# Patient Record
Sex: Female | Born: 1935
Health system: Southern US, Community
[De-identification: ages and names within clinical notes are randomized; demographics above are authoritative.]

## PROBLEM LIST (undated history)

## (undated) DIAGNOSIS — E7800 Pure hypercholesterolemia, unspecified: Secondary | ICD-10-CM

## (undated) DIAGNOSIS — C801 Malignant (primary) neoplasm, unspecified: Secondary | ICD-10-CM

## (undated) DIAGNOSIS — E559 Vitamin D deficiency, unspecified: Secondary | ICD-10-CM

## (undated) DIAGNOSIS — H3530 Unspecified macular degeneration: Secondary | ICD-10-CM

## (undated) DIAGNOSIS — Z87898 Personal history of other specified conditions: Secondary | ICD-10-CM

## (undated) DIAGNOSIS — IMO0001 Reserved for inherently not codable concepts without codable children: Secondary | ICD-10-CM

## (undated) DIAGNOSIS — H9190 Unspecified hearing loss, unspecified ear: Secondary | ICD-10-CM

## (undated) DIAGNOSIS — M810 Age-related osteoporosis without current pathological fracture: Secondary | ICD-10-CM

## (undated) DIAGNOSIS — M1990 Unspecified osteoarthritis, unspecified site: Secondary | ICD-10-CM

## (undated) HISTORY — DX: Unspecified macular degeneration: H35.30

## (undated) HISTORY — DX: Pure hypercholesterolemia, unspecified: E78.00

## (undated) HISTORY — DX: Vitamin D deficiency, unspecified: E55.9

## (undated) HISTORY — PX: OTHER SURGICAL HISTORY: SHX169

---

## 1962-05-27 HISTORY — PX: APPENDECTOMY: SHX54

## 1969-05-27 HISTORY — PX: ABDOMINAL HYSTERECTOMY: SHX81

## 1991-05-28 HISTORY — PX: BACK SURGERY: SHX140

## 1998-03-02 ENCOUNTER — Other Ambulatory Visit: Admission: RE | Admit: 1998-03-02 | Discharge: 1998-03-02 | Payer: Self-pay | Admitting: Obstetrics and Gynecology

## 1999-03-14 ENCOUNTER — Other Ambulatory Visit: Admission: RE | Admit: 1999-03-14 | Discharge: 1999-03-14 | Payer: Self-pay | Admitting: Obstetrics and Gynecology

## 2000-01-02 ENCOUNTER — Encounter: Admission: RE | Admit: 2000-01-02 | Discharge: 2000-01-02 | Payer: Self-pay | Admitting: Obstetrics and Gynecology

## 2000-01-02 ENCOUNTER — Encounter: Payer: Self-pay | Admitting: Obstetrics and Gynecology

## 2000-03-13 ENCOUNTER — Encounter: Admission: RE | Admit: 2000-03-13 | Discharge: 2000-03-13 | Payer: Self-pay | Admitting: Obstetrics and Gynecology

## 2000-03-13 ENCOUNTER — Other Ambulatory Visit: Admission: RE | Admit: 2000-03-13 | Discharge: 2000-03-13 | Payer: Self-pay | Admitting: Obstetrics and Gynecology

## 2000-03-13 ENCOUNTER — Encounter: Payer: Self-pay | Admitting: Obstetrics and Gynecology

## 2000-04-01 ENCOUNTER — Encounter: Payer: Self-pay | Admitting: Otolaryngology

## 2000-04-01 ENCOUNTER — Encounter: Admission: RE | Admit: 2000-04-01 | Discharge: 2000-04-01 | Payer: Self-pay | Admitting: Otolaryngology

## 2001-02-05 ENCOUNTER — Encounter: Admission: RE | Admit: 2001-02-05 | Discharge: 2001-02-05 | Payer: Self-pay | Admitting: Obstetrics and Gynecology

## 2001-02-05 ENCOUNTER — Encounter: Payer: Self-pay | Admitting: Obstetrics and Gynecology

## 2001-07-02 ENCOUNTER — Other Ambulatory Visit: Admission: RE | Admit: 2001-07-02 | Discharge: 2001-07-02 | Payer: Self-pay | Admitting: Gynecology

## 2002-02-02 ENCOUNTER — Encounter: Payer: Self-pay | Admitting: Gynecology

## 2002-02-02 ENCOUNTER — Encounter: Admission: RE | Admit: 2002-02-02 | Discharge: 2002-02-02 | Payer: Self-pay | Admitting: Gynecology

## 2002-12-02 ENCOUNTER — Encounter: Admission: RE | Admit: 2002-12-02 | Discharge: 2002-12-02 | Payer: Self-pay | Admitting: Gynecology

## 2002-12-02 ENCOUNTER — Encounter: Payer: Self-pay | Admitting: Gynecology

## 2003-03-07 ENCOUNTER — Encounter: Admission: RE | Admit: 2003-03-07 | Discharge: 2003-03-07 | Payer: Self-pay | Admitting: Gynecology

## 2003-03-07 ENCOUNTER — Encounter: Payer: Self-pay | Admitting: Gynecology

## 2003-08-23 ENCOUNTER — Encounter: Admission: RE | Admit: 2003-08-23 | Discharge: 2003-11-21 | Payer: Self-pay | Admitting: Cardiology

## 2004-03-23 ENCOUNTER — Encounter: Admission: RE | Admit: 2004-03-23 | Discharge: 2004-03-23 | Payer: Self-pay | Admitting: Gynecology

## 2004-10-03 ENCOUNTER — Other Ambulatory Visit: Admission: RE | Admit: 2004-10-03 | Discharge: 2004-10-03 | Payer: Self-pay | Admitting: Gynecology

## 2005-04-24 ENCOUNTER — Encounter: Payer: Self-pay | Admitting: Gynecology

## 2006-04-25 ENCOUNTER — Encounter: Admission: RE | Admit: 2006-04-25 | Discharge: 2006-04-25 | Payer: Self-pay | Admitting: Gynecology

## 2006-05-05 ENCOUNTER — Encounter: Admission: RE | Admit: 2006-05-05 | Discharge: 2006-05-05 | Payer: Self-pay | Admitting: Gynecology

## 2007-04-27 ENCOUNTER — Encounter: Admission: RE | Admit: 2007-04-27 | Discharge: 2007-04-27 | Payer: Self-pay | Admitting: Gynecology

## 2008-01-18 ENCOUNTER — Other Ambulatory Visit: Admission: RE | Admit: 2008-01-18 | Discharge: 2008-01-18 | Payer: Self-pay | Admitting: Gynecology

## 2008-01-21 ENCOUNTER — Encounter: Admission: RE | Admit: 2008-01-21 | Discharge: 2008-01-21 | Payer: Self-pay | Admitting: Gynecology

## 2008-05-03 ENCOUNTER — Encounter: Admission: RE | Admit: 2008-05-03 | Discharge: 2008-05-03 | Payer: Self-pay | Admitting: Gynecology

## 2008-05-12 ENCOUNTER — Encounter: Admission: RE | Admit: 2008-05-12 | Discharge: 2008-05-12 | Payer: Self-pay | Admitting: Gynecology

## 2008-07-21 ENCOUNTER — Encounter: Admission: RE | Admit: 2008-07-21 | Discharge: 2008-07-21 | Payer: Self-pay | Admitting: Cardiology

## 2009-04-13 DIAGNOSIS — M159 Polyosteoarthritis, unspecified: Secondary | ICD-10-CM | POA: Insufficient documentation

## 2009-04-13 DIAGNOSIS — E7800 Pure hypercholesterolemia, unspecified: Secondary | ICD-10-CM | POA: Insufficient documentation

## 2009-04-13 DIAGNOSIS — K219 Gastro-esophageal reflux disease without esophagitis: Secondary | ICD-10-CM

## 2009-04-13 DIAGNOSIS — H81319 Aural vertigo, unspecified ear: Secondary | ICD-10-CM | POA: Insufficient documentation

## 2009-04-13 DIAGNOSIS — M1990 Unspecified osteoarthritis, unspecified site: Secondary | ICD-10-CM | POA: Insufficient documentation

## 2009-04-13 HISTORY — DX: Gastro-esophageal reflux disease without esophagitis: K21.9

## 2009-05-22 ENCOUNTER — Encounter: Admission: RE | Admit: 2009-05-22 | Discharge: 2009-05-22 | Payer: Self-pay | Admitting: Gynecology

## 2010-02-21 ENCOUNTER — Ambulatory Visit: Payer: Self-pay | Admitting: Cardiology

## 2010-03-09 ENCOUNTER — Encounter: Admission: RE | Admit: 2010-03-09 | Discharge: 2010-03-09 | Payer: Self-pay | Admitting: Cardiology

## 2010-05-29 ENCOUNTER — Encounter
Admission: RE | Admit: 2010-05-29 | Discharge: 2010-05-29 | Payer: Self-pay | Source: Home / Self Care | Attending: Gynecology | Admitting: Gynecology

## 2010-06-08 ENCOUNTER — Encounter
Admission: RE | Admit: 2010-06-08 | Discharge: 2010-06-08 | Payer: Self-pay | Source: Home / Self Care | Attending: Gynecology | Admitting: Gynecology

## 2010-06-17 ENCOUNTER — Encounter: Payer: Self-pay | Admitting: Gynecology

## 2010-06-30 ENCOUNTER — Encounter: Payer: Self-pay | Admitting: Cardiology

## 2010-08-23 ENCOUNTER — Ambulatory Visit: Payer: Self-pay | Admitting: Cardiology

## 2010-08-30 ENCOUNTER — Encounter: Payer: Self-pay | Admitting: Cardiology

## 2010-08-30 ENCOUNTER — Ambulatory Visit (INDEPENDENT_AMBULATORY_CARE_PROVIDER_SITE_OTHER): Payer: BC Managed Care – PPO | Admitting: Cardiology

## 2010-08-30 VITALS — BP 116/78 | HR 76 | Wt 122.0 lb

## 2010-08-30 DIAGNOSIS — J45909 Unspecified asthma, uncomplicated: Secondary | ICD-10-CM | POA: Insufficient documentation

## 2010-08-30 DIAGNOSIS — E7800 Pure hypercholesterolemia, unspecified: Secondary | ICD-10-CM

## 2010-08-30 DIAGNOSIS — H3530 Unspecified macular degeneration: Secondary | ICD-10-CM

## 2010-08-30 LAB — LIPID PANEL
Cholesterol: 153 mg/dL (ref 0–200)
HDL: 57.9 mg/dL (ref >39.00)
Total CHOL/HDL Ratio: 3
Triglycerides: 54 mg/dL (ref 0.0–149.0)

## 2010-08-30 LAB — HEPATIC FUNCTION PANEL
Bilirubin, Direct: 0.2 mg/dL (ref 0.0–0.3)
Total Protein: 6.4 g/dL (ref 6.0–8.3)

## 2010-08-30 LAB — BASIC METABOLIC PANEL
BUN: 24 mg/dL — ABNORMAL HIGH (ref 6–23)
CO2: 28 mEq/L (ref 19–32)
Calcium: 9.4 mg/dL (ref 8.4–10.5)
Creatinine, Ser: 0.6 mg/dL (ref 0.4–1.2)
Glucose, Bld: 87 mg/dL (ref 70–99)

## 2010-08-30 NOTE — Assessment & Plan Note (Signed)
The patient has deterioration of her vision because of her macular degeneration.  So far she has dry macular degeneration.  She is on ICaps and she is followed closely by Dr. Eulah Pont.

## 2010-08-30 NOTE — Assessment & Plan Note (Signed)
Patient has had no recent episodes of severe asthma or breathing difficulty and she has not been experiencing any chest pain.

## 2010-08-30 NOTE — Assessment & Plan Note (Signed)
This patient has a history of elevated cholesterol.  She has been on long-term statins with Lipitor 10 mg daily.  She's not having any side effects from Lipitor.  She continues to walk 2-3 miles a day for exercise and she watches her weight and her diet.

## 2010-08-30 NOTE — Progress Notes (Signed)
HPI:  This pleasant 76 year old woman is seen for a six-month followup office visit.  He has just been in generally good health.  She does have a history of hypercholesterolemia.  She also has a history of mild asthma and wheezing and takes Singulair on a p.r.n. Basis.  She's also had problems with her vision because of macular degeneration.  However she is still able to drive.  Current Outpatient Prescriptions  Medication Sig Dispense Refill  . ALBUTEROL IN Inhale into the lungs as needed.        Marland Kitchen aspirin 81 MG tablet Take 81 mg by mouth daily. Taking 3 times a week      . atorvastatin (LIPITOR) 10 MG tablet Take 10 mg by mouth daily.        . calcium carbonate (CALCIUM 600) 600 MG TABS Take 600 mg by mouth 2 (two) times daily with meals.        . Cholecalciferol (VITAMIN D) 1000 UNITS capsule Take 1,000 Units by mouth daily.        . Fluticasone-Salmeterol (ADVAIR DISKUS) 100-50 MCG/DOSE AEPB Inhale 1 puff into the lungs as needed.        . montelukast (SINGULAIR) 10 MG tablet Take 10 mg by mouth at bedtime.        . Multiple Vitamins-Minerals (ICAPS PO) Take by mouth 2 (two) times daily.        . naproxen sodium (ANAPROX) 220 MG tablet Take 220 mg by mouth as needed.        Marland Kitchen buPROPion (ZYBAN) 150 MG 12 hr tablet Take 150 mg by mouth 2 (two) times daily.        . Multiple Vitamins-Minerals (ICAPS PO) Take by mouth daily.          Allergies  Allergen Reactions  . Codeine   . Penicillins     Patient Active Problem List  Diagnoses  . Hypercholesterolemia  . Asthma  . Macular degeneration    History  Smoking status  . Never Smoker   Smokeless tobacco  . Not on file    History  Alcohol Use: Not on file    No family history on file.  Review of Systems: The patient denies any heat or cold intolerance.  No weight gain or weight loss.  The patient denies headaches or blurry vision.  There is no cough or sputum production.  The patient denies dizziness.  There is no hematuria  or hematochezia.  The patient denies any muscle aches or arthritis.  The patient denies any rash.  The patient denies frequent falling or instability.  There is no history of depression or anxiety.  All other systems were reviewed and are negative.   Physical Exam: Filed Vitals:   08/30/10 0925  BP: 116/78  Pulse: 76  Weight is 122, up 3 pounds.  The general appearance reveals a thin middle-aged woman in no distress.Pupils equal and reactive.   Extraocular Movements are full.  There is no scleral icterus.  The mouth and pharynx are normal.  The neck is supple.  The carotids reveal no bruits.  The jugular venous pressure is normal.  The thyroid is not enlarged.  There is no lymphadenopathy.The chest is clear to percussion and auscultation. There are no rales or rhonchi. Expansion of the chest is symmetrical.The precordium is quiet.  The first heart sound is normal.  The second heart sound is physiologically split.  There is no murmur gallop rub or click.  There is no abnormal  lift or heave.The abdomen is soft and nontender. Bowel sounds are normal. The liver and spleen are not enlarged. There Are no abdominal masses. There are no bruits.The pedal pulses are good.  There is no phlebitis or edema.  There is no cyanosis or clubbing.Strength is normal and symmetrical in all extremities.  There is no lateralizing weakness.  There are no sensory deficits.    Assessment / Plan:

## 2010-09-05 ENCOUNTER — Other Ambulatory Visit: Payer: Self-pay | Admitting: *Deleted

## 2010-09-05 ENCOUNTER — Telehealth: Payer: Self-pay | Admitting: *Deleted

## 2010-09-05 DIAGNOSIS — E785 Hyperlipidemia, unspecified: Secondary | ICD-10-CM

## 2010-09-05 MED ORDER — ATORVASTATIN CALCIUM 10 MG PO TABS: 10.0000 mg | ORAL_TABLET | Freq: Every day | ORAL | Status: DC

## 2010-09-05 NOTE — Telephone Encounter (Signed)
Lab work reported and Lipitor refilled

## 2010-11-08 ENCOUNTER — Encounter: Payer: Self-pay | Admitting: Cardiology

## 2011-02-14 ENCOUNTER — Other Ambulatory Visit: Payer: Self-pay | Admitting: Gynecology

## 2011-02-14 DIAGNOSIS — N63 Unspecified lump in unspecified breast: Secondary | ICD-10-CM

## 2011-02-22 ENCOUNTER — Ambulatory Visit
Admission: RE | Admit: 2011-02-22 | Discharge: 2011-02-22 | Disposition: A | Discharge status: Home / Self Care | Payer: Medicare Other | Source: Ambulatory Visit | Attending: Gynecology | Admitting: Gynecology

## 2011-02-22 DIAGNOSIS — N63 Unspecified lump in unspecified breast: Secondary | ICD-10-CM

## 2011-03-21 ENCOUNTER — Other Ambulatory Visit: Payer: Self-pay | Admitting: Cardiology

## 2011-03-21 NOTE — Telephone Encounter (Signed)
Refilled lipitor 

## 2011-04-16 ENCOUNTER — Other Ambulatory Visit: Payer: Self-pay | Admitting: Gynecology

## 2011-04-16 DIAGNOSIS — Z1231 Encounter for screening mammogram for malignant neoplasm of breast: Secondary | ICD-10-CM

## 2011-04-22 ENCOUNTER — Ambulatory Visit: Payer: BC Managed Care – PPO | Admitting: Cardiology

## 2011-04-22 ENCOUNTER — Other Ambulatory Visit: Payer: BC Managed Care – PPO | Admitting: *Deleted

## 2011-05-07 ENCOUNTER — Other Ambulatory Visit: Payer: Medicare Other | Admitting: *Deleted

## 2011-05-07 ENCOUNTER — Encounter: Payer: Self-pay | Admitting: Cardiology

## 2011-05-07 ENCOUNTER — Ambulatory Visit (INDEPENDENT_AMBULATORY_CARE_PROVIDER_SITE_OTHER): Payer: Medicare Other | Admitting: Cardiology

## 2011-05-07 VITALS — BP 108/64 | HR 64 | Ht 65.0 in | Wt 120.0 lb

## 2011-05-07 DIAGNOSIS — J45909 Unspecified asthma, uncomplicated: Secondary | ICD-10-CM

## 2011-05-07 DIAGNOSIS — E7800 Pure hypercholesterolemia, unspecified: Secondary | ICD-10-CM

## 2011-05-07 DIAGNOSIS — M1990 Unspecified osteoarthritis, unspecified site: Secondary | ICD-10-CM

## 2011-05-07 DIAGNOSIS — H3530 Unspecified macular degeneration: Secondary | ICD-10-CM

## 2011-05-07 LAB — HEPATIC FUNCTION PANEL
ALT: 14 U/L (ref 0–35)
Bilirubin, Direct: 0.1 mg/dL (ref 0.0–0.3)
Total Bilirubin: 0.8 mg/dL (ref 0.3–1.2)

## 2011-05-07 LAB — BASIC METABOLIC PANEL
BUN: 16 mg/dL (ref 6–23)
CO2: 28 mEq/L (ref 19–32)
Chloride: 105 mEq/L (ref 96–112)
GFR: 91.45 mL/min (ref >60.00)
Glucose, Bld: 89 mg/dL (ref 70–99)
Potassium: 4 mEq/L (ref 3.5–5.1)
Sodium: 141 mEq/L (ref 135–145)

## 2011-05-07 LAB — LIPID PANEL
LDL Cholesterol: 63 mg/dL (ref 0–99)
Total CHOL/HDL Ratio: 2
Triglycerides: 64 mg/dL (ref 0.0–149.0)

## 2011-05-07 NOTE — Progress Notes (Signed)
Darlene Serrano Date of Birth:  November 07, 1934 [facility] [address]   [address] [place], [place]  [address] [phone]           Fax   [phone]  HPI: This pleasant 76 year old woman is seen for a scheduled 6 month followup office visit.  She has a history of hypercholesterolemia he has a history of mild asthma.  He has had significant problems with her vision because of macular degeneration.  Because of worsening in her left eye since her last visit she is no longer able to drive.  Her macular degeneration in the left eye he has become wet macular degeneration and she is now receiving injections into the eye every 5 weeks and she has had a dramatic improvement in her vision since the injections began.  She is hopeful that soon she will be able to start driving her car again.  Current Outpatient Prescriptions  Medication Sig Dispense Refill  . aspirin 81 MG tablet Take 81 mg by mouth daily. Taking 1 times a week      . atorvastatin (LIPITOR) 10 MG tablet TAKE 1 TABLET BY MOUTH DAILY.  90 tablet  3  . calcium carbonate (CALCIUM 600) 600 MG TABS Take 600 mg by mouth 2 (two) times daily with a meal.        . Cholecalciferol (VITAMIN D) 1000 UNITS capsule Take 1,000 Units by mouth daily.        . Multiple Vitamins-Minerals (ICAPS PO) Take by mouth 2 (two) times daily.        . naproxen sodium (ANAPROX) 220 MG tablet Take 220 mg by mouth as needed.        Marland Kitchen BOOSTRIX 5-2.5-18.5 injection         Allergies  Allergen Reactions  . Codeine   . Penicillins     Patient Active Problem List  Diagnoses  . Hypercholesterolemia  . Asthma  . Macular degeneration    History  Smoking status  . Never Smoker   Smokeless tobacco  . Not on file    History  Alcohol Use: Not on file    No family history on file.  Review of Systems: The patient denies any heat or cold intolerance.  No weight gain or weight loss.  The patient denies headaches or blurry vision.   There is no cough or sputum production.  The patient denies dizziness.  There is no hematuria or hematochezia.  The patient denies any muscle aches or arthritis.  The patient denies any rash.  The patient denies frequent falling or instability.  There is no history of depression or anxiety.  All other systems were reviewed and are negative.   Physical Exam: Filed Vitals:   05/07/11 1054  BP: 108/64  Pulse: 64   the general appearance reveals a well-developed well-nourished woman in no distress.Pupils equal and reactive.   Extraocular Movements are full.  There is no scleral icterus.  The mouth and pharynx are normal.  The neck is supple.  The carotids reveal no bruits.  The jugular venous pressure is normal.  The thyroid is not enlarged.  There is no lymphadenopathy.  The chest is clear to percussion and auscultation. There are no rales or rhonchi. Expansion of the chest is symmetrical.  The precordium is quiet.  The first heart sound is normal.  The second heart sound is physiologically split.  There is no murmur gallop rub or click.  There is no abnormal lift  or heave.  The abdomen is soft and nontender. Bowel sounds are normal. The liver and spleen are not enlarged. There Are no abdominal masses. There are no bruits.  The pedal pulses are good.  There is no phlebitis or edema.  There is no cyanosis or clubbing. Strength is normal and symmetrical in all extremities.  There is no lateralizing weakness.  There are no sensory deficits.  The skin is warm and dry.  There is no rash.  EKG today shows normal sinus rhythm and is within normal limits.    Assessment / Plan: Continue same medication.  Recheck in 6 months for office visit lipid panel and chemistries.  Blood work today is pending.

## 2011-05-07 NOTE — Assessment & Plan Note (Signed)
The patient has not had any episodes of chest pain or severe dyspnea.  She still walks regularly for exercise.  She has not been aware of any palpitations.  She's had no dizziness or syncope

## 2011-05-07 NOTE — Assessment & Plan Note (Signed)
The patient has a history of high cholesterol.  She is on Lipitor 10 mg daily.  He is not having any side effects from Lipitor.

## 2011-05-07 NOTE — Assessment & Plan Note (Signed)
The patient is so full that her left eye will continue to improve.  She has had a dramatic improvement so far from the injections from her ophthalmologist Dr. Champ Mungo.

## 2011-05-07 NOTE — Patient Instructions (Signed)
Your physician recommends that you continue on your current medications as directed. Please refer to the Current Medication list given to you today.  Your physician wants you to follow-up in: 6 months. You will receive a reminder letter in the mail two months in advance. If you don't receive a letter, please call our office to schedule the follow-up appointment.  

## 2011-05-09 ENCOUNTER — Telehealth: Payer: Self-pay | Admitting: *Deleted

## 2011-05-09 NOTE — Telephone Encounter (Signed)
Advised of labs 

## 2011-05-09 NOTE — Telephone Encounter (Signed)
Message copied by Burnell Blanks on Thu May 09, 2011  2:12 PM ------      Message from: Cassell Clement      Created: Tue May 07, 2011  6:25 PM       Please report.  The labs are stable.  Continue same meds.  Continue careful diet.      The lipids are better than last time.

## 2011-07-01 ENCOUNTER — Ambulatory Visit: Payer: Medicare Other

## 2011-11-06 ENCOUNTER — Ambulatory Visit: Payer: Self-pay | Admitting: Ophthalmology

## 2011-11-26 ENCOUNTER — Ambulatory Visit: Payer: Self-pay | Admitting: Ophthalmology

## 2012-03-31 ENCOUNTER — Encounter: Payer: Self-pay | Admitting: Cardiology

## 2012-03-31 ENCOUNTER — Ambulatory Visit (INDEPENDENT_AMBULATORY_CARE_PROVIDER_SITE_OTHER): Payer: Medicare Other | Admitting: Cardiology

## 2012-03-31 ENCOUNTER — Other Ambulatory Visit: Payer: Self-pay

## 2012-03-31 VITALS — BP 106/65 | HR 70 | Resp 18 | Ht 65.0 in | Wt 125.0 lb

## 2012-03-31 DIAGNOSIS — E7800 Pure hypercholesterolemia, unspecified: Secondary | ICD-10-CM

## 2012-03-31 DIAGNOSIS — Z1231 Encounter for screening mammogram for malignant neoplasm of breast: Secondary | ICD-10-CM

## 2012-03-31 DIAGNOSIS — R42 Dizziness and giddiness: Secondary | ICD-10-CM

## 2012-03-31 LAB — HEPATIC FUNCTION PANEL
ALT: 14 U/L (ref 0–35)
Bilirubin, Direct: 0.1 mg/dL (ref 0.0–0.3)
Total Bilirubin: 0.9 mg/dL (ref 0.3–1.2)

## 2012-03-31 LAB — LIPID PANEL
HDL: 62.7 mg/dL (ref >39.00)
LDL Cholesterol: 73 mg/dL (ref 0–99)
VLDL: 11.4 mg/dL (ref 0.0–40.0)

## 2012-03-31 LAB — CBC WITH DIFFERENTIAL/PLATELET
Eosinophils Relative: 3.6 % (ref 0.0–5.0)
HCT: 38.6 % (ref 36.0–46.0)
Hemoglobin: 12.5 g/dL (ref 12.0–15.0)
Lymphs Abs: 1.3 10*3/uL (ref 0.7–4.0)
MCV: 96.6 fl (ref 78.0–100.0)
Monocytes Absolute: 0.3 10*3/uL (ref 0.1–1.0)
Monocytes Relative: 6.8 % (ref 3.0–12.0)
Neutro Abs: 2.1 10*3/uL (ref 1.4–7.7)
WBC: 3.9 10*3/uL — ABNORMAL LOW (ref 4.5–10.5)

## 2012-03-31 LAB — BASIC METABOLIC PANEL
Chloride: 104 mEq/L (ref 96–112)
GFR: 88.19 mL/min (ref >60.00)
Glucose, Bld: 87 mg/dL (ref 70–99)
Potassium: 4 mEq/L (ref 3.5–5.1)
Sodium: 138 mEq/L (ref 135–145)

## 2012-03-31 NOTE — Patient Instructions (Addendum)
Your physician recommends that you continue on your current medications as directed. Please refer to the Current Medication list given to you today.  Your physician wants you to follow-up in: 6 months with fasting labs (lp/bmet/hfp)  You will receive a reminder letter in the mail two months in advance. If you don't receive a letter, please call our office to schedule the follow-up appointment.   Will obtain labs today and call you with the results (lp/bmet/hfp/cbc) 

## 2012-03-31 NOTE — Progress Notes (Signed)
Nell Range Date of Birth:  12-08-1934 [facility] [address] [place], [place]  [address] [phone]  Fax   [phone]  HPI: This pleasant 77 year old woman is seen for a scheduled 6 month followup office visit. She has a history of hypercholesterolemia and she has a history of mild asthma. He has had significant problems with her vision because of macular degeneration.  Since last visit she has been feeling well.  She has had some mild episodes of dizziness about a month ago.  There was no aspect of vertigo.  It was a lightheaded feeling.   Current Outpatient Prescriptions  Medication Sig Dispense Refill  . atorvastatin (LIPITOR) 10 MG tablet TAKE 1 TABLET BY MOUTH DAILY.  90 tablet  3  . calcium carbonate (CALCIUM 600) 600 MG TABS Take 600 mg by mouth 2 (two) times daily with a meal.        . Cholecalciferol (VITAMIN D) 1000 UNITS capsule Take 1,000 Units by mouth daily.        . Multiple Vitamins-Minerals (ICAPS PO) Take by mouth 2 (two) times daily.        . naproxen sodium (ANAPROX) 220 MG tablet Take 220 mg by mouth as needed.        Marland Kitchen BOOSTRIX 5-2.5-18.5 injection         Allergies  Allergen Reactions  . Codeine   . Penicillins     Patient Active Problem List  Diagnosis  . Hypercholesterolemia  . Asthma  . Macular degeneration    History  Smoking status  . Never Smoker   Smokeless tobacco  . Not on file    History  Alcohol Use: Not on file    No family history on file.  Review of Systems: The patient denies any heat or cold intolerance.  No weight gain or weight loss.  The patient denies headaches or blurry vision.  There is no cough or sputum production.  The patient denies dizziness.  There is no hematuria or hematochezia.  The patient denies any muscle aches or arthritis.  The patient denies any rash.  The patient denies frequent falling or instability.  There is no history of depression or anxiety.  All other systems were  reviewed and are negative.   Physical Exam: Filed Vitals:   03/31/12 1020  BP: 106/65  Pulse: 70  Resp: 18   the general appearance reveals a well-developed well-nourished woman in no distress.The head and neck exam reveals pupils equal and reactive.  Extraocular movements are full.  There is no scleral icterus.  The mouth and pharynx are normal.  The neck is supple.  The carotids reveal no bruits.  The jugular venous pressure is normal.  The  thyroid is not enlarged.  There is no lymphadenopathy.  The chest is clear to percussion and auscultation.  There are no rales or rhonchi.  Expansion of the chest is symmetrical.  The precordium is quiet.  The first heart sound is normal.  The second heart sound is physiologically split.  There is no murmur gallop rub or click.  There is no abnormal lift or heave.  The abdomen is soft and nontender.  The bowel sounds are normal.  The liver and spleen are not enlarged.  There are no abdominal masses.  There are no abdominal bruits.  Extremities reveal good pedal pulses.  There is no phlebitis or edema.  There is no cyanosis or clubbing.  Strength is normal and symmetrical in all extremities.  There is no lateralizing weakness.  There are no sensory deficits.  The skin is warm and dry.  There is no rash.  EKG today shows normal sinus rhythm and is within normal limits    Assessment / Plan: Continue on same medication.  We will also check a CBC today because of her prior complaints of lightheadedness.  She'll return in 6 months for followup office visit lipid panel hepatic function panel and basal metabolic panel.

## 2012-03-31 NOTE — Progress Notes (Signed)
Quick Note:  Please report to patient. The recent labs are stable. Continue same medication and careful diet. ______ 

## 2012-03-31 NOTE — Assessment & Plan Note (Signed)
The patient is on Lipitor 10 mg daily.  She's having no myalgias.  Blood work today is pending.  Her weight is up 5 pounds since last visit.  However she is exercising on a regular basis at the gym.  She denies any exertional chest pain or shortness of breath.  No palpitations.

## 2012-04-01 ENCOUNTER — Telehealth: Payer: Self-pay | Admitting: *Deleted

## 2012-04-01 NOTE — Telephone Encounter (Signed)
Advised patient of lab results  

## 2012-04-01 NOTE — Telephone Encounter (Signed)
Message copied by Burnell Blanks on Wed Apr 01, 2012  2:36 PM ------      Message from: Cassell Clement      Created: Tue Mar 31, 2012  9:16 PM       Please report to patient.  The recent labs are stable. Continue same medication and careful diet.

## 2012-04-24 ENCOUNTER — Other Ambulatory Visit: Payer: Self-pay

## 2012-04-24 MED ORDER — ATORVASTATIN CALCIUM 10 MG PO TABS: 10.0000 mg | ORAL_TABLET | Freq: Every day | ORAL | Status: DC

## 2012-05-27 HISTORY — PX: OTHER SURGICAL HISTORY: SHX169

## 2012-07-13 ENCOUNTER — Ambulatory Visit: Payer: Self-pay | Admitting: Obstetrics & Gynecology

## 2012-09-30 ENCOUNTER — Ambulatory Visit (INDEPENDENT_AMBULATORY_CARE_PROVIDER_SITE_OTHER): Payer: Medicare Other | Admitting: Cardiology

## 2012-09-30 ENCOUNTER — Encounter: Payer: Self-pay | Admitting: Cardiology

## 2012-09-30 ENCOUNTER — Telehealth: Payer: Self-pay | Admitting: *Deleted

## 2012-09-30 VITALS — BP 138/51 | HR 66 | Ht 65.0 in | Wt 121.4 lb

## 2012-09-30 DIAGNOSIS — E7800 Pure hypercholesterolemia, unspecified: Secondary | ICD-10-CM

## 2012-09-30 DIAGNOSIS — R002 Palpitations: Secondary | ICD-10-CM | POA: Insufficient documentation

## 2012-09-30 HISTORY — DX: Palpitations: R00.2

## 2012-09-30 LAB — HEPATIC FUNCTION PANEL
ALT: 13 U/L (ref 0–35)
AST: 21 U/L (ref 0–37)
Albumin: 4.1 g/dL (ref 3.5–5.2)

## 2012-09-30 LAB — BASIC METABOLIC PANEL
Calcium: 9.3 mg/dL (ref 8.4–10.5)
GFR: 79.99 mL/min (ref >60.00)
Sodium: 139 mEq/L (ref 135–145)

## 2012-09-30 LAB — LIPID PANEL
Cholesterol: 143 mg/dL (ref 0–200)
HDL: 57.4 mg/dL (ref >39.00)
Triglycerides: 60 mg/dL (ref 0.0–149.0)

## 2012-09-30 NOTE — Assessment & Plan Note (Signed)
One month ago the patient had an episode of tachycardia palpitations which occurred as she was preparing for bed.  She considered going to the emergency room.  Instead, she had her daughter who is a Garment/textile technologist check her and her daughter felt that she would be okay if she just stayed quiet and rested.  She went to sleep in a recliner when she woke up she was back to normal.  Since then she has had no recurrent symptoms.  Her last electrocardiogram was 6 months ago and was normal.  She will let us know of she has any further symptoms

## 2012-09-30 NOTE — Progress Notes (Signed)
Darlene Serrano Date of Birth:  May 02, 1935 [facility] [address] [place], [place]  [address] [phone]  Fax   [phone]  HPI: This pleasant 56 and in-year-old woman is seen for a scheduled 6 month followup office visit. She has a history of hypercholesterolemia and she has a history of mild asthma. He has had significant problems with her vision because of macular degeneration. Since last visit she has been feeling well..   Current Outpatient Prescriptions  Medication Sig Dispense Refill  . atorvastatin (LIPITOR) 10 MG tablet Take 1 tablet (10 mg total) by mouth daily.  90 tablet  3  . calcium carbonate (CALCIUM 600) 600 MG TABS Take 600 mg by mouth 2 (two) times daily with a meal.        . Cholecalciferol (VITAMIN D) 1000 UNITS capsule Take 1,000 Units by mouth daily.       . Multiple Vitamins-Minerals (ICAPS PO) Take by mouth 2 (two) times daily.        . naproxen sodium (ANAPROX) 220 MG tablet Take 220 mg by mouth as needed.        Marland Kitchen BOOSTRIX 5-2.5-18.5 injection        No current facility-administered medications for this visit.    Allergies  Allergen Reactions  . Codeine   . Penicillins     Patient Active Problem List   Diagnosis Date Noted  . Hypercholesterolemia 08/30/2010    Priority: Medium  . Asthma 08/30/2010  . Macular degeneration 08/30/2010    History  Smoking status  . Never Smoker   Smokeless tobacco  . Not on file    History  Alcohol Use: Not on file    No family history on file.  Review of Systems: The patient denies any heat or cold intolerance.  No weight gain or weight loss.  The patient denies headaches or blurry vision.  There is no cough or sputum production.  The patient denies dizziness.  There is no hematuria or hematochezia.  The patient denies any muscle aches or arthritis.  The patient denies any rash.  The patient denies frequent falling or instability.  There is no history of depression or anxiety.   All other systems were reviewed and are negative.   Physical Exam: Filed Vitals:   09/30/12 0903  BP: 138/51  Pulse: 66   the general appearance reveals a well-developed well-nourished woman in no distress.The head and neck exam reveals pupils equal and reactive.  Extraocular movements are full.  There is no scleral icterus.  The mouth and pharynx are normal.  The neck is supple.  The carotids reveal no bruits.  The jugular venous pressure is normal.  The  thyroid is not enlarged.  There is no lymphadenopathy.  The chest is clear to percussion and auscultation.  There are no rales or rhonchi.  Expansion of the chest is symmetrical.  The precordium is quiet.  The first heart sound is normal.  The second heart sound is physiologically split.  There is no murmur gallop rub or click.  There is no abnormal lift or heave.  The abdomen is soft and nontender.  The bowel sounds are normal.  The liver and spleen are not enlarged.  There are no abdominal masses.  There are no abdominal bruits.  Extremities reveal good pedal pulses.  There is no phlebitis or edema.  There is no cyanosis or clubbing.  Strength is normal and symmetrical in all extremities.  There is no lateralizing weakness.  There are no sensory deficits.  The skin is warm and dry.  There is no rash.      Assessment / Plan: Continue same medication.  We are checking fasting lab work today.  Recheck in 6 months for followup office visit EKG lipid panel hepatic function panel and basal metabolic panel.  She will let us know if she has any further tachycardia palpitations.  Her last episode occurred when she was under stress worrying about a questionable mammogram which eventually turned out okay.

## 2012-09-30 NOTE — Patient Instructions (Addendum)
Will obtain labs today and call you with the results (lp/bmet/hfp)  Your physician recommends that you continue on your current medications as directed. Please refer to the Current Medication list given to you today.  Your physician wants you to follow-up in: 6 months with fasting labs (lp/bmet/hfp) and EKG  You will receive a reminder letter in the mail two months in advance. If you don't receive a letter, please call our office to schedule the follow-up appointment.  

## 2012-09-30 NOTE — Telephone Encounter (Signed)
Opened in error

## 2012-09-30 NOTE — Progress Notes (Signed)
Quick Note:  Please report to patient. The recent labs are stable. Continue same medication and careful diet. ______ 

## 2012-09-30 NOTE — Assessment & Plan Note (Signed)
The patient has a history of hypercholesterolemia and is on Lipitor generic 10 mg daily.  She is tolerating without side effects.  We are checking lab work today

## 2012-11-30 ENCOUNTER — Telehealth: Payer: Self-pay | Admitting: Cardiology

## 2012-11-30 NOTE — Telephone Encounter (Signed)
Agree with plan 

## 2012-11-30 NOTE — Telephone Encounter (Signed)
I will route to Dr/nurse

## 2012-11-30 NOTE — Telephone Encounter (Signed)
Will discuss with  Dr. Brackbill tomorrow  

## 2012-11-30 NOTE — Telephone Encounter (Signed)
Needs letter sent to Darlene Serrano fax # [phone]

## 2012-11-30 NOTE — Telephone Encounter (Signed)
Wants a letter for her life insurance stating she is doing ok from a cardiac standpoint, PCP doing one as well.

## 2012-11-30 NOTE — Telephone Encounter (Signed)
New Problem  Pt needs a letter for her insurance company regarding her last visit.

## 2012-12-02 ENCOUNTER — Encounter: Payer: Self-pay | Admitting: Cardiology

## 2012-12-02 NOTE — Telephone Encounter (Signed)
Letter has been done will fax as requested after  Dr. Patty Sermons has signed

## 2013-04-13 ENCOUNTER — Other Ambulatory Visit: Payer: Self-pay

## 2013-04-13 ENCOUNTER — Ambulatory Visit (INDEPENDENT_AMBULATORY_CARE_PROVIDER_SITE_OTHER): Payer: Medicare Other | Admitting: Cardiology

## 2013-04-13 ENCOUNTER — Encounter: Payer: Self-pay | Admitting: Cardiology

## 2013-04-13 VITALS — BP 120/74 | HR 61 | Wt 122.1 lb

## 2013-04-13 DIAGNOSIS — M1990 Unspecified osteoarthritis, unspecified site: Secondary | ICD-10-CM

## 2013-04-13 DIAGNOSIS — E7800 Pure hypercholesterolemia, unspecified: Secondary | ICD-10-CM

## 2013-04-13 DIAGNOSIS — R002 Palpitations: Secondary | ICD-10-CM

## 2013-04-13 LAB — LIPID PANEL
HDL: 62.8 mg/dL (ref >39.00)
Triglycerides: 58 mg/dL (ref 0.0–149.0)

## 2013-04-13 LAB — BASIC METABOLIC PANEL
CO2: 30 mEq/L (ref 19–32)
Calcium: 9.6 mg/dL (ref 8.4–10.5)
Glucose, Bld: 87 mg/dL (ref 70–99)
Potassium: 4.1 mEq/L (ref 3.5–5.1)
Sodium: 140 mEq/L (ref 135–145)

## 2013-04-13 LAB — HEPATIC FUNCTION PANEL
ALT: 16 U/L (ref 0–35)
AST: 23 U/L (ref 0–37)
Albumin: 4.1 g/dL (ref 3.5–5.2)
Alkaline Phosphatase: 50 U/L (ref 39–117)
Total Bilirubin: 1 mg/dL (ref 0.3–1.2)

## 2013-04-13 NOTE — Progress Notes (Signed)
Nell Range Date of Birth:  03/01/1935 [address] [place], [place]  [address] [phone]  Fax   [phone]  HPI: This pleasant 26 and in-year-old woman is seen for a scheduled 6 month followup office visit. She has a history of hypercholesterolemia and she has a history of mild asthma.  She has a remote history of palpitations.  She has osteoarthritis of the right hip and is scheduled for total hip replacement on 06/30/13 by Dr.Aluisio.  Since last visit she's had no new cardiac symptoms.   Current Outpatient Prescriptions  Medication Sig Dispense Refill  . atorvastatin (LIPITOR) 10 MG tablet Take 1 tablet (10 mg total) by mouth daily.  90 tablet  3  . BOOSTRIX 5-2.5-18.5 injection       . calcium carbonate (CALCIUM 600) 600 MG TABS Take 600 mg by mouth 2 (two) times daily with a meal.        . Cholecalciferol (VITAMIN D) 1000 UNITS capsule Take 1,000 Units by mouth daily.       . Multiple Vitamins-Minerals (ICAPS PO) Take by mouth 2 (two) times daily.        . naproxen sodium (ANAPROX) 220 MG tablet Take 220 mg by mouth as needed.         No current facility-administered medications for this visit.    Allergies  Allergen Reactions  . Codeine   . Penicillins     Patient Active Problem List   Diagnosis Date Noted  . Hypercholesterolemia 08/30/2010    Priority: Medium  . Osteoarthritis (arthritis due to wear and tear of joints) 04/13/2013  . Rapid palpitations 09/30/2012  . Asthma 08/30/2010  . Macular degeneration 08/30/2010    History  Smoking status  . Never Smoker   Smokeless tobacco  . Not on file    History  Alcohol Use: Not on file    No family history on file.  Review of Systems: The patient denies any heat or cold intolerance.  No weight gain or weight loss.  The patient denies headaches or blurry vision.  There is no cough or sputum production.  The patient denies dizziness.  There is no hematuria or hematochezia.  The patient  denies any muscle aches or arthritis.  The patient denies any rash.  The patient denies frequent falling or instability.  There is no history of depression or anxiety.  All other systems were reviewed and are negative.   Physical Exam: Filed Vitals:   04/13/13 1052  BP: 120/74  Pulse: 61   the general appearance reveals a well-developed well-nourished woman in no distress.The head and neck exam reveals pupils equal and reactive.  Extraocular movements are full.  There is no scleral icterus.  The mouth and pharynx are normal.  The neck is supple.  The carotids reveal no bruits.  The jugular venous pressure is normal.  The  thyroid is not enlarged.  There is no lymphadenopathy.  The chest is clear to percussion and auscultation.  There are no rales or rhonchi.  Expansion of the chest is symmetrical.  The precordium is quiet.  The first heart sound is normal.  The second heart sound is physiologically split.  There is no murmur gallop rub or click.  There is no abnormal lift or heave.  The abdomen is soft and nontender.  The bowel sounds are normal.  The liver and spleen are not enlarged.  There are no abdominal masses.  There are no abdominal bruits.  Extremities reveal good pedal pulses.  There is no phlebitis or edema.  There is no cyanosis or clubbing.  Strength is normal and symmetrical in all extremities.  There is no lateralizing weakness.  There are no sensory deficits.  The skin is warm and dry.  There is no rash.   EKG today shows normal sinus rhythm and is within normal limits.   Assessment / Plan: Continue same medication.  The patient is cleared from a medical/cardiac standpoint for her upcoming hip surgery scheduled for 07/01/13. Recheck here in 6 months for followup office visit and fasting lab.

## 2013-04-13 NOTE — Progress Notes (Signed)
Quick Note:  Please report to patient. The recent labs are stable. Continue same medication and careful diet. ______ 

## 2013-04-13 NOTE — Assessment & Plan Note (Signed)
She has not had any reoccurrence of rapid palpitations over the past 6 months

## 2013-04-13 NOTE — Patient Instructions (Signed)
Will obtain labs today and call you with the results (lp/bmet/hfp)  Your physician recommends that you continue on your current medications as directed. Please refer to the Current Medication list given to you today.  Your physician wants you to follow-up in: 6 months with fasting labs (lp/bmet/hfp) You will receive a reminder letter in the mail two months in advance. If you don't receive a letter, please call our office to schedule the follow-up appointment.   You are cleared for surgery, will fax to [facility]

## 2013-04-13 NOTE — Assessment & Plan Note (Signed)
She has not had any flareup of asthma or respiratory symptoms

## 2013-04-13 NOTE — Assessment & Plan Note (Signed)
The patient remains on low-dose Lipitor 10 mg daily she is tolerating without myalgias or side effects.  Blood work today pending.

## 2013-04-14 ENCOUNTER — Telehealth: Payer: Self-pay | Admitting: Cardiology

## 2013-04-14 NOTE — Telephone Encounter (Signed)
New problem:  Pt is calling for recent test results.

## 2013-04-14 NOTE — Telephone Encounter (Signed)
Advised patient of lab results  

## 2013-04-14 NOTE — Telephone Encounter (Signed)
Received request from Nurse fax box, documents faxed for surgical clearance. To: [facility] Fax number: [phone] Attention: 04/14/13/KM

## 2013-04-14 NOTE — Telephone Encounter (Signed)
Message copied by Burnell Blanks on Wed Apr 14, 2013  1:52 PM ------      Message from: Cassell Clement      Created: Tue Apr 13, 2013  8:57 PM       Please report to patient.  The recent labs are stable. Continue same medication and careful diet. ------

## 2013-05-19 ENCOUNTER — Other Ambulatory Visit: Payer: Self-pay | Admitting: Cardiology

## 2013-06-11 ENCOUNTER — Other Ambulatory Visit: Payer: Self-pay | Admitting: Orthopedic Surgery

## 2013-06-21 ENCOUNTER — Encounter (HOSPITAL_COMMUNITY): Payer: Self-pay | Admitting: Pharmacy Technician

## 2013-06-23 ENCOUNTER — Ambulatory Visit (HOSPITAL_COMMUNITY)
Admission: RE | Admit: 2013-06-23 | Discharge: 2013-06-23 | Disposition: A | Discharge status: Home / Self Care | Payer: Medicare Other | Source: Ambulatory Visit | Attending: Orthopedic Surgery | Admitting: Orthopedic Surgery

## 2013-06-23 ENCOUNTER — Encounter (HOSPITAL_COMMUNITY)
Admission: RE | Admit: 2013-06-23 | Discharge: 2013-06-23 | Disposition: A | Discharge status: Home / Self Care | Payer: Medicare Other | Source: Ambulatory Visit | Attending: Orthopedic Surgery | Admitting: Orthopedic Surgery

## 2013-06-23 ENCOUNTER — Encounter (HOSPITAL_COMMUNITY): Payer: Self-pay

## 2013-06-23 DIAGNOSIS — M76899 Other specified enthesopathies of unspecified lower limb, excluding foot: Secondary | ICD-10-CM | POA: Insufficient documentation

## 2013-06-23 DIAGNOSIS — M169 Osteoarthritis of hip, unspecified: Secondary | ICD-10-CM | POA: Insufficient documentation

## 2013-06-23 DIAGNOSIS — Z0183 Encounter for blood typing: Secondary | ICD-10-CM | POA: Insufficient documentation

## 2013-06-23 DIAGNOSIS — Z01812 Encounter for preprocedural laboratory examination: Secondary | ICD-10-CM | POA: Insufficient documentation

## 2013-06-23 DIAGNOSIS — Z01818 Encounter for other preprocedural examination: Secondary | ICD-10-CM | POA: Insufficient documentation

## 2013-06-23 DIAGNOSIS — M1610 Unilateral primary osteoarthritis, unspecified hip: Secondary | ICD-10-CM | POA: Insufficient documentation

## 2013-06-23 DIAGNOSIS — M25859 Other specified joint disorders, unspecified hip: Secondary | ICD-10-CM | POA: Insufficient documentation

## 2013-06-23 HISTORY — DX: Age-related osteoporosis without current pathological fracture: M81.0

## 2013-06-23 HISTORY — DX: Malignant (primary) neoplasm, unspecified: C80.1

## 2013-06-23 HISTORY — DX: Reserved for inherently not codable concepts without codable children: IMO0001

## 2013-06-23 HISTORY — DX: Unspecified hearing loss, unspecified ear: H91.90

## 2013-06-23 HISTORY — DX: Unspecified osteoarthritis, unspecified site: M19.90

## 2013-06-23 HISTORY — DX: Personal history of other specified conditions: Z87.898

## 2013-06-23 LAB — PROTIME-INR
INR: 0.95 (ref 0.00–1.49)
PROTHROMBIN TIME: 12.5 s (ref 11.6–15.2)

## 2013-06-23 LAB — URINALYSIS, ROUTINE W REFLEX MICROSCOPIC
BILIRUBIN URINE: NEGATIVE
Glucose, UA: NEGATIVE mg/dL
HGB URINE DIPSTICK: NEGATIVE
Ketones, ur: NEGATIVE mg/dL
Nitrite: NEGATIVE
Protein, ur: NEGATIVE mg/dL
Specific Gravity, Urine: 1.007 (ref 1.005–1.030)
UROBILINOGEN UA: 0.2 mg/dL (ref 0.0–1.0)
pH: 6 (ref 5.0–8.0)

## 2013-06-23 LAB — COMPREHENSIVE METABOLIC PANEL
ALT: 11 U/L (ref 0–35)
AST: 21 U/L (ref 0–37)
Albumin: 4.1 g/dL (ref 3.5–5.2)
Alkaline Phosphatase: 63 U/L (ref 39–117)
BUN: 24 mg/dL — AB (ref 6–23)
CALCIUM: 9 mg/dL (ref 8.4–10.5)
CO2: 26 mEq/L (ref 19–32)
Chloride: 101 mEq/L (ref 96–112)
Creatinine, Ser: 0.76 mg/dL (ref 0.50–1.10)
GFR calc non Af Amer: 80 mL/min — ABNORMAL LOW (ref >90)
GLUCOSE: 86 mg/dL (ref 70–99)
Potassium: 4.5 mEq/L (ref 3.7–5.3)
Sodium: 138 mEq/L (ref 137–147)
Total Bilirubin: 0.5 mg/dL (ref 0.3–1.2)
Total Protein: 6.7 g/dL (ref 6.0–8.3)

## 2013-06-23 LAB — ABO/RH: ABO/RH(D): O POS

## 2013-06-23 LAB — CBC
HEMATOCRIT: 38.7 % (ref 36.0–46.0)
HEMOGLOBIN: 12.7 g/dL (ref 12.0–15.0)
MCH: 31.1 pg (ref 26.0–34.0)
MCHC: 32.8 g/dL (ref 30.0–36.0)
MCV: 94.9 fL (ref 78.0–100.0)
Platelets: 199 10*3/uL (ref 150–400)
RBC: 4.08 MIL/uL (ref 3.87–5.11)
RDW: 13.5 % (ref 11.5–15.5)
WBC: 6.1 10*3/uL (ref 4.0–10.5)

## 2013-06-23 LAB — SURGICAL PCR SCREEN
MRSA, PCR: NEGATIVE
Staphylococcus aureus: NEGATIVE

## 2013-06-23 LAB — URINE MICROSCOPIC-ADD ON

## 2013-06-23 LAB — APTT: APTT: 28 s (ref 24–37)

## 2013-06-23 NOTE — Patient Instructions (Addendum)
PER [place] FLU POLICY  - NO VISITORS UNDER 77 YEARS OF AGE.   YOUR SURGERY IS SCHEDULED AT [facility]  ON:  Wednesday  2/4  REPORT TO  SHORT STAY CENTER AT:  6:30 AM      PHONE # FOR SHORT STAY IS [phone]  DO NOT EAT OR DRINK ANYTHING AFTER MIDNIGHT THE NIGHT BEFORE YOUR SURGERY.  YOU MAY BRUSH YOUR TEETH, RINSE OUT YOUR MOUTH--BUT NO WATER, NO FOOD, NO CHEWING GUM, NO MINTS, NO CANDIES, NO CHEWING TOBACCO.  PLEASE TAKE THE FOLLOWING MEDICATIONS THE AM OF YOUR SURGERY WITH A FEW SIPS OF WATER:  ATORVASTATIN, ALLEGRA   DO NOT BRING VALUABLES, MONEY, CREDIT CARDS.  DO NOT WEAR JEWELRY, MAKE-UP, NAIL POLISH AND NO METAL PINS OR CLIPS IN YOUR HAIR. CONTACT LENS, DENTURES / PARTIALS, GLASSES SHOULD NOT BE WORN TO SURGERY AND IN MOST CASES-HEARING AIDS WILL NEED TO BE REMOVED.  BRING YOUR GLASSES CASE, ANY EQUIPMENT NEEDED FOR YOUR CONTACT LENS. FOR PATIENTS ADMITTED TO THE HOSPITAL--CHECK OUT TIME THE DAY OF DISCHARGE IS 11:00 AM.  ALL INPATIENT ROOMS ARE PRIVATE - WITH BATHROOM, TELEPHONE, TELEVISION AND WIFI INTERNET.                                                    PLEASE READ OVER ANY  FACT SHEETS THAT YOU WERE GIVEN: MRSA INFORMATION, BLOOD TRANSFUSION INFORMATION, INCENTIVE SPIROMETER INFORMATION.  FAILURE TO FOLLOW THESE INSTRUCTIONS MAY RESULT IN THE CANCELLATION OF YOUR SURGERY. PLEASE BE AWARE THAT YOU MAY NEED ADDITIONAL BLOOD DRAWN DAY OF YOUR SURGERY  PATIENT SIGNATURE_________________________________

## 2013-06-23 NOTE — Pre-Procedure Instructions (Signed)
EKG AND OFFICE NOTE IN EPIC FROM DR. BRACKBILL 04/13/13 AND NOTE OF MEDICAL CLEARANCE FOR RT HIP REPLACEMENT ON PT'S CHART FROM DR. BRACKBILL. CXR AND RT HIP XRAY WERE DONE TODAY - PREOP AT [facility].

## 2013-06-28 ENCOUNTER — Other Ambulatory Visit: Payer: Self-pay | Admitting: Orthopedic Surgery

## 2013-06-28 NOTE — H&P (Signed)
Darlene Serrano  DOB: 07/18/1934 Divorced / Language: English / Race: White Female  Date of Admission:  06-30-2013  Chief Complaint:  Right Hip Pain  History of Present Illness The patient is a 78 year old female who comes in for a preoperative History and Physical. The patient is scheduled for a right total hip arthroplasty (anterior approach) to be performed by Dr. Frank V. Aluisio, MD at [facility] on 06-30-2013. The patient is a 79 year old female who presents for follow up of their hip. The patient is being followed for their right hip pain and osteoarthritis. They are 6 year(s) out from the last visit. Symptoms reported today include: pain, while the patient does not report symptoms of: stiffness. Note for "Follow-up Hip": She said she walks 12 to 15 miles a week. She changed her shoes June of last year, and started doing the spinning bike at the local gym. She had a flare after the change in her activities. She did get better, after her last visit, with NSAIDS. She has known arthritis in her hip. She does well when she exercises. She had a span over the earlier part of this year where she had a marked increase in pain. She had a hard time getting around. She went back to her regular shoes and regular activities and has done better since. She still gets some groin pain and lateral hip pain but nothing like she did earlier. She is ready to proceed with the hip surgery. They have been treated conservatively in the past for the above stated problem and despite conservative measures, they continue to have progressive pain and severe functional limitations and dysfunction. They have failed non-operative management including home exercise, medications. It is felt that they would benefit from undergoing total joint replacement. Risks and benefits of the procedure have been discussed with the patient and they elect to proceed with surgery. There are no active contraindications to  surgery such as ongoing infection or rapidly progressive neurological disease.   Allergies Penicillin VK *PENICILLINS*. Rash, Swelling. Codeine Sulfate *ANALGESICS - OPIOID*. Itching.   Past Medical History Macular Degeneration Impaired Hearing Osteoporosis Measles Mumps Rubella Menopause Degenerative Disc Disease Basal Cell Carcinoma. Skin Cancer Hypercholesterolemia Palpitations Asthma   Family History Father. Deceased. age 75, Aortic Anerysm Rupture Mother. Deceased. age 95   Social History Marital status. Divorced. Tobacco use. Never smoker. Alcohol use. Never consumed alcohol. Post-Surgical Plans. Plan is for [place] Advance Directives. Living Will, Healthcare POA   Medication History Lipitor ( Oral) Specific dose unknown - Active. Aleve (220MG Tablet, Oral) Active. ICaps ( Oral) Active. Allegra Allergy ( Oral) Specific dose unknown - Active. Calcium Carbonate ( Oral) Specific dose unknown - Active. Vitamin C ( Oral) Specific dose unknown - Active. Vitamin D3 ( Oral) Specific dose unknown - Active. Unisom (25MG Tablet, Oral) Active.   Past Surgical History Other Surgery. Date: 1987. TNJ surgery Spinal Surgery. Date: 1993. Hysterectomy. Date: 1971. partial (non-cancerous) Appendectomy. Date: 1965. Arthroscopy of Knee. Date: 1996. left BasaL Cell Skin Cancer Excisions. 2006, 2014   Review of Systems General:Not Present- Chills, Fever, Night Sweats, Fatigue, Weight Gain, Weight Loss and Memory Loss. Skin:Not Present- Hives, Itching, Rash, Eczema and Lesions. HEENT:Not Present- Tinnitus, Headache, Double Vision, Visual Loss, Hearing Loss and Dentures. Respiratory:Not Present- Shortness of breath with exertion, Shortness of breath at rest, Allergies, Coughing up blood and Chronic Cough. Cardiovascular:Not Present- Chest Pain, Racing/skipping heartbeats, Difficulty Breathing Lying Down, Murmur, Swelling and  Palpitations. Gastrointestinal:Not Present-   Bloody Stool, Heartburn, Abdominal Pain, Vomiting, Nausea, Constipation, Diarrhea, Difficulty Swallowing, Jaundice and Loss of appetitie. Female Genitourinary:Not Present- Blood in Urine, Urinary frequency, Weak urinary stream, Discharge, Flank Pain, Incontinence, Painful Urination, Urgency, Urinary Retention and Urinating at Night. Musculoskeletal:Present- Joint Pain and Back Pain. Not Present- Muscle Weakness, Muscle Pain, Joint Swelling, Morning Stiffness and Spasms. Neurological:Not Present- Tremor, Dizziness, Blackout spells, Paralysis, Difficulty with balance and Weakness. Psychiatric:Not Present- Insomnia.    Vitals Pulse: 84 (Regular) Resp.: 16 (Unlabored) BP: 118/64 (Sitting, Right Arm, Standard)     Physical Exam The physical exam findings are as follows:   General Mental Status - Alert, cooperative and good historian. General Appearance- pleasant. Not in acute distress. Orientation- Oriented X3. Build & Nutrition- Lean (thin framed), Well nourished and Well developed.   Head and Neck Head- normocephalic, atraumatic . Neck Global Assessment- supple. no bruit auscultated on the right and no bruit auscultated on the left.   Eye Vision- Wears corrective lenses. Pupil- Bilateral- Regular and Round. Motion- Bilateral- EOMI.   Chest and Lung Exam Auscultation: Breath sounds:- clear at anterior chest wall and - clear at posterior chest wall. Adventitious sounds:- No Adventitious sounds.   Cardiovascular Auscultation:Rhythm- Regular rate and rhythm. Heart Sounds- S1 WNL and S2 WNL. Murmurs & Other Heart Sounds:Auscultation of the heart reveals - No Murmurs.   Abdomen Palpation/Percussion:Tenderness- Abdomen is non-tender to palpation. Rigidity (guarding)- Abdomen is soft. Auscultation:Auscultation of the abdomen reveals - Bowel sounds normal.   Female Genitourinary  Not  done, not pertinent to present illness  Musculoskeletal She is a well developed female. She is alert and oriented. No apparent distress. Evaluation of her left hip shows a normal range of motion. No discomfort. The right hip flexion is to 95. No internal rotation. About 10-15 degrees of external rotation and 10-15 degrees of abduction. The knee exams are normal. Pulse, sensation and motor are intact in both lower extremities.  RADIOGRAPHS: AP of the pelvis and lateral of her right hip show a severe end stage arthritis of her right hip. Bone on bone throughout. There is a large osteophyte formation.   Assessment & Plan Hip osteoarthritis (715.95) Impression: Right Hip  Note: Plan is for a Right Total Hip Replacement - Anterior Approach by Dr. Wynelle Link.  Plan is to go to [facility]  PCP - Dr. Toniann Ket Cardiology - Dr. Warren Danes - Patient has been seen preoperatively and felt to be stable for surgery.  EKG performed in Dr. Sherryl Barters office on 04/13/2013 showed sinus rhythm and was within normal limits.  The patient will not receive TXA (tranexamic acid) due to: Skin Cancer  Signed electronically by Joelene Millin, III PA-C

## 2013-06-29 DIAGNOSIS — M169 Osteoarthritis of hip, unspecified: Secondary | ICD-10-CM | POA: Diagnosis present

## 2013-06-29 NOTE — Anesthesia Preprocedure Evaluation (Addendum)
Anesthesia Evaluation  Patient identified by MRN, date of birth, ID band Patient awake    Reviewed: Allergy & Precautions, H&P , NPO status , Patient's Chart, lab work & pertinent test results  Airway       Dental   Pulmonary asthma ,          Cardiovascular negative cardio ROS      Neuro/Psych negative neurological ROS  negative psych ROS   GI/Hepatic negative GI ROS, Neg liver ROS,   Endo/Other  negative endocrine ROS  Renal/GU negative Renal ROS     Musculoskeletal negative musculoskeletal ROS (+)   Abdominal   Peds  Hematology negative hematology ROS (+)   Anesthesia Other Findings   Reproductive/Obstetrics negative OB ROS                           Anesthesia Physical Anesthesia Plan  ASA: II  Anesthesia Plan: Spinal   Post-op Pain Management:    Induction:   Airway Management Planned:   Additional Equipment:   Intra-op Plan:   Post-operative Plan:   Informed Consent: I have reviewed the patients History and Physical, chart, labs and discussed the procedure including the risks, benefits and alternatives for the proposed anesthesia with the patient or authorized representative who has indicated his/her understanding and acceptance.   Dental advisory given  Plan Discussed with: CRNA  Anesthesia Plan Comments:         Anesthesia Quick Evaluation

## 2013-06-30 ENCOUNTER — Encounter (HOSPITAL_COMMUNITY)
Admission: RE | Disposition: A | Discharge status: Skilled Nursing Facility | Payer: Self-pay | Source: Ambulatory Visit | Attending: Orthopedic Surgery

## 2013-06-30 ENCOUNTER — Inpatient Hospital Stay (HOSPITAL_COMMUNITY): Payer: Medicare Other | Admitting: Anesthesiology

## 2013-06-30 ENCOUNTER — Inpatient Hospital Stay (HOSPITAL_COMMUNITY)
Admission: RE | Admit: 2013-06-30 | Discharge: 2013-07-02 | DRG: 470 | Disposition: A | Discharge status: Skilled Nursing Facility | Payer: Medicare Other | Source: Ambulatory Visit | Attending: Orthopedic Surgery | Admitting: Orthopedic Surgery

## 2013-06-30 ENCOUNTER — Inpatient Hospital Stay (HOSPITAL_COMMUNITY): Payer: Medicare Other

## 2013-06-30 ENCOUNTER — Encounter (HOSPITAL_COMMUNITY): Payer: Medicare Other | Admitting: Anesthesiology

## 2013-06-30 ENCOUNTER — Encounter (HOSPITAL_COMMUNITY): Payer: Self-pay | Admitting: *Deleted

## 2013-06-30 DIAGNOSIS — M810 Age-related osteoporosis without current pathological fracture: Secondary | ICD-10-CM | POA: Diagnosis present

## 2013-06-30 DIAGNOSIS — M169 Osteoarthritis of hip, unspecified: Secondary | ICD-10-CM | POA: Diagnosis present

## 2013-06-30 DIAGNOSIS — D62 Acute posthemorrhagic anemia: Secondary | ICD-10-CM

## 2013-06-30 DIAGNOSIS — H3530 Unspecified macular degeneration: Secondary | ICD-10-CM | POA: Diagnosis present

## 2013-06-30 DIAGNOSIS — M159 Polyosteoarthritis, unspecified: Secondary | ICD-10-CM | POA: Diagnosis present

## 2013-06-30 DIAGNOSIS — H9190 Unspecified hearing loss, unspecified ear: Secondary | ICD-10-CM | POA: Diagnosis present

## 2013-06-30 DIAGNOSIS — Z85828 Personal history of other malignant neoplasm of skin: Secondary | ICD-10-CM

## 2013-06-30 DIAGNOSIS — E7800 Pure hypercholesterolemia, unspecified: Secondary | ICD-10-CM | POA: Diagnosis present

## 2013-06-30 DIAGNOSIS — IMO0002 Reserved for concepts with insufficient information to code with codable children: Secondary | ICD-10-CM

## 2013-06-30 DIAGNOSIS — J45909 Unspecified asthma, uncomplicated: Secondary | ICD-10-CM | POA: Diagnosis present

## 2013-06-30 DIAGNOSIS — Z96649 Presence of unspecified artificial hip joint: Secondary | ICD-10-CM

## 2013-06-30 DIAGNOSIS — M1610 Unilateral primary osteoarthritis, unspecified hip: Principal | ICD-10-CM | POA: Diagnosis present

## 2013-06-30 HISTORY — PX: TOTAL HIP ARTHROPLASTY: SHX124

## 2013-06-30 LAB — TYPE AND SCREEN
ABO/RH(D): O POS
Antibody Screen: NEGATIVE

## 2013-06-30 SURGERY — ARTHROPLASTY, HIP, TOTAL, ANTERIOR APPROACH
Anesthesia: Spinal | Site: Hip | Laterality: Right

## 2013-06-30 MED ORDER — MEPERIDINE HCL 50 MG/ML IJ SOLN
6.2500 mg | INTRAMUSCULAR | Status: DC | PRN
  Administered 2013-06-30: 6.25 mg via INTRAVENOUS

## 2013-06-30 MED ORDER — BUPIVACAINE HCL (PF) 0.25 % IJ SOLN
INTRAMUSCULAR | Status: AC
  Filled 2013-06-30: qty 30

## 2013-06-30 MED ORDER — ONDANSETRON HCL 4 MG/2ML IJ SOLN
INTRAMUSCULAR | Status: AC
  Filled 2013-06-30: qty 2

## 2013-06-30 MED ORDER — DEXAMETHASONE 6 MG PO TABS
10.0000 mg | ORAL_TABLET | Freq: Every day | ORAL | Status: AC
  Administered 2013-07-01: 10 mg via ORAL
  Filled 2013-06-30: qty 1

## 2013-06-30 MED ORDER — SODIUM CHLORIDE 0.9 % IJ SOLN
INTRAMUSCULAR | Status: DC | PRN
  Administered 2013-06-30: 10:00:00

## 2013-06-30 MED ORDER — KETAMINE HCL 10 MG/ML IJ SOLN
INTRAMUSCULAR | Status: AC
  Filled 2013-06-30: qty 1

## 2013-06-30 MED ORDER — PHENYLEPHRINE HCL 10 MG/ML IJ SOLN
INTRAMUSCULAR | Status: DC | PRN
  Administered 2013-06-30 (×6): 40 ug via INTRAVENOUS

## 2013-06-30 MED ORDER — ONDANSETRON HCL 4 MG PO TABS: 4.0000 mg | ORAL_TABLET | Freq: Four times a day (QID) | ORAL | Status: DC | PRN

## 2013-06-30 MED ORDER — BUPIVACAINE IN DEXTROSE 0.75-8.25 % IT SOLN
INTRATHECAL | Status: DC | PRN
  Administered 2013-06-30: 1.5 mL via INTRATHECAL

## 2013-06-30 MED ORDER — BISACODYL 10 MG RE SUPP: 10.0000 mg | Freq: Every day | RECTAL | Status: DC | PRN

## 2013-06-30 MED ORDER — TRAMADOL HCL 50 MG PO TABS: 50.0000 mg | ORAL_TABLET | Freq: Four times a day (QID) | ORAL | Status: DC | PRN

## 2013-06-30 MED ORDER — OXYCODONE HCL 5 MG PO TABS: 5.0000 mg | ORAL_TABLET | Freq: Once | ORAL | Status: DC | PRN

## 2013-06-30 MED ORDER — DEXAMETHASONE SODIUM PHOSPHATE 10 MG/ML IJ SOLN
INTRAMUSCULAR | Status: AC
  Filled 2013-06-30: qty 1

## 2013-06-30 MED ORDER — RIVAROXABAN 10 MG PO TABS
10.0000 mg | ORAL_TABLET | Freq: Every day | ORAL | Status: DC
  Administered 2013-07-01 – 2013-07-02 (×2): 10 mg via ORAL
  Filled 2013-06-30 (×3): qty 1

## 2013-06-30 MED ORDER — POLYETHYLENE GLYCOL 3350 17 G PO PACK
17.0000 g | PACK | Freq: Every day | ORAL | Status: DC | PRN
Start: 2013-06-30 — End: 2013-07-02

## 2013-06-30 MED ORDER — MORPHINE SULFATE 2 MG/ML IJ SOLN: 1.0000 mg | INTRAMUSCULAR | Status: DC | PRN

## 2013-06-30 MED ORDER — EPHEDRINE SULFATE 50 MG/ML IJ SOLN
INTRAMUSCULAR | Status: AC
  Filled 2013-06-30: qty 1

## 2013-06-30 MED ORDER — DEXAMETHASONE SODIUM PHOSPHATE 10 MG/ML IJ SOLN
10.0000 mg | Freq: Once | INTRAMUSCULAR | Status: AC
  Administered 2013-06-30: 10 mg via INTRAVENOUS

## 2013-06-30 MED ORDER — LIDOCAINE HCL (CARDIAC) 20 MG/ML IV SOLN
INTRAVENOUS | Status: AC
  Filled 2013-06-30: qty 5

## 2013-06-30 MED ORDER — 0.9 % SODIUM CHLORIDE (POUR BTL) OPTIME
TOPICAL | Status: DC | PRN
  Administered 2013-06-30: 1000 mL

## 2013-06-30 MED ORDER — DEXTROSE-NACL 5-0.9 % IV SOLN
INTRAVENOUS | Status: DC
  Administered 2013-06-30 – 2013-07-01 (×3): via INTRAVENOUS

## 2013-06-30 MED ORDER — LACTATED RINGERS IV SOLN
INTRAVENOUS | Status: DC | PRN
  Administered 2013-06-30 (×2): via INTRAVENOUS

## 2013-06-30 MED ORDER — PHENOL 1.4 % MT LIQD: 1.0000 | OROMUCOSAL | Status: DC | PRN

## 2013-06-30 MED ORDER — KETOROLAC TROMETHAMINE 15 MG/ML IJ SOLN
7.5000 mg | Freq: Four times a day (QID) | INTRAMUSCULAR | Status: AC | PRN
  Administered 2013-06-30 – 2013-07-01 (×2): 7.5 mg via INTRAVENOUS
  Filled 2013-06-30 (×3): qty 1

## 2013-06-30 MED ORDER — PROPOFOL INFUSION 10 MG/ML OPTIME
INTRAVENOUS | Status: DC | PRN
  Administered 2013-06-30: 75 ug/kg/min via INTRAVENOUS

## 2013-06-30 MED ORDER — FLEET ENEMA 7-19 GM/118ML RE ENEM: 1.0000 | ENEMA | Freq: Once | RECTAL | Status: AC | PRN

## 2013-06-30 MED ORDER — PROPOFOL 10 MG/ML IV BOLUS
INTRAVENOUS | Status: AC
  Filled 2013-06-30: qty 20

## 2013-06-30 MED ORDER — ONDANSETRON HCL 4 MG/2ML IJ SOLN: 4.0000 mg | Freq: Four times a day (QID) | INTRAMUSCULAR | Status: DC | PRN

## 2013-06-30 MED ORDER — PHENYLEPHRINE HCL 10 MG/ML IJ SOLN
10.0000 mg | INTRAMUSCULAR | Status: DC | PRN
  Administered 2013-06-30: 10 ug/min via INTRAVENOUS

## 2013-06-30 MED ORDER — LORATADINE 10 MG PO TABS
10.0000 mg | ORAL_TABLET | Freq: Every day | ORAL | Status: DC
  Administered 2013-06-30 – 2013-07-02 (×3): 10 mg via ORAL
  Filled 2013-06-30 (×3): qty 1

## 2013-06-30 MED ORDER — METOCLOPRAMIDE HCL 10 MG PO TABS: 5.0000 mg | ORAL_TABLET | Freq: Three times a day (TID) | ORAL | Status: DC | PRN

## 2013-06-30 MED ORDER — ACETAMINOPHEN 325 MG PO TABS
650.0000 mg | ORAL_TABLET | Freq: Four times a day (QID) | ORAL | Status: DC | PRN
  Administered 2013-07-01 – 2013-07-02 (×2): 650 mg via ORAL
  Filled 2013-06-30 (×2): qty 2

## 2013-06-30 MED ORDER — DOCUSATE SODIUM 100 MG PO CAPS
100.0000 mg | ORAL_CAPSULE | Freq: Two times a day (BID) | ORAL | Status: DC
  Administered 2013-06-30 – 2013-07-02 (×4): 100 mg via ORAL

## 2013-06-30 MED ORDER — SODIUM CHLORIDE 0.9 % IJ SOLN
INTRAMUSCULAR | Status: AC
  Filled 2013-06-30: qty 30

## 2013-06-30 MED ORDER — VANCOMYCIN HCL IN DEXTROSE 1-5 GM/200ML-% IV SOLN
1000.0000 mg | Freq: Two times a day (BID) | INTRAVENOUS | Status: AC
  Administered 2013-06-30: 1000 mg via INTRAVENOUS
  Filled 2013-06-30: qty 200

## 2013-06-30 MED ORDER — MENTHOL 3 MG MT LOZG: 1.0000 | LOZENGE | OROMUCOSAL | Status: DC | PRN

## 2013-06-30 MED ORDER — HYDROMORPHONE HCL PF 1 MG/ML IJ SOLN: 0.2500 mg | INTRAMUSCULAR | Status: DC | PRN

## 2013-06-30 MED ORDER — MEPERIDINE HCL 50 MG/ML IJ SOLN
INTRAMUSCULAR | Status: AC
  Filled 2013-06-30: qty 1

## 2013-06-30 MED ORDER — KETAMINE HCL 10 MG/ML IJ SOLN
INTRAMUSCULAR | Status: DC | PRN
  Administered 2013-06-30 (×4): 5 mg via INTRAVENOUS

## 2013-06-30 MED ORDER — ACETAMINOPHEN 500 MG PO TABS
1000.0000 mg | ORAL_TABLET | Freq: Four times a day (QID) | ORAL | Status: AC
  Administered 2013-06-30 – 2013-07-01 (×4): 1000 mg via ORAL
  Filled 2013-06-30 (×4): qty 2

## 2013-06-30 MED ORDER — OXYCODONE HCL 5 MG/5ML PO SOLN
5.0000 mg | Freq: Once | ORAL | Status: DC | PRN
  Filled 2013-06-30: qty 5

## 2013-06-30 MED ORDER — METOCLOPRAMIDE HCL 5 MG/ML IJ SOLN: 5.0000 mg | Freq: Three times a day (TID) | INTRAMUSCULAR | Status: DC | PRN

## 2013-06-30 MED ORDER — VANCOMYCIN HCL IN DEXTROSE 1-5 GM/200ML-% IV SOLN
INTRAVENOUS | Status: AC
  Filled 2013-06-30: qty 200

## 2013-06-30 MED ORDER — ACETAMINOPHEN 10 MG/ML IV SOLN
1000.0000 mg | Freq: Once | INTRAVENOUS | Status: DC
  Administered 2013-06-30: 1000 mg via INTRAVENOUS
  Filled 2013-06-30: qty 100

## 2013-06-30 MED ORDER — PROMETHAZINE HCL 25 MG/ML IJ SOLN: 6.2500 mg | INTRAMUSCULAR | Status: DC | PRN

## 2013-06-30 MED ORDER — TRANEXAMIC ACID 100 MG/ML IV SOLN
1000.0000 mg | INTRAVENOUS | Status: AC
  Administered 2013-06-30: 1000 mg via INTRAVENOUS
  Filled 2013-06-30: qty 10

## 2013-06-30 MED ORDER — METHOCARBAMOL 500 MG PO TABS
500.0000 mg | ORAL_TABLET | Freq: Four times a day (QID) | ORAL | Status: DC | PRN
  Administered 2013-07-02: 500 mg via ORAL
  Filled 2013-06-30: qty 1

## 2013-06-30 MED ORDER — ACETAMINOPHEN 650 MG RE SUPP: 650.0000 mg | Freq: Four times a day (QID) | RECTAL | Status: DC | PRN

## 2013-06-30 MED ORDER — ONDANSETRON HCL 4 MG/2ML IJ SOLN
INTRAMUSCULAR | Status: DC | PRN
  Administered 2013-06-30: 4 mg via INTRAVENOUS

## 2013-06-30 MED ORDER — METHOCARBAMOL 100 MG/ML IJ SOLN
500.0000 mg | Freq: Four times a day (QID) | INTRAMUSCULAR | Status: DC | PRN
  Administered 2013-06-30: 500 mg via INTRAVENOUS
  Filled 2013-06-30: qty 5

## 2013-06-30 MED ORDER — BUPIVACAINE LIPOSOME 1.3 % IJ SUSP
20.0000 mL | Freq: Once | INTRAMUSCULAR | Status: DC
  Filled 2013-06-30: qty 20

## 2013-06-30 MED ORDER — CHLORHEXIDINE GLUCONATE 4 % EX LIQD: 60.0000 mL | Freq: Once | CUTANEOUS | Status: DC

## 2013-06-30 MED ORDER — MIDAZOLAM HCL 2 MG/2ML IJ SOLN
INTRAMUSCULAR | Status: AC
  Filled 2013-06-30: qty 2

## 2013-06-30 MED ORDER — ATORVASTATIN CALCIUM 10 MG PO TABS
10.0000 mg | ORAL_TABLET | Freq: Every morning | ORAL | Status: DC
  Administered 2013-07-01 – 2013-07-02 (×2): 10 mg via ORAL
  Filled 2013-06-30 (×2): qty 1

## 2013-06-30 MED ORDER — DEXAMETHASONE SODIUM PHOSPHATE 10 MG/ML IJ SOLN
10.0000 mg | Freq: Every day | INTRAMUSCULAR | Status: AC
  Filled 2013-06-30: qty 1

## 2013-06-30 MED ORDER — SODIUM CHLORIDE 0.9 % IJ SOLN
INTRAMUSCULAR | Status: AC
  Filled 2013-06-30: qty 10

## 2013-06-30 MED ORDER — MIDAZOLAM HCL 5 MG/5ML IJ SOLN
INTRAMUSCULAR | Status: DC | PRN
Start: 2013-06-30 — End: 2013-06-30
  Administered 2013-06-30 (×2): 1 mg via INTRAVENOUS

## 2013-06-30 MED ORDER — OXYCODONE HCL 5 MG PO TABS
5.0000 mg | ORAL_TABLET | ORAL | Status: DC | PRN
  Filled 2013-06-30: qty 1

## 2013-06-30 MED ORDER — DIPHENHYDRAMINE HCL 12.5 MG/5ML PO ELIX
12.5000 mg | ORAL_SOLUTION | ORAL | Status: DC | PRN
  Administered 2013-06-30 – 2013-07-01 (×2): 12.5 mg via ORAL
  Filled 2013-06-30: qty 5
  Filled 2013-06-30: qty 10

## 2013-06-30 MED ORDER — SODIUM CHLORIDE 0.9 % IV SOLN: INTRAVENOUS | Status: DC

## 2013-06-30 MED ORDER — PHENYLEPHRINE HCL 10 MG/ML IJ SOLN
INTRAMUSCULAR | Status: AC
  Filled 2013-06-30: qty 1

## 2013-06-30 MED ORDER — VANCOMYCIN HCL IN DEXTROSE 1-5 GM/200ML-% IV SOLN
1000.0000 mg | INTRAVENOUS | Status: AC
  Administered 2013-06-30: 1000 mg via INTRAVENOUS

## 2013-06-30 MED ORDER — BUPIVACAINE HCL (PF) 0.25 % IJ SOLN
INTRAMUSCULAR | Status: DC | PRN
  Administered 2013-06-30: 20 mL

## 2013-06-30 SURGICAL SUPPLY — 42 items
BAG SPEC THK2 15X12 ZIP CLS (MISCELLANEOUS)
BAG ZIPLOCK 12X15 (MISCELLANEOUS) IMPLANT
BLADE SAW SGTL 18X1.27X75 (BLADE) ×2 IMPLANT
BLADE SAW SGTL 18X1.27X75MM (BLADE) ×1
CAPT HIP PF MOP ×2 IMPLANT
CLOSURE WOUND 1/2 X4 (GAUZE/BANDAGES/DRESSINGS) ×2
DECANTER SPIKE VIAL GLASS SM (MISCELLANEOUS) ×3 IMPLANT
DRAPE C-ARM 42X120 X-RAY (DRAPES) ×3 IMPLANT
DRAPE STERI IOBAN 125X83 (DRAPES) ×3 IMPLANT
DRAPE U-SHAPE 47X51 STRL (DRAPES) ×9 IMPLANT
DRSG ADAPTIC 3X8 NADH LF (GAUZE/BANDAGES/DRESSINGS) ×3 IMPLANT
DRSG MEPILEX BORDER 4X4 (GAUZE/BANDAGES/DRESSINGS) ×3 IMPLANT
DRSG MEPILEX BORDER 4X8 (GAUZE/BANDAGES/DRESSINGS) ×3 IMPLANT
DURAPREP 26ML APPLICATOR (WOUND CARE) ×3 IMPLANT
ELECT BLADE 6.5 EXT (BLADE) ×3 IMPLANT
ELECT REM PT RETURN 9FT ADLT (ELECTROSURGICAL) ×3
ELECTRODE REM PT RTRN 9FT ADLT (ELECTROSURGICAL) ×1 IMPLANT
EVACUATOR 1/8 PVC DRAIN (DRAIN) ×3 IMPLANT
FACESHIELD LNG OPTICON STERILE (SAFETY) ×12 IMPLANT
GLOVE BIO SURGEON STRL SZ7.5 (GLOVE) ×3 IMPLANT
GLOVE BIO SURGEON STRL SZ8 (GLOVE) ×6 IMPLANT
GLOVE BIOGEL PI IND STRL 8 (GLOVE) ×2 IMPLANT
GLOVE BIOGEL PI INDICATOR 8 (GLOVE) ×4
GOWN STRL REUS W/TWL LRG LVL3 (GOWN DISPOSABLE) ×3 IMPLANT
GOWN STRL REUS W/TWL XL LVL3 (GOWN DISPOSABLE) ×3 IMPLANT
KIT BASIN OR (CUSTOM PROCEDURE TRAY) ×3 IMPLANT
NDL SAFETY ECLIPSE 18X1.5 (NEEDLE) ×2 IMPLANT
NEEDLE HYPO 18GX1.5 SHARP (NEEDLE) ×6
PACK TOTAL JOINT (CUSTOM PROCEDURE TRAY) ×3 IMPLANT
PADDING CAST COTTON 6X4 STRL (CAST SUPPLIES) ×3 IMPLANT
SPONGE GAUZE 4X4 12PLY (GAUZE/BANDAGES/DRESSINGS) ×2 IMPLANT
STRIP CLOSURE SKIN 1/2X4 (GAUZE/BANDAGES/DRESSINGS) ×3 IMPLANT
SUCTION FRAZIER 12FR DISP (SUCTIONS) IMPLANT
SUT ETHIBOND NAB CT1 #1 30IN (SUTURE) ×3 IMPLANT
SUT MNCRL AB 4-0 PS2 18 (SUTURE) ×3 IMPLANT
SUT VIC AB 2-0 CT1 27 (SUTURE) ×6
SUT VIC AB 2-0 CT1 TAPERPNT 27 (SUTURE) ×2 IMPLANT
SUT VLOC 180 0 24IN GS25 (SUTURE) ×3 IMPLANT
SYR 20CC LL (SYRINGE) ×3 IMPLANT
SYR 50ML LL SCALE MARK (SYRINGE) ×3 IMPLANT
TOWEL OR 17X26 10 PK STRL BLUE (TOWEL DISPOSABLE) ×3 IMPLANT
TRAY FOLEY CATH 14FRSI W/METER (CATHETERS) ×3 IMPLANT

## 2013-06-30 NOTE — Evaluation (Signed)
Physical Therapy Evaluation Patient Details Name: Darlene Serrano MRN: [id number] DOB: 01-07-1935 Today's Date: 06/30/2013 Time: 2637-8588 PT Time Calculation (min): 32 min  PT Assessment / Plan / Recommendation History of Present Illness     Clinical Impression  Pt s/p R THR presents with decreased R LE strength/ROM and post op pain and dizziness with OOB activity limiting functional mobility.  Pt would benefit from follow up rehab at SNF level to maximize safety and IND prior to return home with limited assist.    PT Assessment  Patient needs continued PT services    Follow Up Recommendations  SNF    Does the patient have the potential to tolerate intense rehabilitation      Barriers to Discharge        Equipment Recommendations  None recommended by PT    Recommendations for Other Services OT consult   Frequency 7X/week    Precautions / Restrictions Precautions Precautions: Fall Restrictions Weight Bearing Restrictions: No Other Position/Activity Restrictions: WBAT   Pertinent Vitals/Pain 2-3/10; premed, ice pack provided.      Mobility  Bed Mobility Overal bed mobility: Needs Assistance Bed Mobility: Supine to Sit Supine to sit: Min assist General bed mobility comments: cues for sequence and use of L LE to self assist Transfers Overall transfer level: Needs assistance Equipment used: Rolling walker (2 wheeled) Transfers: Sit to/from Stand Sit to Stand: Min assist;Mod assist General transfer comment: cues for LE management and use of UEs to self assist Ambulation/Gait Ambulation/Gait assistance: Min assist;Mod assist Ambulation Distance (Feet): 21 Feet Assistive device: Rolling walker (2 wheeled) Gait Pattern/deviations: Step-to pattern;Decreased step length - right;Decreased step length - left;Shuffle;Trunk flexed General Gait Details: cues for sequence, posture, position from RW - ltd by onset of dizziness    Exercises Total Joint Exercises Ankle  Circles/Pumps: AROM;15 reps;Both;Supine Quad Sets: AROM;Both;10 reps;Supine Heel Slides: AAROM;15 reps;Supine;Right Hip ABduction/ADduction: AAROM;Right;15 reps;Supine   PT Diagnosis: Difficulty walking  PT Problem List: Decreased strength;Decreased range of motion;Decreased activity tolerance;Decreased mobility;Decreased knowledge of use of DME;Pain PT Treatment Interventions: DME instruction;Gait training;Stair training;Functional mobility training;Therapeutic activities;Therapeutic exercise;Patient/family education     PT Goals(Current goals can be found in the care plan section) Acute Rehab PT Goals Patient Stated Goal: Rehab and home to resume previous lifestyle with decreased pain PT Goal Formulation: With patient Time For Goal Achievement: 07/07/13 Potential to Achieve Goals: Good  Visit Information  Last PT Received On: 06/30/13 Assistance Needed: +2 (Pt dizzy with ambulation)       Prior [place] expects to be discharged to:: Skilled nursing facility Living Arrangements: Children Prior Function Level of Independence: Independent Communication Communication: HOH    Cognition  Cognition Arousal/Alertness: Awake/alert Behavior During Therapy: WFL for tasks assessed/performed Overall Cognitive Status: Within Functional Limits for tasks assessed    Extremity/Trunk Assessment Upper Extremity Assessment Upper Extremity Assessment: Overall WFL for tasks assessed Lower Extremity Assessment Lower Extremity Assessment: RLE deficits/detail RLE Deficits / Details: Hip strength 2+/5 with AAROM at hip to 90 flex and 20 abd Cervical / Trunk Assessment Cervical / Trunk Assessment: Normal   Balance    End of Session PT - End of Session Equipment Utilized During Treatment: Gait belt Activity Tolerance: Patient tolerated treatment well;Other (comment) (dizzy with amb) Patient left: in chair;with call bell/phone within reach;with family/visitor  present Nurse Communication: Mobility status  GP     Darlene Serrano 06/30/2013, 4:42 PM

## 2013-06-30 NOTE — Op Note (Signed)
OPERATIVE REPORT  PREOPERATIVE DIAGNOSIS: Osteoarthritis of the Right hip.   POSTOPERATIVE DIAGNOSIS: Osteoarthritis of the Right  hip.   PROCEDURE: Right total hip arthroplasty, anterior approach.   SURGEON: Gaynelle Arabian, MD   ASSISTANT: Arlee Muslim, PA-C  ANESTHESIA:  Spinal  ESTIMATED BLOOD LOSS: 300 ml   DRAINS: Hemovac x1.   COMPLICATIONS: None   CONDITION: PACU - hemodynamically stable.   BRIEF CLINICAL NOTE: Darlene Serrano is a 79 y.o. female who has advanced end-  stage arthritis of his Right  hip with progressively worsening pain and  dysfunction.The patient has failed nonoperative management and presents for  total hip arthroplasty.   PROCEDURE IN DETAIL: After successful administration of spinal  anesthetic, the traction boots for the [facility] bed were placed on both  feet and the patient was placed onto the [facility] bed, boots placed into the leg  holders. The Right hip was then isolated from the perineum with plastic  drapes and prepped and draped in the usual sterile fashion. ASIS and  greater trochanter were marked and a oblique incision was made, starting  at about 1 cm lateral and 2 cm distal to the ASIS and coursing towards  the anterior cortex of the femur. The skin was cut with a 10 blade  through subcutaneous tissue to the level of the fascia overlying the  tensor fascia lata muscle. The fascia was then incised in line with the  incision at the junction of the anterior third and posterior 2/3rd. The  muscle was teased off the fascia and then the interval between the TFL  and the rectus was developed. The Hohmann retractor was then placed at  the top of the femoral neck over the capsule. The vessels overlying the  capsule were cauterized and the fat on top of the capsule was removed.  A Hohmann retractor was then placed anterior underneath the rectus  femoris to give exposure to the entire anterior capsule. A T-shaped  capsulotomy was performed.  The edges were tagged and the femoral head  was identified.       Osteophytes are removed off the superior acetabulum.  The femoral neck was then cut in situ with an oscillating saw. Traction  was then applied to the left lower extremity utilizing the [facility]  traction. The femoral head was then removed. Retractors were placed  around the acetabulum and then circumferential removal of the labrum was  performed. Osteophytes were also removed. Reaming starts at 45 mm to  medialize and  Increased in 2 mm increments to 51 mm. We reamed in  approximately 40 degrees of abduction, 20 degrees anteversion. A 52 mm  pinnacle acetabular shell was then impacted in anatomic position under  fluoroscopic guidance with excellent purchase. We did not need to place  any additional dome screws. A 32 mm neutral + 4 marathon liner was then  placed into the acetabular shell.       The femoral lift was then placed along the lateral aspect of the femur  just distal to the vastus ridge. The leg was  externally rotated and capsule  was stripped off the inferior aspect of the femoral neck down to the  level of the lesser trochanter, this was done with electrocautery. The femur was lifted after this was performed. The  leg was then placed and extended in adducted position to essentially delivering the femur. We also removed the capsule superiorly and the  piriformis from the  piriformis fossa to gain excellent exposure of the  proximal femur. Rongeur was used to remove some cancellous bone to get  into the lateral portion of the proximal femur for placement of the  initial starter reamer. The starter broaches was placed  the starter broach  and was shown to go down the center of the canal. Broaching  with the  Corail system was then performed starting at size 8, coursing  Up to size 12. A size 12 had excellent torsional and rotational  and axial stability. The trial standard offset neck was then placed  with a 32 + 1  trial head. The hip was then reduced. We confirmed that  the stem was in the canal both on AP and lateral x-rays. It also has excellent sizing. The hip was reduced with outstanding stability through full extension, full external rotation,  and then flexion in adduction internal rotation. AP pelvis was taken  and the leg lengths were measured and found to be exactly equal. Hip  was then dislocated again and the femoral head and neck removed. The  femoral broach was removed. Size 12 Corail stem with a standard offset  neck was then impacted into the femur following native anteversion. Has  excellent purchase in the canal. Excellent torsional and rotational and  axial stability. It is confirmed to be in the canal on AP and lateral  fluoroscopic views. The 32 + 1 metal head was placed and the hip  reduced with outstanding stability. Again AP pelvis was taken and it  confirmed that the leg lengths were equal. The wound was then copiously  irrigated with saline solution and the capsule reattached and repaired  with Ethibond suture.  20 mL of Exparel mixed with 50 mL of saline then additional 20 ml of .25% Bupivicaine injected into the capsule and into the edge of the tensor fascia lata as well as subcutaneous tissue. The fascia overlying the tensor fascia lata was  then closed with a running #1 V-Loc. Subcu was closed with interrupted  2-0 Vicryl and subcuticular running 4-0 Monocryl. Incision was cleaned  and dried. Steri-Strips and a bulky sterile dressing applied. Hemovac  drain was hooked to suction and then he was awakened and transported to  recovery in stable condition.        Please note that a surgical assistant was a medical necessity for this procedure to perform it in a safe and expeditious manner. Assistant was necessary to provide appropriate retraction of vital neurovascular structures and to prevent femoral fracture and allow for anatomic placement of the prosthesis.  Gaynelle Arabian,  M.D.

## 2013-06-30 NOTE — Interval H&P Note (Signed)
History and Physical Interval Note:  06/30/2013 7:56 AM  Darlene Serrano  has presented today for surgery, with the diagnosis of OA RIGHT HIP   The various methods of treatment have been discussed with the patient and family. After consideration of risks, benefits and other options for treatment, the patient has consented to  Procedure(s): RIGHT TOTAL HIP ARTHROPLASTY ANTERIOR APPROACH (Right) as a surgical intervention .  The patient's history has been reviewed, patient examined, no change in status, stable for surgery.  I have reviewed the patient's chart and labs.  Questions were answered to the patient's satisfaction.     Gearlean Alf

## 2013-06-30 NOTE — Discharge Instructions (Addendum)
°Dr. Frank Aluisio °Total Joint Specialist °[facility] °[address] °[place], [place] [address] °[phone] ° ° ° °ANTERIOR APPROACH TOTAL HIP REPLACEMENT POSTOPERATIVE DIRECTIONS ° ° °Hip Rehabilitation, Guidelines Following Surgery  °The results of a hip operation are greatly improved after range of motion and muscle strengthening exercises. Follow all safety measures which are given to protect your hip. If any of these exercises cause increased pain or swelling in your joint, decrease the amount until you are comfortable again. Then slowly increase the exercises. Call your caregiver if you have problems or questions.  °HOME CARE INSTRUCTIONS  °Most of the following instructions are designed to prevent the dislocation of your new hip.  °Remove items at home which could result in a fall. This includes throw rugs or furniture in walking pathways.  °Continue medications as instructed at time of discharge. °· You may have some home medications which will be placed on hold until you complete the course of blood thinner medication. °· You may start showering once you are discharged home but do not submerge the incision under water. Just pat the incision dry and apply a dry gauze dressing on daily. °Do not put on socks or shoes without following the instructions of your caregivers.  °Sit on high chairs which makes it easier to stand.  °Sit on chairs with arms. Use the chair arms to help push yourself up when arising.  °Keep your leg on the side of the operation out in front of you when standing up.  °Arrange for the use of a toilet seat elevator so you are not sitting low.   °· Walk with walker as instructed.  °You may resume a sexual relationship in one month or when given the OK by your caregiver.  °Use walker as long as suggested by your caregivers.  °You may put full weight on your legs and walk as much as is comfortable. °Avoid periods of inactivity such as sitting longer than an hour  when not asleep. This helps prevent blood clots.  °You may return to work once you are cleared by your surgeon.  °Do not drive a car for 6 weeks or until released by your surgeon.  °Do not drive while taking narcotics.  °Wear elastic stockings for three weeks following surgery during the day but you may remove then at night.  °Make sure you keep all of your appointments after your operation with all of your doctors and caregivers. You should call the office at the above phone number and make an appointment for approximately two weeks after the date of your surgery. °Change the dressing daily and reapply a dry dressing each time. °Please pick up a stool softener and laxative for home use as long as you are requiring pain medications. °· Continue to use ice on the hip for pain and swelling from surgery. You may notice swelling that will progress down to the foot and ankle.  This is normal after  surgery.  Elevate the leg when you are not up walking on it.   °It is important for you to complete the blood thinner medication as prescribed by your doctor. °· Continue to use the breathing machine which will help keep your temperature down.  It is common for your temperature to cycle up and down following surgery, especially at night when you are not up moving around and exerting yourself.  The breathing machine keeps your lungs expanded and your temperature down. ° °RANGE OF MOTION AND STRENGTHENING EXERCISES  °  These exercises are designed to help you keep full movement of your hip joint. Follow your caregiver's or physical therapist's instructions. Perform all exercises about fifteen times, three times per day or as directed. Exercise both hips, even if you have had only one joint replacement. These exercises can be done on a training (exercise) mat, on the floor, on a table or on a bed. Use whatever works the best and is most comfortable for you. Use music or television while you are exercising so that the exercises are  a pleasant break in your day. This will make your life better with the exercises acting as a break in routine you can look forward to.  Lying on your back, slowly slide your foot toward your buttocks, raising your knee up off the floor. Then slowly slide your foot back down until your leg is straight again.  Lying on your back spread your legs as far apart as you can without causing discomfort.  Lying on your side, raise your upper leg and foot straight up from the floor as far as is comfortable. Slowly lower the leg and repeat.  Lying on your back, tighten up the muscle in the front of your thigh (quadriceps muscles). You can do this by keeping your leg straight and trying to raise your heel off the floor. This helps strengthen the largest muscle supporting your knee.  Lying on your back, tighten up the muscles of your buttocks both with the legs straight and with the knee bent at a comfortable angle while keeping your heel on the floor.   SKILLED REHAB INSTRUCTIONS: If the patient is transferred to a skilled rehab facility following release from the hospital, a list of the current medications will be sent to the facility for the patient to continue.  When discharged from the skilled rehab facility, please have the facility set up the patient's [place] prior to being released. Also, the skilled facility will be responsible for providing the patient with their medications at time of release from the facility to include their pain medication, the muscle relaxants, and their blood thinner medication. If the patient is still at the rehab facility at time of the two week follow up appointment, the skilled rehab facility will also need to assist the patient in arranging follow up appointment in our office and any transportation needs.  MAKE SURE YOU:  Understand these instructions.  Will watch your condition.  Will get help right away if you are not doing well or get worse.  Pick up  stool softner and laxative for home. Do not submerge incision under water. May shower. Continue to use ice for pain and swelling from surgery. Total Hip Protocol. Patient is allowed to sit in a recliner chair.  Take Xarelto for two and a half more weeks, then discontinue Xarelto. Once the patient has completed the blood thinner regimen, then take a Baby 81 mg Aspirin daily for four more weeks.  When discharged from the skilled rehab facility, please have the facility set up the patient's [place] prior to being released.  Also provide the patient with their medications at time of release from the facility to include their pain medication, the muscle relaxants, and their blood thinner medication.  If the patient is still at the rehab facility at time of follow up appointment, please also assist the patient in arranging follow up appointment in our office and any transportation needs.     Information on my  medicine - XARELTO (Rivaroxaban)  This medication education was reviewed with me or my healthcare representative as part of my discharge preparation.  The pharmacist that spoke with me during my hospital stay was:  Rudean Haskell, [facility]  Why was Xarelto prescribed for you? Xarelto was prescribed for you to reduce the risk of blood clots forming after orthopedic surgery. The medical term for these abnormal blood clots is venous thromboembolism (VTE).  What do you need to know about xarelto ? Take your Xarelto ONCE DAILY at the same time every day. You may take it either with or without food.  If you have difficulty swallowing the tablet whole, you may crush it and mix in applesauce just prior to taking your dose.  Take Xarelto exactly as prescribed by your doctor and DO NOT stop taking Xarelto without talking to the doctor who prescribed the medication.  Stopping without other VTE prevention medication to take the place of Xarelto may increase your risk of  developing a clot.  After discharge, you should have regular check-up appointments with your healthcare provider that is prescribing your Xarelto.    What do you do if you miss a dose? If you miss a dose, take it as soon as you remember on the same day then continue your regularly scheduled once daily regimen the next day. Do not take two doses of Xarelto on the same day.   Important Safety Information A possible side effect of Xarelto is bleeding. You should call your healthcare provider right away if you experience any of the following:   Bleeding from an injury or your nose that does not stop.   Unusual colored urine (red or dark brown) or unusual colored stools (red or black).   Unusual bruising for unknown reasons.   A serious fall or if you hit your head (even if there is no bleeding).  Some medicines may interact with Xarelto and might increase your risk of bleeding while on Xarelto. To help avoid this, consult your healthcare provider or pharmacist prior to using any new prescription or non-prescription medications, including herbals, vitamins, non-steroidal anti-inflammatory drugs (NSAIDs) and supplements.  This website has more information on Xarelto: [email or web address]

## 2013-06-30 NOTE — Anesthesia Procedure Notes (Signed)
Spinal  Patient location during procedure: OR Start time: 06/30/2013 8:33 AM End time: 06/30/2013 8:36 AM Staffing Anesthesiologist: Nolon Nations R Performed by: anesthesiologist  Preanesthetic Checklist Completed: patient identified, site marked, surgical consent, pre-op evaluation, timeout performed, IV checked, risks and benefits discussed and monitors and equipment checked Spinal Block Patient position: sitting Prep: ChloraPrep Patient monitoring: heart rate, continuous pulse ox and blood pressure Approach: right paramedian Location: L2-3 Injection technique: single-shot Needle Needle type: Sprotte  Needle gauge: 24 G Needle length: 9 cm Assessment Sensory level: T8 Additional Notes Expiration date of kit checked and confirmed. Patient tolerated procedure well, without complications.

## 2013-06-30 NOTE — H&P (Signed)
Clinical Social Work Department BRIEF PSYCHOSOCIAL ASSESSMENT 06/30/2013  Patient:  Darlene Serrano, Darlene Serrano     Account Number:  [id number]     Admit date:  06/30/2013  Clinical Social Worker:  Lacie Scotts  Date/Time:  06/30/2013 02:13 PM  Referred by:  Physician  Date Referred:  06/30/2013 Referred for  SNF Placement   Other Referral:   Interview type:  Patient Other interview type:    PSYCHOSOCIAL DATA Living Status:  ALONE Admitted from facility:   Level of care:   Primary support name:  Dominica Severin Primary support relationship to patient:  CHILD, ADULT Degree of support available:   supportive    CURRENT CONCERNS Current Concerns  Post-Acute Placement   Other Concerns:    SOCIAL WORK ASSESSMENT / PLAN Pt is a 79 yr old female living at home prior to hospitalization. CSW met with pt / daughter to assist with d/c planning. Pt has made prior arrangements to have [facility] Rehab at [facility] following hospital d/c. CSW has contacted SNF and d/c plan has been confirmed pending [facility] authorization. CSW will begin insurance authorization process once PT eval is available.   Assessment/plan status:  Psychosocial Support/Ongoing Assessment of Needs Other assessment/ plan:   Information/referral to community resources:   Insurance coverage for SNF / Ambulance transport reviewed.    PATIENT'S/FAMILY'S RESPONSE TO PLAN OF CARE: " I toured [facility] and signed admission papers. The facility is lovely. I can't wait to start rehab. " Pt is in good spirits . She is looking forward to rehab.   Werner Lean LCSW [phone]

## 2013-06-30 NOTE — Anesthesia Postprocedure Evaluation (Signed)
Anesthesia Post Note  Patient: Darlene Serrano  Procedure(s) Performed: Procedure(s) (LRB): RIGHT TOTAL HIP ARTHROPLASTY ANTERIOR APPROACH (Right)  Anesthesia type: Spinal  Patient location: PACU  Post pain: Pain level controlled  Post assessment: Post-op Vital signs reviewed  Last Vitals: BP 107/70  Pulse 63  Temp(Src) 36.6 C (Oral)  Resp 12  Ht 5\' 5"  (1.651 m)  Wt 126 lb 15.8 oz (57.6 kg)  BMI 21.13 kg/m2  SpO2 100%  Post vital signs: Reviewed  Level of consciousness: sedated  Complications: No apparent anesthesia complications

## 2013-06-30 NOTE — H&P (Signed)
Clinical Social Work Department CLINICAL SOCIAL WORK PLACEMENT NOTE 06/30/2013  Patient:  Darlene Serrano, Darlene Serrano  Account Number:  [id number] Admit date:  06/30/2013  Clinical Social Worker:  Werner Lean, LCSW  Date/time:  06/30/2013 02:35 PM  Clinical Social Work is seeking post-discharge placement for this patient at the following level of care:   SKILLED NURSING   (*CSW will update this form in Epic as items are completed)     Patient/family provided with [place] Department of Clinical Social Work's list of facilities offering this level of care within the geographic area requested by the patient (or if unable, by the patient's family).  06/30/2013  Patient/family informed of their freedom to choose among providers that offer the needed level of care, that participate in Medicare, Medicaid or managed care program needed by the patient, have an available bed and are willing to accept the patient.    Patient/family informed of [facility]' ownership interest in [facility], as well as of the fact that they are under no obligation to receive care at this facility.  PASARR submitted to EDS on 06/30/2013 PASARR number received from EDS on 06/30/2013  FL2 transmitted to all facilities in geographic area requested by pt/family on  06/30/2013 FL2 transmitted to all facilities within larger geographic area on   Patient informed that his/her managed care company has contracts with or will negotiate with  certain facilities, including the following:     Patient/family informed of bed offers received:  06/30/2013 Patient chooses bed at [facility] Physician recommends and patient chooses bed at    Patient to be transferred to [facility] on   Patient to be transferred to facility by   The following physician request were entered in Epic:   Additional Comments:   Werner Lean LCSW [phone]

## 2013-06-30 NOTE — Preoperative (Signed)
Beta Blockers   Reason not to administer Beta Blockers:Not Applicable 

## 2013-06-30 NOTE — Transfer of Care (Signed)
Immediate Anesthesia Transfer of Care Note  Patient: Darlene Serrano  Procedure(s) Performed: Procedure(s): RIGHT TOTAL HIP ARTHROPLASTY ANTERIOR APPROACH (Right)  Patient Location: PACU  Anesthesia Type:Spinal  Level of Consciousness: awake, alert  and oriented  Airway & Oxygen Therapy: Patient Spontanous Breathing and Patient connected to face mask oxygen  Post-op Assessment: Report given to PACU RN and Post -op Vital signs reviewed and stable  Post vital signs: Reviewed and stable  Complications: No apparent anesthesia complications

## 2013-06-30 NOTE — Plan of Care (Signed)
Problem: Consults Goal: Diagnosis- Total Joint Replacement Hemiarthroplasty

## 2013-06-30 NOTE — H&P (View-Only) (Signed)
Darlene Serrano  DOB: [birth year] Divorced / Language: Cleophus Molt / Race: White Female  Date of Admission:  06-30-2013  Chief Complaint:  Right Hip Pain  History of Present Illness The patient is a 79 year old female who comes in for a preoperative History and Physical. The patient is scheduled for a right total hip arthroplasty (anterior approach) to be performed by Dr. Dione Plover. Aluisio, MD at [facility] on 06-30-2013. The patient is a 79 year old female who presents for follow up of their hip. The patient is being followed for their right hip pain and osteoarthritis. They are 6 year(s) out from the last visit. Symptoms reported today include: pain, while the patient does not report symptoms of: stiffness. Note for "Follow-up Hip": She said she walks 12 to 15 miles a week. She changed her shoes June of last year, and started doing the spinning bike at the local gym. She had a flare after the change in her activities. She did get better, after her last visit, with NSAIDS. She has known arthritis in her hip. She does well when she exercises. She had a span over the earlier part of this year where she had a marked increase in pain. She had a hard time getting around. She went back to her regular shoes and regular activities and has done better since. She still gets some groin pain and lateral hip pain but nothing like she did earlier. She is ready to proceed with the hip surgery. They have been treated conservatively in the past for the above stated problem and despite conservative measures, they continue to have progressive pain and severe functional limitations and dysfunction. They have failed non-operative management including home exercise, medications. It is felt that they would benefit from undergoing total joint replacement. Risks and benefits of the procedure have been discussed with the patient and they elect to proceed with surgery. There are no active contraindications to  surgery such as ongoing infection or rapidly progressive neurological disease.   Allergies Penicillin VK *PENICILLINS*. Rash, Swelling. Codeine Sulfate *ANALGESICS - OPIOID*. Itching.   Past Medical History Macular Degeneration Impaired Hearing Osteoporosis Measles Mumps Rubella Menopause Degenerative Disc Disease Basal Cell Carcinoma. Skin Cancer Hypercholesterolemia Palpitations Asthma   Family History Father. Deceased. age 71, Aortic Anerysm Rupture Mother. Deceased. age 62   Social History Marital status. Divorced. Tobacco use. Never smoker. Alcohol use. Never consumed alcohol. Post-Surgical Plans. Plan is for [employer]. Living Will, Healthcare POA   Medication History Lipitor ( Oral) Specific dose unknown - Active. Aleve (220MG  Tablet, Oral) Active. ICaps ( Oral) Active. Allegra Allergy ( Oral) Specific dose unknown - Active. Calcium Carbonate ( Oral) Specific dose unknown - Active. Vitamin C ( Oral) Specific dose unknown - Active. Vitamin D3 ( Oral) Specific dose unknown - Active. Unisom (25MG  Tablet, Oral) Active.   Past Surgical History Other Surgery. Date: 75. TNJ surgery Spinal Surgery. Date: 25. Hysterectomy. Date: 74. partial (non-cancerous) Appendectomy. Date: 28. Arthroscopy of Knee. Date: 51. left BasaL Cell Skin Cancer Excisions. 2006, 2014   Review of Systems General:Not Present- Chills, Fever, Night Sweats, Fatigue, Weight Gain, Weight Loss and Memory Loss. Skin:Not Present- Hives, Itching, Rash, Eczema and Lesions. HEENT:Not Present- Tinnitus, Headache, Double Vision, Visual Loss, Hearing Loss and Dentures. Respiratory:Not Present- Shortness of breath with exertion, Shortness of breath at rest, Allergies, Coughing up blood and Chronic Cough. Cardiovascular:Not Present- Chest Pain, Racing/skipping heartbeats, Difficulty Breathing Lying Down, Murmur, Swelling and  Palpitations. Gastrointestinal:Not Present-  Bloody Stool, Heartburn, Abdominal Pain, Vomiting, Nausea, Constipation, Diarrhea, Difficulty Swallowing, Jaundice and Loss of appetitie. Female Genitourinary:Not Present- Blood in Urine, Urinary frequency, Weak urinary stream, Discharge, Flank Pain, Incontinence, Painful Urination, Urgency, Urinary Retention and Urinating at Night. Musculoskeletal:Present- Joint Pain and Back Pain. Not Present- Muscle Weakness, Muscle Pain, Joint Swelling, Morning Stiffness and Spasms. Neurological:Not Present- Tremor, Dizziness, Blackout spells, Paralysis, Difficulty with balance and Weakness. Psychiatric:Not Present- Insomnia.    Vitals Pulse: 84 (Regular) Resp.: 16 (Unlabored) BP: 118/64 (Sitting, Right Arm, Standard)     Physical Exam The physical exam findings are as follows:   General Mental Status - Alert, cooperative and good historian. General Appearance- pleasant. Not in acute distress. Orientation- Oriented X3. Build & Nutrition- Lean (thin framed), Well nourished and Well developed.   Head and Neck Head- normocephalic, atraumatic . Neck Global Assessment- supple. no bruit auscultated on the right and no bruit auscultated on the left.   Eye Vision- Wears corrective lenses. Pupil- Bilateral- Regular and Round. Motion- Bilateral- EOMI.   Chest and Lung Exam Auscultation: Breath sounds:- clear at anterior chest wall and - clear at posterior chest wall. Adventitious sounds:- No Adventitious sounds.   Cardiovascular Auscultation:Rhythm- Regular rate and rhythm. Heart Sounds- S1 WNL and S2 WNL. Murmurs & Other Heart Sounds:Auscultation of the heart reveals - No Murmurs.   Abdomen Palpation/Percussion:Tenderness- Abdomen is non-tender to palpation. Rigidity (guarding)- Abdomen is soft. Auscultation:Auscultation of the abdomen reveals - Bowel sounds normal.   Female Genitourinary  Not  done, not pertinent to present illness  Musculoskeletal She is a well developed female. She is alert and oriented. No apparent distress. Evaluation of her left hip shows a normal range of motion. No discomfort. The right hip flexion is to 95. No internal rotation. About 10-15 degrees of external rotation and 10-15 degrees of abduction. The knee exams are normal. Pulse, sensation and motor are intact in both lower extremities.  RADIOGRAPHS: AP of the pelvis and lateral of her right hip show a severe end stage arthritis of her right hip. Bone on bone throughout. There is a large osteophyte formation.   Assessment & Plan Hip osteoarthritis (715.95) Impression: Right Hip  Note: Plan is for a Right Total Hip Replacement - Anterior Approach by Dr. Wynelle Link.  Plan is to go to [facility]  PCP - Dr. Toniann Ket Cardiology - Dr. Warren Danes - Patient has been seen preoperatively and felt to be stable for surgery.  EKG performed in Dr. Sherryl Barters office on 04/13/2013 showed sinus rhythm and was within normal limits.  The patient will not receive TXA (tranexamic acid) due to: Skin Cancer  Signed electronically by Joelene Millin, III PA-C

## 2013-07-01 DIAGNOSIS — D62 Acute posthemorrhagic anemia: Secondary | ICD-10-CM

## 2013-07-01 LAB — BASIC METABOLIC PANEL
BUN: 12 mg/dL (ref 6–23)
CHLORIDE: 107 meq/L (ref 96–112)
CO2: 26 mEq/L (ref 19–32)
CREATININE: 0.68 mg/dL (ref 0.50–1.10)
Calcium: 8.3 mg/dL — ABNORMAL LOW (ref 8.4–10.5)
GFR calc Af Amer: >90 mL/min (ref >90)
GFR, EST NON AFRICAN AMERICAN: 83 mL/min — AB (ref >90)
Glucose, Bld: 125 mg/dL — ABNORMAL HIGH (ref 70–99)
Potassium: 4 mEq/L (ref 3.7–5.3)
Sodium: 142 mEq/L (ref 137–147)

## 2013-07-01 LAB — CBC
HEMATOCRIT: 28.5 % — AB (ref 36.0–46.0)
Hemoglobin: 9.4 g/dL — ABNORMAL LOW (ref 12.0–15.0)
MCH: 31.2 pg (ref 26.0–34.0)
MCHC: 33 g/dL (ref 30.0–36.0)
MCV: 94.7 fL (ref 78.0–100.0)
Platelets: 151 10*3/uL (ref 150–400)
RBC: 3.01 MIL/uL — ABNORMAL LOW (ref 3.87–5.11)
RDW: 13.6 % (ref 11.5–15.5)
WBC: 6.4 10*3/uL (ref 4.0–10.5)

## 2013-07-01 MED ORDER — SODIUM CHLORIDE 0.9 % IV BOLUS (SEPSIS)
500.0000 mL | Freq: Once | INTRAVENOUS | Status: AC
  Administered 2013-07-01: 500 mL via INTRAVENOUS

## 2013-07-01 NOTE — Progress Notes (Signed)
Physical Therapy Treatment Patient Details Name: Darlene Serrano MRN: [id number] DOB: 1935/02/28 Today's Date: 07/01/2013 Time: 1660-6301 PT Time Calculation (min): 26 min  PT Assessment / Plan / Recommendation  History of Present Illness     PT Comments   Pt continues very motivated but limited by orthostatic BP with OOB activity.    Follow Up Recommendations  SNF     Does the patient have the potential to tolerate intense rehabilitation     Barriers to Discharge        Equipment Recommendations  None recommended by PT    Recommendations for Other Services OT consult  Frequency 7X/week   Progress towards PT Goals Progress towards PT goals: Progressing toward goals  Plan Current plan remains appropriate    Precautions / Restrictions Precautions Precautions: Fall Restrictions Weight Bearing Restrictions: No Other Position/Activity Restrictions: WBAT   Pertinent Vitals/Pain 3/10 pain; BP with ambulation 87/49 RN aware    Mobility  Bed Mobility Overal bed mobility: Needs Assistance Bed Mobility: Supine to Sit Supine to sit: Min assist General bed mobility comments: cues for sequence and use of L LE to self assist Transfers Overall transfer level: Needs assistance Equipment used: Rolling walker (2 wheeled) Transfers: Sit to/from Stand Sit to Stand: Min assist General transfer comment: cues for LE management and use of UEs to self assist Ambulation/Gait Ambulation/Gait assistance: Min assist Ambulation Distance (Feet): 65 Feet Assistive device: Rolling walker (2 wheeled) Gait Pattern/deviations: Step-to pattern;Step-through pattern;Shuffle;Antalgic;Trunk flexed General Gait Details: cues for sequence, posture, position from RW - ltd by onset of dizziness    Exercises Total Joint Exercises Ankle Circles/Pumps: AROM;15 reps;Both;Supine Quad Sets: AROM;Both;10 reps;Supine Heel Slides: AAROM;Supine;Right;20 reps Hip ABduction/ADduction: AAROM;Right;Supine;20 reps    PT Diagnosis:    PT Problem List:   PT Treatment Interventions:     PT Goals (current goals can now be found in the care plan section) Acute Rehab PT Goals Patient Stated Goal: Rehab and home to resume previous lifestyle with decreased pain PT Goal Formulation: With patient Time For Goal Achievement: 07/07/13 Potential to Achieve Goals: Good  Visit Information  Last PT Received On: 07/01/13 Assistance Needed: +2 (orthostatic)    Subjective Data  Patient Stated Goal: Rehab and home to resume previous lifestyle with decreased pain   Cognition  Cognition Arousal/Alertness: Awake/alert Behavior During Therapy: WFL for tasks assessed/performed Overall Cognitive Status: Within Functional Limits for tasks assessed    Balance     End of Session PT - End of Session Equipment Utilized During Treatment: Gait belt Activity Tolerance: Other (comment) (BP 87/49) Patient left: in chair;with call bell/phone within reach;with family/visitor present Nurse Communication: Mobility status   GP     Darlene Serrano 07/01/2013, 12:39 PM

## 2013-07-01 NOTE — Progress Notes (Signed)
Physical Therapy Treatment Patient Details Name: PHARRAH ROTTMAN MRN: [id number] DOB: Jul 04, 1934 Today's Date: 07/01/2013 Time: 6295-2841 PT Time Calculation (min): 23 min  PT Assessment / Plan / Recommendation  History of Present Illness     PT Comments   No c/o dizziness with ambulation.  BP with amb 153/73  Follow Up Recommendations  SNF     Does the patient have the potential to tolerate intense rehabilitation     Barriers to Discharge        Equipment Recommendations  None recommended by PT    Recommendations for Other Services OT consult  Frequency 7X/week   Progress towards PT Goals Progress towards PT goals: Progressing toward goals  Plan Current plan remains appropriate    Precautions / Restrictions Precautions Precautions: Fall Restrictions Weight Bearing Restrictions: No Other Position/Activity Restrictions: WBAT   Pertinent Vitals/Pain 2/10    Mobility  Bed Mobility Overal bed mobility: Needs Assistance Bed Mobility: Sit to Supine Sit to supine: Min assist General bed mobility comments: cues for sequence and use of L LE to self assist Transfers Overall transfer level: Needs assistance Equipment used: Rolling walker (2 wheeled) Transfers: Sit to/from Stand Sit to Stand: Min assist General transfer comment: cues for LE management and use of UEs to self assist Ambulation/Gait Ambulation/Gait assistance: Min assist Ambulation Distance (Feet): 150 Feet (twice) Assistive device: Rolling walker (2 wheeled) Gait Pattern/deviations: Step-to pattern;Step-through pattern;Decreased step length - right;Decreased step length - left;Shuffle;Antalgic;Trunk flexed General Gait Details: cues for sequence, posture, position from RW - ltd by onset of dizziness    Exercises     PT Diagnosis:    PT Problem List:   PT Treatment Interventions:     PT Goals (current goals can now be found in the care plan section) Acute Rehab PT Goals Patient Stated Goal: Rehab and  home to resume previous lifestyle with decreased pain PT Goal Formulation: With patient Time For Goal Achievement: 07/07/13 Potential to Achieve Goals: Good  Visit Information  Last PT Received On: 07/01/13 Assistance Needed: +1    Subjective Data  Subjective: I feel much better Patient Stated Goal: Rehab and home to resume previous lifestyle with decreased pain   Cognition  Cognition Arousal/Alertness: Awake/alert Behavior During Therapy: WFL for tasks assessed/performed Overall Cognitive Status: Within Functional Limits for tasks assessed    Balance     End of Session PT - End of Session Equipment Utilized During Treatment: Gait belt Activity Tolerance: Patient tolerated treatment well Patient left: in bed;with call bell/phone within reach;with family/visitor present Nurse Communication: Mobility status   GP     Koltan Portocarrero 07/01/2013, 4:57 PM

## 2013-07-01 NOTE — Progress Notes (Signed)
   Subjective: 1 Day Post-Op Procedure(s) (LRB): RIGHT TOTAL HIP ARTHROPLASTY ANTERIOR APPROACH (Right) Patient reports pain as mild.   Patient seen in rounds by Dr. Wynelle Link. Patient is well, but has had some minor complaints of pain in the hip, requiring pain medications We will start therapy today.  Plan is to go [facility] after hospital stay.  Objective: Vital signs in last 24 hours: Temp:  [97.5 F (36.4 C)-98.3 F (36.8 C)] 98.3 F (36.8 C) (02/05 0510) Pulse Rate:  [62-78] 69 (02/05 0510) Resp:  [10-16] 16 (02/05 0510) BP: (102-133)/(40-75) 124/69 mmHg (02/05 0510) SpO2:  [100 %] 100 % (02/05 0510) Weight:  [57.6 kg (126 lb 15.8 oz)] 57.6 kg (126 lb 15.8 oz) (02/04 1111)  Intake/Output from previous day:  Intake/Output Summary (Last 24 hours) at 07/01/13 0736 Last data filed at 07/01/13 0600  Gross per 24 hour  Intake   4055 ml  Output   3580 ml  Net    475 ml    Intake/Output this shift: UOP 850 since MN +475  Labs:  Recent Labs  07/01/13 0442  HGB 9.4*    Recent Labs  07/01/13 0442  WBC 6.4  RBC 3.01*  HCT 28.5*  PLT 151    Recent Labs  07/01/13 0442  NA 142  K 4.0  CL 107  CO2 26  BUN 12  CREATININE 0.68  GLUCOSE 125*  CALCIUM 8.3*   No results found for this basename: LABPT, INR,  in the last 72 hours  EXAM General - Patient is Alert, Appropriate and Oriented Extremity - Neurovascular intact Sensation intact distally Dorsiflexion/Plantar flexion intact Dressing - dressing C/D/I Motor Function - intact, moving foot and toes well on exam.  Hemovac pulled without difficulty.  Past Medical History  Diagnosis Date  . Pure hypercholesterolemia   . Unspecified vitamin D deficiency   . Macular degeneration of both eyes     HX OF INJECTIONS INTO LEFT EYE ( ? AVASTIN ) FROM OCT 2012 TO OCT 2014.  . Cancer     LOCAL EXCISION SKIN CA FROM NOSE  06/11/13  . History of palpitations     YEARS AGO  . Impaired hearing   . Osteoporosis     . Arthritis     RIGHT HIP AND HANDS;  DDD    Assessment/Plan: 1 Day Post-Op Procedure(s) (LRB): RIGHT TOTAL HIP ARTHROPLASTY ANTERIOR APPROACH (Right) Principal Problem:   OA (osteoarthritis) of hip Active Problems:   Postoperative anemia due to acute blood loss  Estimated body mass index is 21.13 kg/(m^2) as calculated from the following:   Height as of this encounter: 5\' 5"  (1.651 m).   Weight as of this encounter: 57.6 kg (126 lb 15.8 oz). Advance diet Up with therapy Discharge to SNF - Plans for [facility] following the hospital stay.  DVT Prophylaxis - Xarelto Weight Bearing As Tolerated right Leg Hemovac Pulled Begin Therapy   Eleina Jergens 07/01/2013, 7:36 AM

## 2013-07-01 NOTE — Progress Notes (Signed)
CSW assisting with d/c planning. [employer] has provided prior authorization for SNF placement on 07/02/13 if pt is stable for d/c. Pt / family are aware. CSW will continue to follow to assist with d/c planning.  Werner Lean LCSW [phone]

## 2013-07-01 NOTE — Progress Notes (Signed)
OT Cancellation Note  Patient Details Name: Darlene Serrano MRN: [id number] DOB: 1934/09/10   Cancelled Treatment:    Reason Eval/Treat Not Completed: Medical issues which prohibited therapy. Per nursing pt is orthostatic and getting ready to receive bolus. Will try back later today as able or in am.  Jules Schick [phone] 07/01/2013, 12:01 PM

## 2013-07-02 LAB — BASIC METABOLIC PANEL
BUN: 9 mg/dL (ref 6–23)
CHLORIDE: 110 meq/L (ref 96–112)
CO2: 27 mEq/L (ref 19–32)
CREATININE: 0.58 mg/dL (ref 0.50–1.10)
Calcium: 8.5 mg/dL (ref 8.4–10.5)
GFR calc Af Amer: >90 mL/min (ref >90)
GFR calc non Af Amer: 87 mL/min — ABNORMAL LOW (ref >90)
GLUCOSE: 99 mg/dL (ref 70–99)
Potassium: 3.9 mEq/L (ref 3.7–5.3)
Sodium: 145 mEq/L (ref 137–147)

## 2013-07-02 LAB — CBC
HEMATOCRIT: 28 % — AB (ref 36.0–46.0)
HEMOGLOBIN: 9.3 g/dL — AB (ref 12.0–15.0)
MCH: 31.1 pg (ref 26.0–34.0)
MCHC: 33.2 g/dL (ref 30.0–36.0)
MCV: 93.6 fL (ref 78.0–100.0)
Platelets: 152 10*3/uL (ref 150–400)
RBC: 2.99 MIL/uL — ABNORMAL LOW (ref 3.87–5.11)
RDW: 13.8 % (ref 11.5–15.5)
WBC: 6.7 10*3/uL (ref 4.0–10.5)

## 2013-07-02 MED ORDER — OXYCODONE HCL 5 MG PO TABS: 5.0000 mg | ORAL_TABLET | ORAL | Status: DC | PRN

## 2013-07-02 MED ORDER — TRAMADOL HCL 50 MG PO TABS: 50.0000 mg | ORAL_TABLET | Freq: Four times a day (QID) | ORAL | Status: DC | PRN

## 2013-07-02 MED ORDER — ONDANSETRON HCL 4 MG PO TABS: 4.0000 mg | ORAL_TABLET | Freq: Four times a day (QID) | ORAL | Status: DC | PRN

## 2013-07-02 MED ORDER — METHOCARBAMOL 500 MG PO TABS: 500.0000 mg | ORAL_TABLET | Freq: Four times a day (QID) | ORAL | Status: DC | PRN

## 2013-07-02 MED ORDER — METOCLOPRAMIDE HCL 5 MG PO TABS: 5.0000 mg | ORAL_TABLET | Freq: Three times a day (TID) | ORAL | Status: DC | PRN

## 2013-07-02 MED ORDER — DSS 100 MG PO CAPS
100.0000 mg | ORAL_CAPSULE | Freq: Two times a day (BID) | ORAL | Status: DC
Start: 2013-07-02 — End: 2014-04-27

## 2013-07-02 MED ORDER — BISACODYL 10 MG RE SUPP: 10.0000 mg | Freq: Every day | RECTAL | Status: DC | PRN

## 2013-07-02 MED ORDER — RIVAROXABAN 10 MG PO TABS: 10.0000 mg | ORAL_TABLET | Freq: Every day | ORAL | Status: DC

## 2013-07-02 MED ORDER — POLYETHYLENE GLYCOL 3350 17 G PO PACK: 17.0000 g | PACK | Freq: Every day | ORAL | Status: DC | PRN

## 2013-07-02 MED ORDER — ACETAMINOPHEN 325 MG PO TABS: 650.0000 mg | ORAL_TABLET | Freq: Four times a day (QID) | ORAL | Status: DC | PRN

## 2013-07-02 NOTE — Discharge Summary (Signed)
Physician Discharge Summary   Patient ID: Darlene Serrano MRN: [id number] DOB/AGE: [birth year] 77 y.o.  Admit date: 06/30/2013 Discharge date: 07-02-2013  Primary Diagnosis:  Osteoarthritis of the Right hip.   Admission Diagnoses:  Past Medical History  Diagnosis Date  . Pure hypercholesterolemia   . Unspecified vitamin D deficiency   . Macular degeneration of both eyes     HX OF INJECTIONS INTO LEFT EYE ( ? AVASTIN ) FROM OCT 2012 TO OCT 2014.  . Cancer     LOCAL EXCISION SKIN CA FROM NOSE  06/11/13  . History of palpitations     YEARS AGO  . Impaired hearing   . Osteoporosis   . Arthritis     RIGHT HIP AND HANDS;  DDD   Discharge Diagnoses:   Principal Problem:   OA (osteoarthritis) of hip Active Problems:   Postoperative anemia due to acute blood loss  Estimated body mass index is 21.13 kg/(m^2) as calculated from the following:   Height as of this encounter: '5\' 5"'  (1.651 m).   Weight as of this encounter: 57.6 kg (126 lb 15.8 oz).  Procedure(s) (LRB): RIGHT TOTAL HIP ARTHROPLASTY ANTERIOR APPROACH (Right)   Consults: None  HPI: Darlene Serrano is a 79 y.o. female who has advanced end-  stage arthritis of his Right hip with progressively worsening pain and  dysfunction.The patient has failed nonoperative management and presents for  total hip arthroplasty.   Laboratory Data: Admission on 06/30/2013  Component Date Value Range Status  . WBC 07/01/2013 6.4  4.0 - 10.5 K/uL Final  . RBC 07/01/2013 3.01* 3.87 - 5.11 MIL/uL Final  . Hemoglobin 07/01/2013 9.4* 12.0 - 15.0 g/dL Final  . HCT 07/01/2013 28.5* 36.0 - 46.0 % Final  . MCV 07/01/2013 94.7  78.0 - 100.0 fL Final  . MCH 07/01/2013 31.2  26.0 - 34.0 pg Final  . MCHC 07/01/2013 33.0  30.0 - 36.0 g/dL Final  . RDW 07/01/2013 13.6  11.5 - 15.5 % Final  . Platelets 07/01/2013 151  150 - 400 K/uL Final  . Sodium 07/01/2013 142  137 - 147 mEq/L Final  . Potassium 07/01/2013 4.0  3.7 - 5.3 mEq/L Final  . Chloride  07/01/2013 107  96 - 112 mEq/L Final  . CO2 07/01/2013 26  19 - 32 mEq/L Final  . Glucose, Bld 07/01/2013 125* 70 - 99 mg/dL Final  . BUN 07/01/2013 12  6 - 23 mg/dL Final  . Creatinine, Ser 07/01/2013 0.68  0.50 - 1.10 mg/dL Final  . Calcium 07/01/2013 8.3* 8.4 - 10.5 mg/dL Final  . GFR calc non Af Amer 07/01/2013 83* >90 mL/min Final  . GFR calc Af Amer 07/01/2013 >90  >90 mL/min Final   Comment: (NOTE)                          The eGFR has been calculated using the CKD EPI equation.                          This calculation has not been validated in all clinical situations.                          eGFR's persistently <90 mL/min signify possible Chronic Kidney  Disease.  . WBC 07/02/2013 6.7  4.0 - 10.5 K/uL Final  . RBC 07/02/2013 2.99* 3.87 - 5.11 MIL/uL Final  . Hemoglobin 07/02/2013 9.3* 12.0 - 15.0 g/dL Final  . HCT 07/02/2013 28.0* 36.0 - 46.0 % Final  . MCV 07/02/2013 93.6  78.0 - 100.0 fL Final  . MCH 07/02/2013 31.1  26.0 - 34.0 pg Final  . MCHC 07/02/2013 33.2  30.0 - 36.0 g/dL Final  . RDW 07/02/2013 13.8  11.5 - 15.5 % Final  . Platelets 07/02/2013 152  150 - 400 K/uL Final  . Sodium 07/02/2013 145  137 - 147 mEq/L Final  . Potassium 07/02/2013 3.9  3.7 - 5.3 mEq/L Final  . Chloride 07/02/2013 110  96 - 112 mEq/L Final  . CO2 07/02/2013 27  19 - 32 mEq/L Final  . Glucose, Bld 07/02/2013 99  70 - 99 mg/dL Final  . BUN 07/02/2013 9  6 - 23 mg/dL Final  . Creatinine, Ser 07/02/2013 0.58  0.50 - 1.10 mg/dL Final  . Calcium 07/02/2013 8.5  8.4 - 10.5 mg/dL Final  . GFR calc non Af Amer 07/02/2013 87* >90 mL/min Final  . GFR calc Af Amer 07/02/2013 >90  >90 mL/min Final   Comment: (NOTE)                          The eGFR has been calculated using the CKD EPI equation.                          This calculation has not been validated in all clinical situations.                          eGFR's persistently <90 mL/min signify possible Chronic Kidney                           Disease.  Hospital Outpatient Visit on 06/23/2013  Component Date Value Range Status  . MRSA, PCR 06/23/2013 NEGATIVE  NEGATIVE Final  . Staphylococcus aureus 06/23/2013 NEGATIVE  NEGATIVE Final   Comment:                                 The Xpert SA Assay (FDA                          approved for NASAL specimens                          in patients over 60 years of age),                          is one component of                          a comprehensive surveillance                          program.  Test performance has                          been validated by [facility] for patients greater                          than or equal to 47 year old.                          It is not intended                          to diagnose infection nor to                          guide or monitor treatment.  Marland Kitchen aPTT 06/23/2013 28  24 - 37 seconds Final  . WBC 06/23/2013 6.1  4.0 - 10.5 K/uL Final  . RBC 06/23/2013 4.08  3.87 - 5.11 MIL/uL Final  . Hemoglobin 06/23/2013 12.7  12.0 - 15.0 g/dL Final  . HCT 06/23/2013 38.7  36.0 - 46.0 % Final  . MCV 06/23/2013 94.9  78.0 - 100.0 fL Final  . MCH 06/23/2013 31.1  26.0 - 34.0 pg Final  . MCHC 06/23/2013 32.8  30.0 - 36.0 g/dL Final  . RDW 06/23/2013 13.5  11.5 - 15.5 % Final  . Platelets 06/23/2013 199  150 - 400 K/uL Final  . Sodium 06/23/2013 138  137 - 147 mEq/L Final  . Potassium 06/23/2013 4.5  3.7 - 5.3 mEq/L Final  . Chloride 06/23/2013 101  96 - 112 mEq/L Final  . CO2 06/23/2013 26  19 - 32 mEq/L Final  . Glucose, Bld 06/23/2013 86  70 - 99 mg/dL Final  . BUN 06/23/2013 24* 6 - 23 mg/dL Final  . Creatinine, Ser 06/23/2013 0.76  0.50 - 1.10 mg/dL Final  . Calcium 06/23/2013 9.0  8.4 - 10.5 mg/dL Final  . Total Protein 06/23/2013 6.7  6.0 - 8.3 g/dL Final  . Albumin 06/23/2013 4.1  3.5 - 5.2 g/dL Final  . AST 06/23/2013 21  0 - 37 U/L Final  . ALT 06/23/2013 11  0 - 35 U/L Final  .  Alkaline Phosphatase 06/23/2013 63  39 - 117 U/L Final  . Total Bilirubin 06/23/2013 0.5  0.3 - 1.2 mg/dL Final  . GFR calc non Af Amer 06/23/2013 80* >90 mL/min Final  . GFR calc Af Amer 06/23/2013 >90  >90 mL/min Final   Comment: (NOTE)                          The eGFR has been calculated using the CKD EPI equation.                          This calculation has not been validated in all clinical situations.                          eGFR's persistently <90 mL/min signify possible Chronic Kidney                          Disease.  Marland Kitchen Prothrombin Time 06/23/2013 12.5  11.6 - 15.2 seconds Final  . INR 06/23/2013 0.95  0.00 - 1.49 Final  . ABO/RH(D) 06/23/2013 O POS   Final  . Antibody Screen 06/23/2013 NEG   Final  . Sample Expiration 06/23/2013 07/03/2013   Final  .  Color, Urine 06/23/2013 YELLOW  YELLOW Final  . APPearance 06/23/2013 CLEAR  CLEAR Final  . Specific Gravity, Urine 06/23/2013 1.007  1.005 - 1.030 Final  . pH 06/23/2013 6.0  5.0 - 8.0 Final  . Glucose, UA 06/23/2013 NEGATIVE  NEGATIVE mg/dL Final  . Hgb urine dipstick 06/23/2013 NEGATIVE  NEGATIVE Final  . Bilirubin Urine 06/23/2013 NEGATIVE  NEGATIVE Final  . Ketones, ur 06/23/2013 NEGATIVE  NEGATIVE mg/dL Final  . Protein, ur 06/23/2013 NEGATIVE  NEGATIVE mg/dL Final  . Urobilinogen, UA 06/23/2013 0.2  0.0 - 1.0 mg/dL Final  . Nitrite 06/23/2013 NEGATIVE  NEGATIVE Final  . Leukocytes, UA 06/23/2013 TRACE* NEGATIVE Final  . ABO/RH(D) 06/23/2013 O POS   Final  . Squamous Epithelial / LPF 06/23/2013 RARE  RARE Final  . WBC, UA 06/23/2013 0-2  <3 WBC/hpf Final     X-Rays:Dg Chest 2 View  06/23/2013   CLINICAL DATA:  Preop right hip arthroplasty  EXAM: CHEST  2 VIEW  COMPARISON:  10/28/2007  FINDINGS: The heart size and mediastinal contours are within normal limits. Both lungs are clear. The visualized skeletal structures are unremarkable.  IMPRESSION: No active cardiopulmonary disease.   Electronically Signed   By: Franchot Gallo M.D.   On: 06/23/2013 16:06   Dg Hip Complete Right  06/23/2013   CLINICAL DATA:  Preop osteoarthritis right hip  EXAM: RIGHT HIP - COMPLETE 2+ VIEW  COMPARISON:  None.  FINDINGS: Advanced osteoarthritis of the right hip joint with marked joint space narrowing, collapse of the femoral head, and extensive osteophyte formation. Possible chronic AVN. Left hip joint is normal.  IMPRESSION: Advanced degenerative change right hip.   Electronically Signed   By: Franchot Gallo M.D.   On: 06/23/2013 16:05   Dg Pelvis Portable  06/30/2013   CLINICAL DATA:  Postoperative from right total hip joint replacement  EXAM: PORTABLE PELVIS 1-2 VIEWS  COMPARISON:  None.  FINDINGS: A right hip joint prosthesis is present. Positioning appears adequate. The interface between the femoral component of the prosthesis and surrounding native bone is normal. The acetabular cup appears to be in adequate position. There is a surgical drain present.  IMPRESSION: The patient has undergone right hip joint prosthesis placement without evidence of immediate postprocedure complication.   Electronically Signed   By: David  Martinique   On: 06/30/2013 10:43   Dg C-arm 1-60 Min-no Report  06/30/2013   CLINICAL DATA: intraop   C-ARM 1-60 MINUTES  Fluoroscopy was utilized by the requesting physician.  No radiographic  interpretation.     EKG: Orders placed in visit on 04/13/13  . EKG 12-LEAD     Hospital Course: Patient was admitted to [facility] and taken to the OR and underwent the above state procedure without complications.  Patient tolerated the procedure well and was later transferred to the recovery room and then to the orthopaedic floor for postoperative care.  They were given PO and IV analgesics for pain control following their surgery.  They were given 24 hours of postoperative antibiotics of  Anti-infectives   Start     Dose/Rate Route Frequency Ordered Stop   06/30/13 2000  vancomycin (VANCOCIN) IVPB 1000 mg/200 mL  premix     1,000 mg 200 mL/hr over 60 Minutes Intravenous Every 12 hours 06/30/13 1137 06/30/13 2016   06/30/13 0708  vancomycin (VANCOCIN) IVPB 1000 mg/200 mL premix     1,000 mg 200 mL/hr over 60 Minutes Intravenous On call to O.R. 06/30/13 3419  06/30/13 0808     and started on DVT prophylaxis in the form of Xarelto.   PT and OT were ordered for total hip protocol.  The patient was allowed to be WBAT with therapy. Discharge planning was consulted to help with postop disposition and equipment needs.  She wanted to look into a SNF - [facility]  Social worker consulted to assist with placement.  Patient had a good night on the evening of surgery.  They started to get up OOB with therapy on day one.  Hemovac drain was pulled without difficulty.  Continued to work with therapy into day two.  Dressing was changed on day two and the incision was healing well.    Patient was seen in rounds by Dr. Wynelle Link and was ready to go to [facility].  Discharge to SNF  Diet - Cardiac diet  Follow up - in 2 weeks  Activity - WBAT  Disposition - Home  Condition Upon Discharge - Good  D/C Meds - See DC Summary  DVT Prophylaxis - Xarelto   Discharge Orders   Future Orders Complete By Expires   Call MD / Call 911  As directed    Comments:     If you experience chest pain or shortness of breath, CALL 911 and be transported to the hospital emergency room.  If you develope a fever above 101 F, pus (white drainage) or increased drainage or redness at the wound, or calf pain, call your surgeon's office.   Change dressing  As directed    Comments:     You may change your dressing dressing daily with sterile 4 x 4 inch gauze dressing and paper tape.  Do not submerge the incision under water.   Constipation Prevention  As directed    Comments:     Drink plenty of fluids.  Prune juice may be helpful.  You may use a stool softener, such as Colace (over the counter) 100 mg twice a day.  Use MiraLax (over the  counter) for constipation as needed.   Diet - low sodium heart healthy  As directed    Discharge instructions  As directed    Comments:     Pick up stool softner and laxative for home. Do not submerge incision under water. May shower. Continue to use ice for pain and swelling from surgery.  Total Hip Protocol. Patient is allowed to sit in a recliner chair.  Take Xarelto for two and a half more weeks, then discontinue Xarelto. Once the patient has completed the blood thinner regimen, then take a Baby 81 mg Aspirin daily for four more weeks.  When discharged from the skilled rehab facility, please have the facility set up the patient's [place] prior to being released.  Also provide the patient with their medications at time of release from the facility to include their pain medication, the muscle relaxants, and their blood thinner medication.  If the patient is still at the rehab facility at time of follow up appointment, please also assist the patient in arranging follow up appointment in our office and any transportation needs.   Do not sit on low chairs, stoools or toilet seats, as it may be difficult to get up from low surfaces  As directed    Driving restrictions  As directed    Comments:     No driving until released by the physician.   Increase activity slowly as tolerated  As directed    Lifting restrictions  As directed    Comments:     No lifting until released by the physician.   Patient may shower  As directed    Comments:     You may shower without a dressing once there is no drainage.  Do not wash over the wound.  If drainage remains, do not shower until drainage stops.   TED hose  As directed    Comments:     Use stockings (TED hose) for 3 weeks on both leg(s).  You may remove them at night for sleeping.   Weight bearing as tolerated  As directed    Questions:     Laterality:     Extremity:         Medication List    STOP taking these  medications       BILBERRY (VACCINIUM MYRTILLUS) PO     CALCIUM 600 600 MG Tabs tablet  Generic drug:  calcium carbonate     Lutein-Zeaxanthin 6-1 MG Tabs     naproxen sodium 220 MG tablet  Commonly known as:  ANAPROX     Omega 3 1000 MG Caps     vitamin C 500 MG tablet  Commonly known as:  ASCORBIC ACID     Vitamin D 1000 UNITS capsule      TAKE these medications       acetaminophen 325 MG tablet  Commonly known as:  TYLENOL  Take 2 tablets (650 mg total) by mouth every 6 (six) hours as needed for mild pain (or Fever >/= 101).     atorvastatin 10 MG tablet  Commonly known as:  LIPITOR  Take 10 mg by mouth every morning.     bisacodyl 10 MG suppository  Commonly known as:  DULCOLAX  Place 1 suppository (10 mg total) rectally daily as needed for moderate constipation.     doxylamine (Sleep) 25 MG tablet  Commonly known as:  UNISOM  Take 12.5 mg by mouth at bedtime.     DSS 100 MG Caps  Take 100 mg by mouth 2 (two) times daily.     fexofenadine 180 MG tablet  Commonly known as:  ALLEGRA  Take 180 mg by mouth daily.     ICAPS PO  Take 1 tablet by mouth 2 (two) times daily.     methocarbamol 500 MG tablet  Commonly known as:  ROBAXIN  Take 1 tablet (500 mg total) by mouth every 6 (six) hours as needed for muscle spasms.     metoCLOPramide 5 MG tablet  Commonly known as:  REGLAN  Take 1-2 tablets (5-10 mg total) by mouth every 8 (eight) hours as needed for nausea (if ondansetron (ZOFRAN) ineffective.).     ondansetron 4 MG tablet  Commonly known as:  ZOFRAN  Take 1 tablet (4 mg total) by mouth every 6 (six) hours as needed for nausea.     oxyCODONE 5 MG immediate release tablet  Commonly known as:  Oxy IR/ROXICODONE  Take 1-2 tablets (5-10 mg total) by mouth every 3 (three) hours as needed for moderate pain or severe pain.     polyethylene glycol packet  Commonly known as:  MIRALAX / GLYCOLAX  Take 17 g by mouth daily as needed for mild constipation.      rivaroxaban 10 MG Tabs tablet  Commonly known as:  XARELTO  - Take 1 tablet (10 mg total) by mouth daily with breakfast. Take Xarelto for two and a half more weeks, then discontinue Xarelto.  - Once  the patient has completed the blood thinner regimen, then take a Baby 81 mg Aspirin daily for four more weeks.     traMADol 50 MG tablet  Commonly known as:  ULTRAM  Take 1-2 tablets (50-100 mg total) by mouth every 6 (six) hours as needed (mild to moderate pain).           Follow-up Information   Follow up with Gearlean Alf, MD. Schedule an appointment as soon as possible for a visit in 2 weeks.   Specialty:  Orthopedic Surgery   Contact information:   [address] [place] [address] [phone]       Signed: Mickel Crow 07/02/2013, 9:12 AM

## 2013-07-02 NOTE — Progress Notes (Signed)
   Subjective: 2 Days Post-Op Procedure(s) (LRB): RIGHT TOTAL HIP ARTHROPLASTY ANTERIOR APPROACH (Right) Patient reports pain as mild.   Patient seen in rounds by Dr. Wynelle Link. Patient is well, and has had no acute complaints or problems Patient is ready to go [facility]  Objective: Vital signs in last 24 hours: Temp:  [96.7 F (35.9 C)-98.6 F (37 C)] 98.6 F (37 C) (02/06 0522) Pulse Rate:  [74-86] 77 (02/06 0522) Resp:  [16-17] 17 (02/06 0522) BP: (86-124)/(49-73) 124/72 mmHg (02/06 0522) SpO2:  [97 %-100 %] 97 % (02/06 0522)  Intake/Output from previous day:  Intake/Output Summary (Last 24 hours) at 07/02/13 0905 Last data filed at 07/02/13 0700  Gross per 24 hour  Intake 1769.92 ml  Output   4350 ml  Net -2580.08 ml    Intake/Output this shift:    Labs:  Recent Labs  07/01/13 0442 07/02/13 0415  HGB 9.4* 9.3*    Recent Labs  07/01/13 0442 07/02/13 0415  WBC 6.4 6.7  RBC 3.01* 2.99*  HCT 28.5* 28.0*  PLT 151 152    Recent Labs  07/01/13 0442 07/02/13 0415  NA 142 145  K 4.0 3.9  CL 107 110  CO2 26 27  BUN 12 9  CREATININE 0.68 0.58  GLUCOSE 125* 99  CALCIUM 8.3* 8.5   No results found for this basename: LABPT, INR,  in the last 72 hours  EXAM: General - Patient is Alert and Appropriate Extremity - Neurovascular intact Sensation intact distally Dorsiflexion/Plantar flexion intact Incision - clean, dry, no drainage Motor Function - intact, moving foot and toes well on exam.   Assessment/Plan: 2 Days Post-Op Procedure(s) (LRB): RIGHT TOTAL HIP ARTHROPLASTY ANTERIOR APPROACH (Right) Procedure(s) (LRB): RIGHT TOTAL HIP ARTHROPLASTY ANTERIOR APPROACH (Right) Past Medical History  Diagnosis Date  . Pure hypercholesterolemia   . Unspecified vitamin D deficiency   . Macular degeneration of both eyes     HX OF INJECTIONS INTO LEFT EYE ( ? AVASTIN ) FROM OCT 2012 TO OCT 2014.  . Cancer     LOCAL EXCISION SKIN CA FROM NOSE  06/11/13  .  History of palpitations     YEARS AGO  . Impaired hearing   . Osteoporosis   . Arthritis     RIGHT HIP AND HANDS;  DDD   Principal Problem:   OA (osteoarthritis) of hip Active Problems:   Postoperative anemia due to acute blood loss  Estimated body mass index is 21.13 kg/(m^2) as calculated from the following:   Height as of this encounter: 5\' 5"  (1.651 m).   Weight as of this encounter: 57.6 kg (126 lb 15.8 oz). Up with therapy Discharge to SNF Diet - Cardiac diet Follow up - in 2 weeks Activity - WBAT Disposition - Home Condition Upon Discharge - Good D/C Meds - See DC Summary DVT Prophylaxis - Xarelto  PERKINS, ALEXZANDREW 07/02/2013, 9:05 AM

## 2013-07-02 NOTE — Progress Notes (Signed)
Clinical Social Work Department CLINICAL SOCIAL WORK PLACEMENT NOTE 07/02/2013  Patient:  Darlene Serrano, Darlene Serrano  Account Number:  [id number] Admit date:  06/30/2013  Clinical Social Worker:  Werner Lean, LCSW  Date/time:  06/30/2013 02:35 PM  Clinical Social Work is seeking post-discharge placement for this patient at the following level of care:   SKILLED NURSING   (*CSW will update this form in Epic as items are completed)     Patient/family provided with [facility] Department of Clinical Social Work's list of facilities offering this level of care within the geographic area requested by the patient (or if unable, by the patient's family).  06/30/2013  Patient/family informed of their freedom to choose among providers that offer the needed level of care, that participate in Medicare, Medicaid or managed care program needed by the patient, have an available bed and are willing to accept the patient.    Patient/family informed of [facility]' ownership interest in [facility], as well as of the fact that they are under no obligation to receive care at this facility.  PASARR submitted to EDS on 06/30/2013 PASARR number received from EDS on 06/30/2013  FL2 transmitted to all facilities in geographic area requested by pt/family on  06/30/2013 FL2 transmitted to all facilities within larger geographic area on   Patient informed that his/her managed care company has contracts with or will negotiate with  certain facilities, including the following:     Patient/family informed of bed offers received:  06/30/2013 Patient chooses bed at [facility] Physician recommends and patient chooses bed at    Patient to be transferred to [place] on  07/02/2013 Patient to be transferred to facility by FAMILY  The following physician request were entered in Epic:   Additional Comments: [employer] medicare provided prior auth for sNF placement.  Werner Lean LCSW [phone]

## 2013-07-02 NOTE — Progress Notes (Signed)
Physical Therapy Treatment Patient Details Name: Darlene Serrano MRN: [id number] DOB: 10/29/1934 Today's Date: 07/02/2013 Time: 4401-0272 PT Time Calculation (min): 33 min  PT Assessment / Plan / Recommendation  History of Present Illness     PT Comments     Follow Up Recommendations  SNF     Does the patient have the potential to tolerate intense rehabilitation     Barriers to Discharge        Equipment Recommendations  None recommended by PT    Recommendations for Other Services OT consult  Frequency 7X/week   Progress towards PT Goals Progress towards PT goals: Progressing toward goals  Plan Current plan remains appropriate    Precautions / Restrictions Precautions Precautions: Fall Restrictions Weight Bearing Restrictions: No Other Position/Activity Restrictions: WBAT   Pertinent Vitals/Pain 3-4/10; MR requested, ice pack provided    Mobility  Bed Mobility Overal bed mobility: Needs Assistance Bed Mobility: Sit to Supine Supine to sit: Min assist Sit to supine: Min guard General bed mobility comments: cues for sequence and use of L LE to self assist Transfers Overall transfer level: Needs assistance Equipment used: Rolling walker (2 wheeled) Transfers: Sit to/from Stand Sit to Stand: Min assist General transfer comment: cues for LE management and use of UEs to self assist Ambulation/Gait Ambulation/Gait assistance: Min assist Ambulation Distance (Feet): 300 Feet Assistive device: Rolling walker (2 wheeled) Gait Pattern/deviations: Step-to pattern;Step-through pattern;Decreased step length - right;Decreased step length - left;Antalgic;Shuffle General Gait Details: cues for sequence, posture, position from RW - ltd by onset of dizziness    Exercises Total Joint Exercises Ankle Circles/Pumps: AROM;15 reps;Both;Supine Quad Sets: AROM;Both;10 reps;Supine Gluteal Sets: AROM;Both;10 reps;Supine Heel Slides: AAROM;Supine;Right;20 reps Hip ABduction/ADduction:  AAROM;Right;Supine;20 reps   PT Diagnosis:    PT Problem List:   PT Treatment Interventions:     PT Goals (current goals can now be found in the care plan section) Acute Rehab PT Goals Patient Stated Goal: Rehab and home to resume previous lifestyle with decreased pain PT Goal Formulation: With patient Time For Goal Achievement: 07/07/13 Potential to Achieve Goals: Good  Visit Information  Last PT Received On: 07/02/13 Assistance Needed: +1    Subjective Data  Patient Stated Goal: Rehab and home to resume previous lifestyle with decreased pain   Cognition  Cognition Arousal/Alertness: Awake/alert Behavior During Therapy: WFL for tasks assessed/performed Overall Cognitive Status: Within Functional Limits for tasks assessed    Balance     End of Session PT - End of Session Equipment Utilized During Treatment: Gait belt Activity Tolerance: Patient tolerated treatment well Patient left: in chair;with call bell/phone within reach Nurse Communication: Mobility status   GP     Keziah Drotar 07/02/2013, 10:39 AM

## 2013-07-02 NOTE — Care Management Note (Signed)
    Page 1 of 1   07/02/2013     10:32:55 AM   CARE MANAGEMENT NOTE 07/02/2013  Patient:  MAESON, LOURENCO   Account Number:  [id number]  Date Initiated:  07/02/2013  Documentation initiated by:  Sherrin Daisy  Subjective/Objective Assessment:   dx total rt hip replaqcemnt     Action/Plan:   Plans are for SNF rehab.   Anticipated DC Date:  07/02/2013   Anticipated DC Plan:  SKILLED NURSING FACILITY  In-house referral  Clinical Social Worker      DC Planning Services  CM consult      Choice offered to / List presented to:             Status of service:  Completed, signed off Medicare Important Message given?  NA - LOS <3 / Initial given by admissions (If response is "NO", the following Medicare IM given date fields will be blank) Date Medicare IM given:   Date Additional Medicare IM given:    Discharge Disposition:    Per UR Regulation:    If discussed at Long Length of Stay Meetings, dates discussed:    Comments:

## 2013-07-02 NOTE — Progress Notes (Signed)
OT Cancellation Note  Patient Details Name: Darlene Serrano MRN: [id number] DOB: 01/31/1935   Cancelled Treatment:    Reason Eval/Treat Not Completed: Other (comment)  Pt approved for rehab at snf.  Will defer OT eval to that venue.    Roald Lukacs 07/02/2013, 10:03 AM Lesle Chris, OTR/L [phone] 07/02/2013

## 2013-07-06 ENCOUNTER — Encounter: Payer: Self-pay | Admitting: Adult Health

## 2013-07-06 ENCOUNTER — Non-Acute Institutional Stay (SKILLED_NURSING_FACILITY): Payer: Medicare Other | Admitting: Adult Health

## 2013-07-06 DIAGNOSIS — M1990 Unspecified osteoarthritis, unspecified site: Secondary | ICD-10-CM

## 2013-07-06 DIAGNOSIS — Z96649 Presence of unspecified artificial hip joint: Secondary | ICD-10-CM

## 2013-07-06 DIAGNOSIS — E7800 Pure hypercholesterolemia, unspecified: Secondary | ICD-10-CM

## 2013-07-06 DIAGNOSIS — K5900 Constipation, unspecified: Secondary | ICD-10-CM

## 2013-07-06 DIAGNOSIS — Z96641 Presence of right artificial hip joint: Secondary | ICD-10-CM | POA: Insufficient documentation

## 2013-07-06 DIAGNOSIS — J45909 Unspecified asthma, uncomplicated: Secondary | ICD-10-CM

## 2013-07-06 HISTORY — DX: Presence of right artificial hip joint: Z96.641

## 2013-07-06 NOTE — Progress Notes (Signed)
Patient ID: Darlene Serrano, female   DOB: 1934-10-14, 79 y.o.   MRN: [id number]     [facility]  Allergies  Allergen Reactions  . Penicillins Swelling  . Codeine Itching    Chief Complaint  Patient presents with  . Hospitalization Follow-up    HPI:  She has been hospitalized for a right hip replacement. She is here for short term rehab with her goal to return back home. She is not voicing any complaints at this time. She is ambulating with a walker with therapy at this time.    Past Medical History  Diagnosis Date  . Pure hypercholesterolemia   . Unspecified vitamin D deficiency   . Macular degeneration of both eyes     HX OF INJECTIONS INTO LEFT EYE ( ? AVASTIN ) FROM OCT 2012 TO OCT 2014.  . Cancer     LOCAL EXCISION SKIN CA FROM NOSE  06/11/13  . History of palpitations     YEARS AGO  . Impaired hearing   . Osteoporosis   . Arthritis     RIGHT HIP AND HANDS;  DDD    Past Surgical History  Procedure Laterality Date  . Mohs surgery forehead for basal cell ca  2014  . Back surgery  1993    LUMBAR DISCECTOMY  . 1996 left knee arthroscopy    . 1987 tmj surgery    . Abdominal hysterectomy  1971  . Appendectomy  1964  . Total hip arthroplasty Right 06/30/2013    Procedure: RIGHT TOTAL HIP ARTHROPLASTY ANTERIOR APPROACH;  Surgeon: Gearlean Alf, MD;  Location: [facility];  Service: Orthopedics;  Laterality: Right;    VITAL SIGNS BP 145/77  Pulse 73  Ht 5\' 5"  (1.651 m)  Wt 130 lb 12.8 oz (59.33 kg)  BMI 21.77 kg/m2   Patient's Medications  New Prescriptions   No medications on file  Previous Medications   ACETAMINOPHEN (TYLENOL) 325 MG TABLET    Take 2 tablets (650 mg total) by mouth every 6 (six) hours as needed for mild pain (or Fever >/= 101).   ATORVASTATIN (LIPITOR) 10 MG TABLET    Take 10 mg by mouth every morning.   BISACODYL (DULCOLAX) 10 MG SUPPOSITORY    Place 1 suppository (10 mg total) rectally daily as needed for moderate constipation.   DOCUSATE  SODIUM 100 MG CAPS    Take 100 mg by mouth 2 (two) times daily.   FEXOFENADINE (ALLEGRA) 180 MG TABLET    Take 180 mg by mouth daily.   METHOCARBAMOL (ROBAXIN) 500 MG TABLET    Take 1 tablet (500 mg total) by mouth every 6 (six) hours as needed for muscle spasms.   METOCLOPRAMIDE (REGLAN) 5 MG TABLET    Take 1-2 tablets (5-10 mg total) by mouth every 8 (eight) hours as needed for nausea (if ondansetron (ZOFRAN) ineffective.).   MULTIPLE VITAMINS-MINERALS (ICAPS PO)    Take 1 tablet by mouth 2 (two) times daily.    ONDANSETRON (ZOFRAN) 4 MG TABLET    Take 1 tablet (4 mg total) by mouth every 6 (six) hours as needed for nausea.   OXYCODONE (OXY IR/ROXICODONE) 5 MG IMMEDIATE RELEASE TABLET    Take 1-2 tablets (5-10 mg total) by mouth every 3 (three) hours as needed for moderate pain or severe pain.   POLYETHYLENE GLYCOL (MIRALAX / GLYCOLAX) PACKET    Take 17 g by mouth daily as needed for mild constipation.   RIVAROXABAN (XARELTO) 10 MG TABS TABLET  Take 1 tablet (10 mg total) by mouth daily with breakfast. Take Xarelto for two and a half more weeks, then discontinue Xarelto. Once the patient has completed the blood thinner regimen, then take a Baby 81 mg Aspirin daily for four more weeks.   TRAMADOL (ULTRAM) 50 MG TABLET    Take 1-2 tablets (50-100 mg total) by mouth every 6 (six) hours as needed (mild to moderate pain).  Modified Medications   No medications on file  Discontinued Medications   No medications on file    SIGNIFICANT DIAGNOSTIC EXAMS   06-23-13: right hip x-ray Advanced degenerative change right hip.  06-23-13: chest x-ray: No active cardiopulmonary disease.  06-30-13: pelvic x-ray: The patient has undergone right hip joint prosthesis placement without evidence of immediate postprocedure complication.   LABS REVIEWED:   06-23-13: wbc 6.0; hgb 12.7;hct  38.7; mcv 94.9; plt 199; glucose 86; bun 24; creat 0.76; k+4.5; na++138; liver normal albumin 4.1 07-02-13: wbc 6.7; hgb 9.3;  hct 28.0; mcv 93.6;plt 152; glucose 99; bun 9; creat 0.58; k+3.9; na++ 145      Review of Systems  Constitutional: Negative for malaise/fatigue.  Respiratory: Negative for cough and shortness of breath.   Cardiovascular: Negative for chest pain, palpitations and leg swelling.  Gastrointestinal: Negative for heartburn, abdominal pain and constipation.  Musculoskeletal: Negative for joint pain and myalgias.  Skin: Negative.   Neurological: Negative for dizziness and weakness.  Psychiatric/Behavioral: Negative for depression. The patient does not have insomnia.     Physical Exam  Constitutional: She is oriented to person, place, and time. No distress.  thin  Neck: Neck supple. No JVD present.  Cardiovascular: Normal rate, regular rhythm and intact distal pulses.   Respiratory: Effort normal and breath sounds normal. No respiratory distress. She has no wheezes.  GI: Soft. Bowel sounds are normal. She exhibits no distension. There is no tenderness.  Musculoskeletal: She exhibits no edema.  Is able to move all extremities; is status post right hip replacement. Is ambulating with a walker.   Neurological: She is alert and oriented to person, place, and time.  Skin: Skin is warm and dry. She is not diaphoretic.  Incision line without signs of infection present.   Psychiatric: She has a normal mood and affect.     ASSESSMENT/ PLAN:  1. Osteoarthritis: is status post right hip replacement; will continue therapy as directed; will use tylenol as needed for pain; she does not want to use oxycodone for pain. Will continue to use ultram as needed for pain at this time.will continue robaxin 500 mg every 6 hours as needed  Will continue xarelto 10 mg daily for 2 1/2 weeks and asa 81 mg daily for 4 weeks. Will monitor   2.  Dyslipidemia will continue lipitor 10 mg daily   3. Constipation: will continue colace twice daily and miralax daily as needed  4. Asthma: she is stable will continue  allegra 180 mg daily.   Time spent with patient 50 minutes.

## 2013-07-08 ENCOUNTER — Non-Acute Institutional Stay (SKILLED_NURSING_FACILITY): Payer: Medicare Other | Admitting: Internal Medicine

## 2013-07-08 ENCOUNTER — Encounter: Payer: Self-pay | Admitting: Internal Medicine

## 2013-07-08 DIAGNOSIS — M1610 Unilateral primary osteoarthritis, unspecified hip: Secondary | ICD-10-CM

## 2013-07-08 DIAGNOSIS — E7800 Pure hypercholesterolemia, unspecified: Secondary | ICD-10-CM

## 2013-07-08 DIAGNOSIS — J309 Allergic rhinitis, unspecified: Secondary | ICD-10-CM | POA: Insufficient documentation

## 2013-07-08 DIAGNOSIS — D62 Acute posthemorrhagic anemia: Secondary | ICD-10-CM

## 2013-07-08 DIAGNOSIS — M169 Osteoarthritis of hip, unspecified: Secondary | ICD-10-CM

## 2013-07-08 NOTE — Progress Notes (Signed)
Patient ID: Darlene Serrano, female   DOB: 1935-01-21, 79 y.o.   MRN: [id number]    [facility]  Chief Complaint  Patient presents with  . Hospitalization Follow-up    new admit   Allergies  Allergen Reactions  . Penicillins Swelling  . Codeine Itching    PCP: Margarita Rana, MD  Code- full code  HPI 79 y/o female patient is here for STR after hospital admission from 06/30/13- 07/02/13. She had severe right hip OA and underwent total hip arthroplasty. Goal is for her to return home.  She is seen in her room today. She denies any complaints. She takes tylenol as need for pain. Does not want any narcotics. Denies any muscle spasms. Has been working well with therapy. Has appointment with orthopedic on 07/13/13. No concern from staff  Review of Systems  Constitutional: Negative for fever, chills, weight loss, malaise/fatigue and diaphoresis.  HENT: Negative for congestion, hearing loss and sore throat.   Eyes: Negative for blurred vision, double vision and discharge.  Respiratory: Negative for cough, sputum production, shortness of breath and wheezing.   Cardiovascular: Negative for chest pain, palpitations, orthopnea and leg swelling.  Gastrointestinal: Negative for heartburn, nausea, vomiting, abdominal pain, diarrhea and constipation.  Genitourinary: Negative for dysuria, urgency, frequency and flank pain.  Musculoskeletal: Negative for back pain, falls, joint pain and myalgias.  Skin: Negative for itching and rash.  Neurological: Negative for dizziness, tingling, focal weakness and headaches.  Psychiatric/Behavioral: Negative for depression and memory loss. The patient is not nervous/anxious.     Past Medical History  Diagnosis Date  . Pure hypercholesterolemia   . Unspecified vitamin D deficiency   . Macular degeneration of both eyes     HX OF INJECTIONS INTO LEFT EYE ( ? AVASTIN ) FROM OCT 2012 TO OCT 2014.  . Cancer     LOCAL EXCISION SKIN CA FROM NOSE  06/11/13    . History of palpitations     YEARS AGO  . Impaired hearing   . Osteoporosis   . Arthritis     RIGHT HIP AND HANDS;  DDD  . Asthma 08/30/2010   Past Surgical History  Procedure Laterality Date  . Mohs surgery forehead for basal cell ca  2014  . Back surgery  1993    LUMBAR DISCECTOMY  . 1996 left knee arthroscopy    . 1987 tmj surgery    . Abdominal hysterectomy  1971  . Appendectomy  1964  . Total hip arthroplasty Right 06/30/2013    Procedure: RIGHT TOTAL HIP ARTHROPLASTY ANTERIOR APPROACH;  Surgeon: Gearlean Alf, MD;  Location: [facility];  Service: Orthopedics;  Laterality: Right;   Social History:   reports that she has never smoked. She has never used smokeless tobacco. She reports that she does not drink alcohol or use illicit drugs.  No family history on file.  Medications: Patient's Medications  New Prescriptions   No medications on file  Previous Medications   ACETAMINOPHEN (TYLENOL) 325 MG TABLET    Take 2 tablets (650 mg total) by mouth every 6 (six) hours as needed for mild pain (or Fever >/= 101).   ATORVASTATIN (LIPITOR) 10 MG TABLET    Take 10 mg by mouth every morning.   BISACODYL (DULCOLAX) 10 MG SUPPOSITORY    Place 1 suppository (10 mg total) rectally daily as needed for moderate constipation.   DOCUSATE SODIUM 100 MG CAPS    Take 100 mg by mouth 2 (two) times daily.  FEXOFENADINE (ALLEGRA) 180 MG TABLET    Take 180 mg by mouth daily.   METHOCARBAMOL (ROBAXIN) 500 MG TABLET    Take 1 tablet (500 mg total) by mouth every 6 (six) hours as needed for muscle spasms.   METOCLOPRAMIDE (REGLAN) 5 MG TABLET    Take 1-2 tablets (5-10 mg total) by mouth every 8 (eight) hours as needed for nausea (if ondansetron (ZOFRAN) ineffective.).   MULTIPLE VITAMINS-MINERALS (ICAPS PO)    Take 1 tablet by mouth 2 (two) times daily.    ONDANSETRON (ZOFRAN) 4 MG TABLET    Take 1 tablet (4 mg total) by mouth every 6 (six) hours as needed for nausea.   OXYCODONE (OXY IR/ROXICODONE) 5  MG IMMEDIATE RELEASE TABLET    Take 1-2 tablets (5-10 mg total) by mouth every 3 (three) hours as needed for moderate pain or severe pain.   POLYETHYLENE GLYCOL (MIRALAX / GLYCOLAX) PACKET    Take 17 g by mouth daily as needed for mild constipation.   RIVAROXABAN (XARELTO) 10 MG TABS TABLET    Take 1 tablet (10 mg total) by mouth daily with breakfast. Take Xarelto for two and a half more weeks, then discontinue Xarelto. Once the patient has completed the blood thinner regimen, then take a Baby 81 mg Aspirin daily for four more weeks.   TRAMADOL (ULTRAM) 50 MG TABLET    Take 1-2 tablets (50-100 mg total) by mouth every 6 (six) hours as needed (mild to moderate pain).  Modified Medications   No medications on file  Discontinued Medications   No medications on file     Physical Exam: Filed Vitals:   07/08/13 1237  BP: 108/60  Pulse: 88  Temp: 97.3 F (36.3 C)  Resp: 16  SpO2: 96%   General- elderly female in no acute distress Head- atraumatic, normocephalic Eyes- PERRLA, EOMI, no pallor, no icterus, no discharge Neck- no lymphadenopathy, no thyromegaly, no jugular vein distension, no carotid bruit Cardiovascular- normal s1,s2, no murmurs/ rubs/ gallops Respiratory- bilateral clear to auscultation, no wheeze, no rhonchi, no crackles Abdomen- bowel sounds present, soft, non tender Musculoskeletal- able to move all 4 extremities, using a walker to get around, no spinal and paraspinal tenderness Neurological- no focal deficit Psychiatry- alert and oriented to person, place and time, normal mood and affect Skin- incision on right thigh area healing well, has surgical glue and steristrips in place and dressing above it is clean and dry   Labs reviewed: Basic Metabolic Panel:  Recent Labs  06/23/13 1410 07/01/13 0442 07/02/13 0415  NA 138 142 145  K 4.5 4.0 3.9  CL 101 107 110  CO2 26 26 27   GLUCOSE 86 125* 99  BUN 24* 12 9  CREATININE 0.76 0.68 0.58  CALCIUM 9.0 8.3* 8.5    Liver Function Tests:  Recent Labs  09/30/12 0946 04/13/13 1108 06/23/13 1410  AST 21 23 21   ALT 13 16 11   ALKPHOS 47 50 63  BILITOT 1.0 1.0 0.5  PROT 6.3 6.3 6.7  ALBUMIN 4.1 4.1 4.1   No results found for this basename: LIPASE, AMYLASE,  in the last 8760 hours No results found for this basename: AMMONIA,  in the last 8760 hours CBC:  Recent Labs  06/23/13 1410 07/01/13 0442 07/02/13 0415  WBC 6.1 6.4 6.7  HGB 12.7 9.4* 9.3*  HCT 38.7 28.5* 28.0*  MCV 94.9 94.7 93.6  PLT 199 151 152    Radiological Exams: Dg Chest 2 View  06/23/2013  CLINICAL DATA:  Preop right hip arthroplasty  EXAM: CHEST  2 VIEW  COMPARISON:  10/28/2007  FINDINGS: The heart size and mediastinal contours are within normal limits. Both lungs are clear. The visualized skeletal structures are unremarkable.  IMPRESSION: No active cardiopulmonary disease.   Electronically Signed   By: Franchot Gallo M.D.   On: 06/23/2013 16:06   Dg Hip Complete Right  06/23/2013   CLINICAL DATA:  Preop osteoarthritis right hip  EXAM: RIGHT HIP - COMPLETE 2+ VIEW  COMPARISON:  None.  FINDINGS: Advanced osteoarthritis of the right hip joint with marked joint space narrowing, collapse of the femoral head, and extensive osteophyte formation. Possible chronic AVN. Left hip joint is normal.  IMPRESSION: Advanced degenerative change right hip.   Electronically Signed   By: Franchot Gallo M.D.   On: 06/23/2013 16:05   Dg Pelvis Portable  06/30/2013   CLINICAL DATA:  Postoperative from right total hip joint replacement  EXAM: PORTABLE PELVIS 1-2 VIEWS  COMPARISON:  None.  FINDINGS: A right hip joint prosthesis is present. Positioning appears adequate. The interface between the femoral component of the prosthesis and surrounding native bone is normal. The acetabular cup appears to be in adequate position. There is a surgical drain present.  IMPRESSION: The patient has undergone right hip joint prosthesis placement without evidence of  immediate postprocedure complication.   Electronically Signed   By: David  Martinique   On: 06/30/2013 10:43   Dg C-arm 1-60 Min-no Report  06/30/2013   CLINICAL DATA: intraop   C-ARM 1-60 MINUTES  Fluoroscopy was utilized by the requesting physician.  No radiographic  interpretation.      Assessment/Plan  Osteoarthritis- s/p right hip replacement. Pain under control with tylenol. Will d/c oxycodone as pt has not required any. Will keep prn ultram and robaxin for now. Continue skin care. Follow up with orthopedic. Continue PT and OT with fall precautions. Will continue xarelto 10 mg daily for 2 1/2 weeks and asa 81 mg daily for 4 weeks.  Anemia- likely blood loss anemia during surgery. Recheck cbc in am.   Hyperlipidemia- continue lipitor   Constipation- continue colace twice daily and miralax daily as needed  Allergic rhinitis- continue allegra for now  Family/ staff Communication: reviewed care plan with patient and nursing supervisor  Goals of care: home after STR   Labs/tests ordered- cbc

## 2013-07-12 ENCOUNTER — Non-Acute Institutional Stay (SKILLED_NURSING_FACILITY): Payer: Medicare Other | Admitting: Adult Health

## 2013-07-12 DIAGNOSIS — D62 Acute posthemorrhagic anemia: Secondary | ICD-10-CM

## 2013-07-12 DIAGNOSIS — Z96641 Presence of right artificial hip joint: Secondary | ICD-10-CM

## 2013-07-12 DIAGNOSIS — M169 Osteoarthritis of hip, unspecified: Secondary | ICD-10-CM

## 2013-07-12 DIAGNOSIS — M1610 Unilateral primary osteoarthritis, unspecified hip: Secondary | ICD-10-CM

## 2013-07-12 DIAGNOSIS — Z96649 Presence of unspecified artificial hip joint: Secondary | ICD-10-CM

## 2013-07-13 ENCOUNTER — Encounter: Payer: Self-pay | Admitting: Adult Health

## 2013-07-13 NOTE — Progress Notes (Signed)
Patient ID: Darlene Serrano, female   DOB: 18-Apr-1935, 79 y.o.   MRN: [id number]     [facility]  Allergies  Allergen Reactions  . Penicillins Swelling  . Codeine Itching     Chief Complaint  Patient presents with  . Discharge Note    HPI:  She is being discharged to home with home health for pt/ot/nursing. She will need a front wheel walker in order for her to maintain her current level of independence. She will need a tub bench. She iwll need prescriptions to be written.  She had been hospitalized for a right hip replacement.   Past Medical History  Diagnosis Date  . Pure hypercholesterolemia   . Unspecified vitamin D deficiency   . Macular degeneration of both eyes     HX OF INJECTIONS INTO LEFT EYE ( ? AVASTIN ) FROM OCT 2012 TO OCT 2014.  . Cancer     LOCAL EXCISION SKIN CA FROM NOSE  06/11/13  . History of palpitations     YEARS AGO  . Impaired hearing   . Osteoporosis   . Arthritis     RIGHT HIP AND HANDS;  DDD  . Asthma 08/30/2010    Past Surgical History  Procedure Laterality Date  . Mohs surgery forehead for basal cell ca  2014  . Back surgery  1993    LUMBAR DISCECTOMY  . 1996 left knee arthroscopy    . 1987 tmj surgery    . Abdominal hysterectomy  1971  . Appendectomy  1964  . Total hip arthroplasty Right 06/30/2013    Procedure: RIGHT TOTAL HIP ARTHROPLASTY ANTERIOR APPROACH;  Surgeon: Gearlean Alf, MD;  Location: [facility];  Service: Orthopedics;  Laterality: Right;    VITAL SIGNS BP 108/60  Pulse 79  Ht 5\' 5"  (1.651 m)  Wt 130 lb (58.968 kg)  BMI 21.63 kg/m2   Patient's Medications  New Prescriptions   No medications on file  Previous Medications   ACETAMINOPHEN (TYLENOL) 325 MG TABLET    Take 2 tablets (650 mg total) by mouth every 6 (six) hours as needed for mild pain (or Fever >/= 101).   ATORVASTATIN (LIPITOR) 10 MG TABLET    Take 10 mg by mouth every morning.   BISACODYL (DULCOLAX) 10 MG SUPPOSITORY    Place 1 suppository (10 mg  total) rectally daily as needed for moderate constipation.   DOCUSATE SODIUM 100 MG CAPS    Take 100 mg by mouth 2 (two) times daily.   FEXOFENADINE (ALLEGRA) 180 MG TABLET    Take 180 mg by mouth daily.   METHOCARBAMOL (ROBAXIN) 500 MG TABLET    Take 1 tablet (500 mg total) by mouth every 6 (six) hours as needed for muscle spasms.   METOCLOPRAMIDE (REGLAN) 5 MG TABLET    Take 1-2 tablets (5-10 mg total) by mouth every 8 (eight) hours as needed for nausea (if ondansetron (ZOFRAN) ineffective.).   MULTIPLE VITAMINS-MINERALS (ICAPS PO)    Take 1 tablet by mouth 2 (two) times daily.    ONDANSETRON (ZOFRAN) 4 MG TABLET    Take 1 tablet (4 mg total) by mouth every 6 (six) hours as needed for nausea.   OXYCODONE (OXY IR/ROXICODONE) 5 MG IMMEDIATE RELEASE TABLET    Take 1-2 tablets (5-10 mg total) by mouth every 3 (three) hours as needed for moderate pain or severe pain.   POLYETHYLENE GLYCOL (MIRALAX / GLYCOLAX) PACKET    Take 17 g by mouth daily as needed for  mild constipation.   RIVAROXABAN (XARELTO) 10 MG TABS TABLET    Take 1 tablet (10 mg total) by mouth daily with breakfast. Take Xarelto for two and a half more weeks, then discontinue Xarelto. Once the patient has completed the blood thinner regimen, then take a Baby 81 mg Aspirin daily for four more weeks.   TRAMADOL (ULTRAM) 50 MG TABLET    Take 1-2 tablets (50-100 mg total) by mouth every 6 (six) hours as needed (mild to moderate pain).  Modified Medications   No medications on file  Discontinued Medications   No medications on file    SIGNIFICANT DIAGNOSTIC EXAMS  06-23-13: right hip x-ray Advanced degenerative change right hip.  06-23-13: chest x-ray: No active cardiopulmonary disease.  06-30-13: pelvic x-ray: The patient has undergone right hip joint prosthesis placement without evidence of immediate postprocedure complication.   LABS REVIEWED:   06-23-13: wbc 6.0; hgb 12.7;hct  38.7; mcv 94.9; plt 199; glucose 86; bun 24; creat 0.76;  k+4.5; na++138; liver normal albumin 4.1 07-02-13: wbc 6.7; hgb 9.3; hct 28.0; mcv 93.6;plt 152; glucose 99; bun 9; creat 0.58; k+3.9; na++ 145      Review of Systems  Constitutional: Negative for malaise/fatigue.  Respiratory: Negative for cough and shortness of breath.   Cardiovascular: Negative for chest pain, palpitations and leg swelling.  Gastrointestinal: Negative for heartburn, abdominal pain and constipation.  Musculoskeletal: Negative for joint pain and myalgias.  Skin: Negative.   Neurological: Negative for dizziness and weakness.  Psychiatric/Behavioral: Negative for depression. The patient does not have insomnia.     Physical Exam  Constitutional: She is oriented to person, place, and time. No distress.  thin  Neck: Neck supple. No JVD present.  Cardiovascular: Normal rate, regular rhythm and intact distal pulses.   Respiratory: Effort normal and breath sounds normal. No respiratory distress. She has no wheezes.  GI: Soft. Bowel sounds are normal. She exhibits no distension. There is no tenderness.  Musculoskeletal: She exhibits no edema.  Is able to move all extremities; is status post right hip replacement. Is ambulating with a walker.   Neurological: She is alert and oriented to person, place, and time.  Skin: Skin is warm and dry. She is not diaphoretic.    Psychiatric: She has a normal mood and affect.      ASSESSMENT/ PLAN:  Will discharge her to home with home health for pt/ot/nursing. Will need a front wheel walker; and tub bench. Her prescriptions have been written.   Time spent with patient 45 minutes.     Ok Edwards NP [facility]  Contact [phone] Monday through Friday 8am- 5pm  After hours call [phone]

## 2013-10-29 ENCOUNTER — Other Ambulatory Visit (INDEPENDENT_AMBULATORY_CARE_PROVIDER_SITE_OTHER): Payer: Medicare Other

## 2013-10-29 ENCOUNTER — Ambulatory Visit (INDEPENDENT_AMBULATORY_CARE_PROVIDER_SITE_OTHER): Payer: Medicare Other | Admitting: Cardiology

## 2013-10-29 ENCOUNTER — Encounter: Payer: Self-pay | Admitting: Cardiology

## 2013-10-29 VITALS — BP 135/80 | HR 71 | Ht 65.0 in | Wt 124.0 lb

## 2013-10-29 DIAGNOSIS — R002 Palpitations: Secondary | ICD-10-CM

## 2013-10-29 DIAGNOSIS — M1990 Unspecified osteoarthritis, unspecified site: Secondary | ICD-10-CM

## 2013-10-29 DIAGNOSIS — R42 Dizziness and giddiness: Secondary | ICD-10-CM

## 2013-10-29 DIAGNOSIS — E7800 Pure hypercholesterolemia, unspecified: Secondary | ICD-10-CM

## 2013-10-29 DIAGNOSIS — R5381 Other malaise: Secondary | ICD-10-CM | POA: Insufficient documentation

## 2013-10-29 DIAGNOSIS — R5383 Other fatigue: Principal | ICD-10-CM

## 2013-10-29 LAB — BASIC METABOLIC PANEL
BUN: 22 mg/dL (ref 6–23)
CHLORIDE: 107 meq/L (ref 96–112)
CO2: 28 mEq/L (ref 19–32)
Calcium: 9.2 mg/dL (ref 8.4–10.5)
Creatinine, Ser: 0.6 mg/dL (ref 0.4–1.2)
GFR: 105.2 mL/min (ref >60.00)
Glucose, Bld: 83 mg/dL (ref 70–99)
POTASSIUM: 4.6 meq/L (ref 3.5–5.1)
Sodium: 140 mEq/L (ref 135–145)

## 2013-10-29 LAB — HEPATIC FUNCTION PANEL
ALBUMIN: 3.9 g/dL (ref 3.5–5.2)
ALT: 14 U/L (ref 0–35)
AST: 21 U/L (ref 0–37)
Alkaline Phosphatase: 65 U/L (ref 39–117)
BILIRUBIN DIRECT: 0.1 mg/dL (ref 0.0–0.3)
Total Bilirubin: 0.6 mg/dL (ref 0.2–1.2)
Total Protein: 5.9 g/dL — ABNORMAL LOW (ref 6.0–8.3)

## 2013-10-29 LAB — LIPID PANEL
Cholesterol: 145 mg/dL (ref 0–200)
HDL: 63.1 mg/dL (ref >39.00)
LDL CALC: 77 mg/dL (ref 0–99)
NonHDL: 81.9
TRIGLYCERIDES: 25 mg/dL (ref 0.0–149.0)
Total CHOL/HDL Ratio: 2
VLDL: 5 mg/dL (ref 0.0–40.0)

## 2013-10-29 NOTE — Assessment & Plan Note (Signed)
The patient still is complaining of malaise and fatigue.  We will check thyroid function and a CBC today.  She does have a past history of post operative anemia.  She has not been having any hematochezia or melena

## 2013-10-29 NOTE — Patient Instructions (Signed)
Will obtain labs today and call you with the results  (lp/bmet/hfp/tsh/cbc)  Your physician recommends that you continue on your current medications as directed. Please refer to the Current Medication list given to you today.  Your physician wants you to follow-up in: 6 months with fasting labs (lp/bmet/hfp) You will receive a reminder letter in the mail two months in advance. If you don't receive a letter, please call our office to schedule the follow-up appointment.

## 2013-10-29 NOTE — Progress Notes (Signed)
Darlene Serrano Date of Birth:  1934/06/28 [facility] [address] [place], [place]  [address] [phone]  Fax   [phone]  HPI: This pleasant 79 -year-old woman is seen for a scheduled 6 month followup office visit. She has a history of hypercholesterolemia and she has a history of mild asthma. She has a remote history of palpitations. She has osteoarthritis of the right hip and underwent successful total hip replacement on 06/30/13 by Dr.Aluisio. Since last visit she's had no new cardiac symptoms.  She has been experiencing some malaise and fatigue.  She's also had some persistent constipation and is on stool softeners and Metamucil.  She has not had any recurrent SVT or palpitations.  She is now walking up to several miles a day on her new right hip prosthesis and is doing well.   Current Outpatient Prescriptions  Medication Sig Dispense Refill  . acetaminophen (TYLENOL) 325 MG tablet Take 2 tablets (650 mg total) by mouth every 6 (six) hours as needed for mild pain (or Fever >/= 101).  80 tablet  0  . atorvastatin (LIPITOR) 10 MG tablet Take 10 mg by mouth every morning.      . docusate sodium 100 MG CAPS Take 100 mg by mouth 2 (two) times daily.  60 capsule  0  . fexofenadine (ALLEGRA) 180 MG tablet Take 180 mg by mouth daily.      . Multiple Vitamins-Minerals (ICAPS PO) Take 1 tablet by mouth 2 (two) times daily.       . ondansetron (ZOFRAN) 4 MG tablet Take 1 tablet (4 mg total) by mouth every 6 (six) hours as needed for nausea.  40 tablet  0   No current facility-administered medications for this visit.    Allergies  Allergen Reactions  . Penicillins Swelling  . Codeine Itching    Patient Active Problem List   Diagnosis Date Noted  . Hypercholesterolemia 08/30/2010    Priority: Medium  . Malaise and fatigue 10/29/2013  . Allergic rhinitis 07/08/2013  . Status post right hip replacement 07/06/2013  . Postoperative anemia due to acute blood  loss 07/01/2013  . OA (osteoarthritis) of hip 06/29/2013  . Osteoarthritis (arthritis due to wear and tear of joints) 04/13/2013  . Rapid palpitations 09/30/2012  . Asthma 08/30/2010  . Macular degeneration 08/30/2010    History  Smoking status  . Never Smoker   Smokeless tobacco  . Never Used    History  Alcohol Use No    No family history on file.  Review of Systems: The patient denies any heat or cold intolerance.  No weight gain or weight loss.  The patient denies headaches or blurry vision.  There is no cough or sputum production.  The patient denies dizziness.  There is no hematuria or hematochezia.  The patient denies any muscle aches or arthritis.  The patient denies any rash.  The patient denies frequent falling or instability.  There is no history of depression or anxiety.  All other systems were reviewed and are negative.   Physical Exam: Filed Vitals:   10/29/13 0835  BP: 135/80  Pulse: 71   the general appearance reveals a well-developed well-nourished woman in no distress.The head and neck exam reveals pupils equal and reactive.  Extraocular movements are full.  There is no scleral icterus.  The mouth and pharynx are normal.  The neck is supple.  The carotids reveal no bruits.  The jugular venous pressure is normal.  The  thyroid is not enlarged.  There is no lymphadenopathy.  The chest is clear to percussion and auscultation.  There are no rales or rhonchi.  Expansion of the chest is symmetrical.  The precordium is quiet.  The first heart sound is normal.  The second heart sound is physiologically split.  There is no murmur gallop rub or click.  There is no abnormal lift or heave.  The abdomen is soft and nontender.  The bowel sounds are normal.  The liver and spleen are not enlarged.  There are no abdominal masses.  There are no abdominal bruits.  Extremities reveal good pedal pulses.  There is no phlebitis or edema.  There is no cyanosis or clubbing.  Strength is normal  and symmetrical in all extremities.  There is no lateralizing weakness.  There are no sensory deficits.  The skin is warm and dry.  There is no rash.      Assessment / Plan: 1.  Hypercholesterolemia 2. osteoarthritis status post successful right hip replacement February 2015 3. malaise and fatigue  Plan: Continue same medication.  Recheck in 6 months for office visit fasting lipid panel hepatic panel and basal metabolic panel. Today because of her fatigue we are also checking a CBC and a TSH.

## 2013-10-29 NOTE — Assessment & Plan Note (Signed)
Patient has a history of hypercholesterolemia.  She is on Lipitor 10 mg daily.  She is not having myalgias or side effects.

## 2013-10-29 NOTE — Assessment & Plan Note (Signed)
The patient has not been aware of any racing of her heart

## 2013-10-31 NOTE — Progress Notes (Signed)
Quick Note:  Please report to patient. The recent labs are stable. Continue same medication and careful diet. ______ 

## 2013-11-03 ENCOUNTER — Other Ambulatory Visit: Payer: Medicare Other

## 2013-11-03 LAB — CBC WITH DIFFERENTIAL/PLATELET
Basophils Absolute: 0 10*3/uL (ref 0.0–0.1)
Basophils Relative: 0.9 % (ref 0.0–3.0)
EOS ABS: 0.1 10*3/uL (ref 0.0–0.7)
Eosinophils Relative: 3.1 % (ref 0.0–5.0)
HCT: 36.6 % (ref 36.0–46.0)
HEMOGLOBIN: 12 g/dL (ref 12.0–15.0)
LYMPHS PCT: 32.9 % (ref 12.0–46.0)
Lymphs Abs: 1.6 10*3/uL (ref 0.7–4.0)
MCHC: 32.8 g/dL (ref 30.0–36.0)
MCV: 89.5 fl (ref 78.0–100.0)
Monocytes Absolute: 0.4 10*3/uL (ref 0.1–1.0)
Monocytes Relative: 7.7 % (ref 3.0–12.0)
Neutro Abs: 2.7 10*3/uL (ref 1.4–7.7)
Neutrophils Relative %: 55.4 % (ref 43.0–77.0)
Platelets: 195 10*3/uL (ref 150.0–400.0)
RBC: 4.09 Mil/uL (ref 3.87–5.11)
RDW: 17.2 % — ABNORMAL HIGH (ref 11.5–15.5)
WBC: 4.8 10*3/uL (ref 4.0–10.5)

## 2013-11-03 LAB — TSH: TSH: 1.82 u[IU]/mL (ref 0.35–4.50)

## 2013-11-03 LAB — BASIC METABOLIC PANEL
BUN: 21 mg/dL (ref 6–23)
CALCIUM: 9.5 mg/dL (ref 8.4–10.5)
CHLORIDE: 103 meq/L (ref 96–112)
CO2: 27 meq/L (ref 19–32)
Creatinine, Ser: 0.6 mg/dL (ref 0.4–1.2)
GFR: 101.23 mL/min (ref >60.00)
Glucose, Bld: 84 mg/dL (ref 70–99)
POTASSIUM: 4.2 meq/L (ref 3.5–5.1)
Sodium: 137 mEq/L (ref 135–145)

## 2013-11-03 LAB — HEPATIC FUNCTION PANEL
ALBUMIN: 4 g/dL (ref 3.5–5.2)
ALT: 14 U/L (ref 0–35)
AST: 28 U/L (ref 0–37)
Alkaline Phosphatase: 61 U/L (ref 39–117)
Bilirubin, Direct: 0.1 mg/dL (ref 0.0–0.3)
Total Bilirubin: 0.6 mg/dL (ref 0.2–1.2)
Total Protein: 6.1 g/dL (ref 6.0–8.3)

## 2013-11-03 LAB — LIPID PANEL
CHOL/HDL RATIO: 2
Cholesterol: 144 mg/dL (ref 0–200)
HDL: 62.3 mg/dL (ref >39.00)
LDL CALC: 71 mg/dL (ref 0–99)
NONHDL: 81.7
Triglycerides: 52 mg/dL (ref 0.0–149.0)
VLDL: 10.4 mg/dL (ref 0.0–40.0)

## 2013-11-04 NOTE — Progress Notes (Signed)
Quick Note:  Please report to patient. The recent labs are stable. Continue same medication and careful diet. The hemoglobin is back up almost to normal now. Hemoglobin is 12.0. Thyroid function is normal. ______

## 2014-04-27 ENCOUNTER — Other Ambulatory Visit (INDEPENDENT_AMBULATORY_CARE_PROVIDER_SITE_OTHER): Payer: Medicare Other | Admitting: *Deleted

## 2014-04-27 ENCOUNTER — Ambulatory Visit (INDEPENDENT_AMBULATORY_CARE_PROVIDER_SITE_OTHER): Payer: Medicare Other | Admitting: Cardiology

## 2014-04-27 VITALS — BP 126/64 | HR 70 | Ht 65.0 in | Wt 121.0 lb

## 2014-04-27 DIAGNOSIS — R5381 Other malaise: Secondary | ICD-10-CM

## 2014-04-27 DIAGNOSIS — E7800 Pure hypercholesterolemia, unspecified: Secondary | ICD-10-CM

## 2014-04-27 DIAGNOSIS — M1610 Unilateral primary osteoarthritis, unspecified hip: Secondary | ICD-10-CM

## 2014-04-27 DIAGNOSIS — R5383 Other fatigue: Secondary | ICD-10-CM

## 2014-04-27 DIAGNOSIS — R002 Palpitations: Secondary | ICD-10-CM

## 2014-04-27 NOTE — Assessment & Plan Note (Signed)
No recent palpitations. 

## 2014-04-27 NOTE — Assessment & Plan Note (Signed)
Doing well post total right hip replacement

## 2014-04-27 NOTE — Patient Instructions (Signed)
Your physician recommends that you continue on your current medications as directed. Please refer to the Current Medication list given to you today.  Your physician wants you to follow-up in: 6 months with fasting labs (lp/bmet/hfp) AND EKG  You will receive a reminder letter in the mail two months in advance. If you don't receive a letter, please call our office to schedule the follow-up appointment.  

## 2014-04-27 NOTE — Assessment & Plan Note (Signed)
Improved since last visit

## 2014-04-27 NOTE — Assessment & Plan Note (Signed)
" >>  ASSESSMENT AND PLAN FOR OA (OSTEOARTHRITIS) OF HIP WRITTEN ON 04/27/2014 11:14 AM BY DOMINICK NED, MD  Doing well post total right hip replacement "

## 2014-04-27 NOTE — Progress Notes (Signed)
Darlene Serrano Date of Birth:  Feb 17, 1935 [facility] [address] [place], [place]  [address] [phone]  Fax   [phone]  HPI: This pleasant 79 -year-old woman is seen for a scheduled 6 month followup office visit. She has a history of hypercholesterolemia and she has a history of mild asthma. She has a remote history of palpitations. She has osteoarthritis of the right hip and underwent successful total hip replacement on 06/30/13 by Dr.Aluisio. Since last visit she's had no new cardiac symptoms.  She has been experiencing some malaise and fatigue.  She's also had some persistent constipation and is on stool softeners and Metamucil.  Her constipation has improved since she started drinking 2 bottles of water each morning.  She has not had any recurrent SVT or palpitations.  She is now walking up to several miles a day on her new right hip prosthesis and is doing well.  She goes to fitness classes 3 times a week and she enjoys the classes and the fellowship. Current Outpatient Prescriptions  Medication Sig Dispense Refill  . acetaminophen (TYLENOL) 325 MG tablet Take 2 tablets (650 mg total) by mouth every 6 (six) hours as needed for mild pain (or Fever >/= 101). 80 tablet 0  . atorvastatin (LIPITOR) 10 MG tablet Take 10 mg by mouth every morning.    . Montelukast Sodium (SINGULAIR PO) Take by mouth daily.    . Multiple Vitamins-Minerals (ICAPS PO) Take 1 tablet by mouth 2 (two) times daily.     . Naproxen Sodium (ALEVE PO) Take by mouth.    . Sennosides (SENNO PO) Take by mouth 2 (two) times daily.     No current facility-administered medications for this visit.    Allergies  Allergen Reactions  . Penicillins Swelling  . Codeine Itching    Patient Active Problem List   Diagnosis Date Noted  . Hypercholesterolemia 08/30/2010    Priority: Medium  . Malaise and fatigue 10/29/2013  . Allergic rhinitis 07/08/2013  . Status post right hip replacement  07/06/2013  . Postoperative anemia due to acute blood loss 07/01/2013  . OA (osteoarthritis) of hip 06/29/2013  . Osteoarthritis (arthritis due to wear and tear of joints) 04/13/2013  . Rapid palpitations 09/30/2012  . Asthma 08/30/2010  . Macular degeneration 08/30/2010    History  Smoking status  . Never Smoker   Smokeless tobacco  . Never Used    History  Alcohol Use No    No family history on file.  Review of Systems: The patient denies any heat or cold intolerance.  No weight gain or weight loss.  The patient denies headaches or blurry vision.  There is no cough or sputum production.  The patient denies dizziness.  There is no hematuria or hematochezia.  The patient denies any muscle aches or arthritis.  The patient denies any rash.  The patient denies frequent falling or instability.  There is no history of depression or anxiety.  All other systems were reviewed and are negative.   Physical Exam: Filed Vitals:   04/27/14 0820  BP: 126/64  Pulse: 70   the general appearance reveals a well-developed well-nourished woman in no distress.The head and neck exam reveals pupils equal and reactive.  Extraocular movements are full.  There is no scleral icterus.  The mouth and pharynx are normal.  The neck is supple.  The carotids reveal no bruits.  The jugular venous pressure is normal.  The  thyroid is  not enlarged.  There is no lymphadenopathy.  The chest is clear to percussion and auscultation.  There are no rales or rhonchi.  Expansion of the chest is symmetrical.  The precordium is quiet.  The first heart sound is normal.  The second heart sound is physiologically split.  There is no murmur gallop rub or click.  There is no abnormal lift or heave.  The abdomen is soft and nontender.  The bowel sounds are normal.  The liver and spleen are not enlarged.  There are no abdominal masses.  There are no abdominal bruits.  Extremities reveal good pedal pulses.  There is no phlebitis or edema.   There is no cyanosis or clubbing.  Strength is normal and symmetrical in all extremities.  There is no lateralizing weakness.  There are no sensory deficits.  The skin is warm and dry.  There is no rash.      Assessment / Plan: 1.  Hypercholesterolemia 2. osteoarthritis status post successful right hip replacement February 2015 3. malaise and fatigue  Plan: Continue same medication.  Recheck in 6 months for office visit fasting lipid panel hepatic panel and basal metabolic panel.

## 2014-04-27 NOTE — Assessment & Plan Note (Signed)
Lipids are stable.  No myalgias.

## 2014-04-28 LAB — LIPID PANEL
CHOLESTEROL: 146 mg/dL (ref 0–200)
HDL: 52.4 mg/dL (ref >39.00)
LDL Cholesterol: 83 mg/dL (ref 0–99)
NonHDL: 93.6
TRIGLYCERIDES: 55 mg/dL (ref 0.0–149.0)
Total CHOL/HDL Ratio: 3
VLDL: 11 mg/dL (ref 0.0–40.0)

## 2014-04-28 LAB — BASIC METABOLIC PANEL
BUN: 19 mg/dL (ref 6–23)
CO2: 28 mEq/L (ref 19–32)
Calcium: 9.1 mg/dL (ref 8.4–10.5)
Chloride: 103 mEq/L (ref 96–112)
Creatinine, Ser: 0.8 mg/dL (ref 0.4–1.2)
GFR: 78.44 mL/min (ref >60.00)
Glucose, Bld: 86 mg/dL (ref 70–99)
POTASSIUM: 4.4 meq/L (ref 3.5–5.1)
SODIUM: 137 meq/L (ref 135–145)

## 2014-04-28 LAB — HEPATIC FUNCTION PANEL
ALT: 15 U/L (ref 0–35)
AST: 28 U/L (ref 0–37)
Albumin: 4.3 g/dL (ref 3.5–5.2)
Alkaline Phosphatase: 54 U/L (ref 39–117)
BILIRUBIN TOTAL: 1 mg/dL (ref 0.2–1.2)
Bilirubin, Direct: 0 mg/dL (ref 0.0–0.3)
TOTAL PROTEIN: 6.3 g/dL (ref 6.0–8.3)

## 2014-04-28 NOTE — Progress Notes (Signed)
Quick Note:  Please report to patient. The recent labs are stable. Continue same medication and careful diet. ______ 

## 2014-05-02 ENCOUNTER — Telehealth: Payer: Self-pay | Admitting: Cardiology

## 2014-05-02 NOTE — Telephone Encounter (Signed)
Advised patient

## 2014-05-02 NOTE — Telephone Encounter (Signed)
New Msg  Patient returning call. States Rip Harbour has the telephone number to reach her, declined to provide it.

## 2014-05-02 NOTE — Telephone Encounter (Signed)
-----   Message from Darlin Coco, MD sent at 04/28/2014  1:06 PM EST ----- Please report to patient.  The recent labs are stable. Continue same medication and careful diet.

## 2014-06-13 ENCOUNTER — Other Ambulatory Visit: Payer: Self-pay | Admitting: Cardiology

## 2014-09-18 NOTE — Op Note (Signed)
PATIENT NAME:  Darlene Serrano, Darlene Serrano MR#:  [id number] DATE OF BIRTH:  03-27-1935  DATE OF PROCEDURE:  11/26/2011  PREOPERATIVE DIAGNOSIS: Visually significant cataract of the right eye.   POSTOPERATIVE DIAGNOSIS: Visually significant cataract of the right eye.   OPERATIVE PROCEDURE: Cataract extraction by phacoemulsification with implant of intraocular lens to the right eye.   SURGEON: Birder Robson, MD.   ANESTHESIA:  1. Managed anesthesia care.  2. Topical tetracaine drops followed by 2% Xylocaine jelly applied in the preoperative holding area.   COMPLICATIONS: None.   TECHNIQUE:  Stop and chop.   DESCRIPTION OF PROCEDURE: The patient was examined and consented in the preoperative holding area where the aforementioned topical anesthesia was applied to the right eye and then brought back to the Operating Room where the right eye was prepped and draped in the usual sterile ophthalmic fashion and a lid speculum was placed. A paracentesis was created with the side port blade and the anterior chamber was filled with viscoelastic. A near clear corneal incision was performed with the steel keratome. A continuous curvilinear capsulorrhexis was performed with a cystotome followed by the capsulorrhexis forceps. Hydrodissection and hydrodelineation were carried out with BSS on a blunt cannula. The lens was removed in a stop and chop technique and the remaining cortical material was removed with the irrigation-aspiration handpiece. The capsular bag was inflated with viscoelastic and the Tecnis ZCB00 18.5-diopter lens, serial number [id number] was placed in the capsular bag without complication. The remaining viscoelastic was removed from the eye with the irrigation-aspiration handpiece. The wounds were hydrated. The anterior chamber was flushed with Miostat and the eye was inflated to physiologic pressure. The wounds were found to be water tight. The eye was dressed with Vigamox. The patient was given  protective glasses to wear throughout the day and a shield with which to sleep tonight. The patient was also given drops with which to begin a drop regimen today and will follow up with me in one day.    ____________________________ Livingston Diones. Sonjia Wilcoxson, MD wlp:bjt D: 11/26/2011 12:27:53 ET T: 11/26/2011 13:03:24 ET JOB#: [id number]  cc: Amara Manalang L. Tiyah Zelenak, MD, <Dictator> Livingston Diones Britt Petroni MD ELECTRONICALLY SIGNED 12/04/2011 9:58

## 2014-09-19 ENCOUNTER — Ambulatory Visit
Admit: 2014-09-19 | Disposition: A | Discharge status: Home / Self Care | Payer: Self-pay | Attending: Family Medicine | Admitting: Family Medicine

## 2014-10-31 ENCOUNTER — Ambulatory Visit (INDEPENDENT_AMBULATORY_CARE_PROVIDER_SITE_OTHER): Payer: Medicare Other | Admitting: Cardiology

## 2014-10-31 ENCOUNTER — Encounter: Payer: Self-pay | Admitting: Cardiology

## 2014-10-31 ENCOUNTER — Other Ambulatory Visit: Payer: Medicare Other

## 2014-10-31 VITALS — BP 126/80 | HR 63 | Ht 65.0 in | Wt 120.4 lb

## 2014-10-31 DIAGNOSIS — E7800 Pure hypercholesterolemia, unspecified: Secondary | ICD-10-CM

## 2014-10-31 DIAGNOSIS — M1610 Unilateral primary osteoarthritis, unspecified hip: Secondary | ICD-10-CM

## 2014-10-31 LAB — HEPATIC FUNCTION PANEL
ALT: 13 U/L (ref 0–35)
AST: 22 U/L (ref 0–37)
Albumin: 4.2 g/dL (ref 3.5–5.2)
Alkaline Phosphatase: 50 U/L (ref 39–117)
Bilirubin, Direct: 0.2 mg/dL (ref 0.0–0.3)
Total Bilirubin: 0.9 mg/dL (ref 0.2–1.2)
Total Protein: 6.5 g/dL (ref 6.0–8.3)

## 2014-10-31 LAB — BASIC METABOLIC PANEL
BUN: 16 mg/dL (ref 6–23)
CHLORIDE: 104 meq/L (ref 96–112)
CO2: 30 meq/L (ref 19–32)
Calcium: 9.3 mg/dL (ref 8.4–10.5)
Creatinine, Ser: 0.72 mg/dL (ref 0.40–1.20)
GFR: 83.38 mL/min (ref >60.00)
Glucose, Bld: 92 mg/dL (ref 70–99)
Potassium: 4.6 mEq/L (ref 3.5–5.1)
SODIUM: 138 meq/L (ref 135–145)

## 2014-10-31 LAB — LIPID PANEL
CHOL/HDL RATIO: 3
Cholesterol: 166 mg/dL (ref 0–200)
HDL: 65.2 mg/dL (ref >39.00)
LDL Cholesterol: 88 mg/dL (ref 0–99)
NonHDL: 100.8
TRIGLYCERIDES: 62 mg/dL (ref 0.0–149.0)
VLDL: 12.4 mg/dL (ref 0.0–40.0)

## 2014-10-31 NOTE — Progress Notes (Signed)
Quick Note:  Please report to patient. The recent labs are stable. Continue same medication and careful diet. ______ 

## 2014-10-31 NOTE — Progress Notes (Signed)
Cardiology Office Note   Date:  10/31/2014   ID:  ANIQA HARE, DOB June 19, 1934, MRN [id number]  PCP:  Margarita Rana, MD  Cardiologist: Darlin Coco MD  No chief complaint on file.     History of Present Illness: Darlene Serrano is a 80 y.o. female who presents for a six-month follow-up office visit  This pleasant 24 -year-old woman is seen for a scheduled 6 month followup office visit. She has a history of hypercholesterolemia and she has a history of mild asthma. She has a remote history of palpitations. She has osteoarthritis of the right hip and underwent successful total hip replacement on 06/30/13 by Dr.Aluisio. Since last visit she's had no new cardiac symptoms. She has been experiencing some malaise and fatigue. She's also had some persistent constipation and is on stool softeners and Metamucil. Her constipation has improved since she started drinking 2 bottles of water each morning. She has not had any recurrent SVT or palpitations. She is now walking up to several miles a day on her new right hip prosthesis and is doing well. She goes to fitness classes 3 times a week and she enjoys the classes and the fellowship. She has not been having any chest pain or shortness of breath.  No dizziness or syncope.  She did have some moderate problems with seasonal allergies earlier this spring.  Next year she will try Allegra during the spring.  Past Medical History  Diagnosis Date  . Pure hypercholesterolemia   . Unspecified vitamin D deficiency   . Macular degeneration of both eyes     HX OF INJECTIONS INTO LEFT EYE ( ? AVASTIN ) FROM OCT 2012 TO OCT 2014.  . Cancer     LOCAL EXCISION SKIN CA FROM NOSE  06/11/13  . History of palpitations     YEARS AGO  . Impaired hearing   . Osteoporosis   . Arthritis     RIGHT HIP AND HANDS;  DDD  . Asthma 08/30/2010    Past Surgical History  Procedure Laterality Date  . Mohs surgery forehead for basal cell ca  2014  . Back  surgery  1993    LUMBAR DISCECTOMY  . 1996 left knee arthroscopy    . 1987 tmj surgery    . Abdominal hysterectomy  1971  . Appendectomy  1964  . Total hip arthroplasty Right 06/30/2013    Procedure: RIGHT TOTAL HIP ARTHROPLASTY ANTERIOR APPROACH;  Surgeon: Gearlean Alf, MD;  Location: [facility] ORS;  Service: Orthopedics;  Laterality: Right;     Current Outpatient Prescriptions  Medication Sig Dispense Refill  . acetaminophen (TYLENOL) 325 MG tablet Take 2 tablets (650 mg total) by mouth every 6 (six) hours as needed for mild pain (or Fever >/= 101). 80 tablet 0  . atorvastatin (LIPITOR) 10 MG tablet Take 10 mg by mouth every morning.    . montelukast (SINGULAIR) 10 MG tablet Take 10 mg by mouth daily.  6  . Multiple Vitamins-Minerals (ICAPS PO) Take 1 tablet by mouth 2 (two) times daily.     . Naproxen Sodium (ALEVE PO) Take 220 mg by mouth 2 (two) times daily as needed (arthritis pain).     . Sennosides (SENNO PO) Take 1 tablet by mouth 2 (two) times daily.      No current facility-administered medications for this visit.    Allergies:   Penicillins and Codeine    Social History:  The patient  reports that she has never smoked.  She has never used smokeless tobacco. She reports that she does not drink alcohol or use illicit drugs.   Family History:  The patient's family history includes AAA (abdominal aortic aneurysm) in her father; Cancer in her father; Congestive Heart Failure in her mother.    ROS:  Please see the history of present illness.   Otherwise, review of systems are positive for none.   All other systems are reviewed and negative.    PHYSICAL EXAM: VS:  BP 126/80 mmHg  Pulse 63  Ht 5\' 5"  (1.651 m)  Wt 120 lb 6.4 oz (54.613 kg)  BMI 20.04 kg/m2 , BMI Body mass index is 20.04 kg/(m^2). GEN: Well nourished, well developed, in no acute distress HEENT: normal Neck: no JVD, carotid bruits, or masses Cardiac: RRR; no murmurs, rubs, or gallops,no edema  Respiratory:   clear to auscultation bilaterally, normal work of breathing GI: soft, nontender, nondistended, + BS MS: no deformity or atrophy.  She has a small lipoma on the medial aspect of the left ankle.  She does not have any pitting edema. Skin: warm and dry, no rash Neuro:  Strength and sensation are intact Psych: euthymic mood, full affect   EKG:  EKG is ordered today. The ekg ordered today demonstrates normal sinus rhythm.  Normal limits.   Recent Labs: 11/03/2013: Hemoglobin 12.0; Platelets 195.0; TSH 1.82 04/27/2014: ALT 15; BUN 19; Creatinine 0.8; Potassium 4.4; Sodium 137    Lipid Panel    Component Value Date/Time   CHOL 146 04/27/2014 0811   TRIG 55.0 04/27/2014 0811   HDL 52.40 04/27/2014 0811   CHOLHDL 3 04/27/2014 0811   VLDL 11.0 04/27/2014 0811   LDLCALC 83 04/27/2014 0811      Wt Readings from Last 3 Encounters:  10/31/14 120 lb 6.4 oz (54.613 kg)  04/27/14 121 lb (54.885 kg)  10/29/13 124 lb (56.246 kg)         ASSESSMENT AND PLAN:  1. Hypercholesterolemia 2. osteoarthritis status post successful right hip replacement February 2015   Plan: Continue same medication. Recheck in 6 months for office visit fasting lipid panel hepatic panel and basal metabolic panel.   Current medicines are reviewed at length with the patient today.  The patient does not have concerns regarding medicines.  The following changes have been made:  no change  Labs/ tests ordered today include:   Orders Placed This Encounter  Procedures  . Lipid panel  . Hepatic function panel  . Basic metabolic panel  . EKG 12-Lead    Continue current medication.  Continue regular exercise.  Recheck in 6 months for office visit panel hepatic function panel and basal metabolic panel.  Berna Spare MD 10/31/2014 8:34 AM    [address], [place], [place]  [address] Phone: [phone]; Fax: [phone]

## 2014-10-31 NOTE — Patient Instructions (Signed)
Medication Instructions:  Your physician recommends that you continue on your current medications as directed. Please refer to the Current Medication list given to you today.  Labwork: Lp/bbmet/hfp   Testing/Procedures: none  Follow-Up: Your physician wants you to follow-up in: 6 months with fasting labs (lp/bmet/hfp)  You will receive a reminder letter in the mail two months in advance. If you don't receive a letter, please call our office to schedule the follow-up appointment.

## 2014-11-14 ENCOUNTER — Other Ambulatory Visit: Payer: Self-pay | Admitting: Family Medicine

## 2014-11-14 DIAGNOSIS — J309 Allergic rhinitis, unspecified: Secondary | ICD-10-CM

## 2015-01-10 ENCOUNTER — Other Ambulatory Visit: Payer: Self-pay | Admitting: Cardiology

## 2015-01-12 ENCOUNTER — Other Ambulatory Visit: Payer: Self-pay | Admitting: Family Medicine

## 2015-01-12 NOTE — Telephone Encounter (Signed)
This is a pt of Dr. Sharyon Medicus Last ov was on 09/17/2014.  Thanks,

## 2015-01-25 ENCOUNTER — Encounter: Payer: Self-pay | Admitting: Family Medicine

## 2015-01-25 ENCOUNTER — Ambulatory Visit (INDEPENDENT_AMBULATORY_CARE_PROVIDER_SITE_OTHER): Payer: Medicare Other | Admitting: Family Medicine

## 2015-01-25 ENCOUNTER — Other Ambulatory Visit: Payer: Self-pay

## 2015-01-25 VITALS — BP 116/70 | HR 72 | Temp 98.0°F | Resp 16 | Ht 65.0 in | Wt 123.0 lb

## 2015-01-25 DIAGNOSIS — J019 Acute sinusitis, unspecified: Secondary | ICD-10-CM | POA: Insufficient documentation

## 2015-01-25 DIAGNOSIS — J018 Other acute sinusitis: Secondary | ICD-10-CM

## 2015-01-25 DIAGNOSIS — J309 Allergic rhinitis, unspecified: Secondary | ICD-10-CM

## 2015-01-25 DIAGNOSIS — K5900 Constipation, unspecified: Secondary | ICD-10-CM | POA: Insufficient documentation

## 2015-01-25 MED ORDER — MONTELUKAST SODIUM 10 MG PO TABS: 10.0000 mg | ORAL_TABLET | Freq: Every day | ORAL | Status: DC

## 2015-01-25 MED ORDER — DOXYCYCLINE HYCLATE 100 MG PO TABS: 100.0000 mg | ORAL_TABLET | Freq: Two times a day (BID) | ORAL | Status: DC

## 2015-01-25 NOTE — Progress Notes (Signed)
Subjective:    Patient ID: Darlene Serrano, female    DOB: 1934/11/11, 80 y.o.   MRN: [id number]  URI  This is a new problem. The current episode started more than 1 month ago (started with sinus pressure). The problem has been gradually worsening. Maximum temperature: pt felt feverish, but has not checked temperature. Associated symptoms include chest pain (tightness), congestion, coughing (productive, green sputum), headaches, rhinorrhea and sinus pain (frontal). Pertinent negatives include no abdominal pain, diarrhea, dysuria, ear pain, joint pain, joint swelling, nausea, neck pain, plugged ear sensation, rash, sneezing, sore throat, swollen glands, vomiting or wheezing.      Review of Systems  HENT: Positive for congestion and rhinorrhea. Negative for ear pain, sneezing and sore throat.   Respiratory: Positive for cough (productive, green sputum). Negative for wheezing.   Cardiovascular: Positive for chest pain (tightness).  Gastrointestinal: Negative for nausea, vomiting, abdominal pain and diarrhea.  Genitourinary: Negative for dysuria.  Musculoskeletal: Negative for joint pain and neck pain.  Skin: Negative for rash.  Neurological: Positive for headaches.     Patient Active Problem List   Diagnosis Date Noted  . CN (constipation) 01/25/2015  . Malaise and fatigue 10/29/2013  . Allergic rhinitis 07/08/2013  . Status post right hip replacement 07/06/2013  . Postoperative anemia due to acute blood loss 07/01/2013  . OA (osteoarthritis) of hip 06/29/2013  . Osteoarthritis (arthritis due to wear and tear of joints) 04/13/2013  . Rapid palpitations 09/30/2012  . Hypercholesterolemia 08/30/2010  . Asthma 08/30/2010  . Macular degeneration 08/30/2010  . Acid reflux 04/13/2009  . Hypercholesteremia 04/13/2009  . Auditory vertigo 04/13/2009  . Arthritis, degenerative 04/13/2009   Past Medical History  Diagnosis Date  . Pure hypercholesterolemia   . Unspecified vitamin D  deficiency   . Macular degeneration of both eyes     HX OF INJECTIONS INTO LEFT EYE ( ? AVASTIN ) FROM OCT 2012 TO OCT 2014.  . Cancer     LOCAL EXCISION SKIN CA FROM NOSE  06/11/13  . History of palpitations     YEARS AGO  . Impaired hearing   . Osteoporosis   . Arthritis     RIGHT HIP AND HANDS;  DDD  . Asthma 08/30/2010   Current Outpatient Prescriptions on File Prior to Visit  Medication Sig  . acetaminophen (TYLENOL) 325 MG tablet Take 2 tablets (650 mg total) by mouth every 6 (six) hours as needed for mild pain (or Fever >/= 101).  Marland Kitchen atorvastatin (LIPITOR) 10 MG tablet Take 10 mg by mouth every morning.  . Multiple Vitamins-Minerals (ICAPS PO) Take 1 tablet by mouth 2 (two) times daily.   . montelukast (SINGULAIR) 10 MG tablet TAKE 1 TABLET BY MOUTH EVERY DAY (Patient not taking: Reported on 01/25/2015)  . Sennosides (SENNO PO) Take 1 tablet by mouth 2 (two) times daily.    No current facility-administered medications on file prior to visit.   Allergies  Allergen Reactions  . Penicillins Swelling  . Codeine Itching   Past Surgical History  Procedure Laterality Date  . Mohs surgery forehead for basal cell ca  2014  . Back surgery  1993    LUMBAR DISCECTOMY  . 1996 left knee arthroscopy    . 1987 tmj surgery    . Abdominal hysterectomy  1971  . Appendectomy  1964  . Total hip arthroplasty Right 06/30/2013    Procedure: RIGHT TOTAL HIP ARTHROPLASTY ANTERIOR APPROACH;  Surgeon: Gearlean Alf, MD;  Location: [facility];  Service: Orthopedics;  Laterality: Right;   Social History   Social History  . Marital Status: Divorced    Spouse Name: N/A  . Number of Children: N/A  . Years of Education: N/A   Occupational History  . Not on file.   Social History Main Topics  . Smoking status: Never Smoker   . Smokeless tobacco: Never Used  . Alcohol Use: No  . Drug Use: No  . Sexual Activity: Not on file   Other Topics Concern  . Not on file   Social History Narrative    Family History  Problem Relation Age of Onset  . Congestive Heart Failure Mother   . AAA (abdominal aortic aneurysm) Father   . Cancer Father        Objective:   Physical Exam  Constitutional: She is oriented to person, place, and time. She appears well-developed and well-nourished.  HENT:  Head: Normocephalic and atraumatic.  Right Ear: Tympanic membrane and external ear normal.  Left Ear: Tympanic membrane and external ear normal.  Nose: Mucosal edema and rhinorrhea present. Right sinus exhibits maxillary sinus tenderness. Left sinus exhibits maxillary sinus tenderness.  Mouth/Throat: Uvula is midline and oropharynx is clear and moist.  Eyes: Conjunctivae and EOM are normal. Pupils are equal, round, and reactive to light.  Neck: Normal range of motion. Neck supple.  Cardiovascular: Normal rate and regular rhythm.   Pulmonary/Chest: Effort normal and breath sounds normal. She has no wheezes. She has no rales.  Neurological: She is alert and oriented to person, place, and time.    BP 116/70 mmHg  Pulse 72  Temp(Src) 98 F (36.7 C) (Oral)  Resp 16  Ht 5\' 5"  (1.651 m)  Wt 123 lb (55.792 kg)  BMI 20.47 kg/m2  SpO2 97%       Assessment & Plan:  1. Allergic rhinitis, unspecified allergic rhinitis type Restart Singulair for better symptoms control. Patient instructed to call back if condition worsens or does not improve.    - montelukast (SINGULAIR) 10 MG tablet; Take 1 tablet (10 mg total) by mouth daily.  Dispense: 30 tablet; Refill: 0  2. Other acute sinusitis Condition is worsening. Will start medication for better control.   - doxycycline (VIBRA-TABS) 100 MG tablet; Take 1 tablet (100 mg total) by mouth 2 (two) times daily.  Dispense: 20 tablet; Refill: 0  Margarita Rana, MD

## 2015-02-06 ENCOUNTER — Other Ambulatory Visit: Payer: Self-pay | Admitting: Family Medicine

## 2015-02-06 DIAGNOSIS — J018 Other acute sinusitis: Secondary | ICD-10-CM

## 2015-02-06 MED ORDER — DOXYCYCLINE HYCLATE 100 MG PO TABS: 100.0000 mg | ORAL_TABLET | Freq: Two times a day (BID) | ORAL | Status: DC

## 2015-02-06 NOTE — Telephone Encounter (Signed)
Pt contacted office for refill request on the following medications: doxycycline (VIBRA-TABS) 100 MG tablet to [facility] Pao stated she has finished the first round of medication and is feeling a little better but she thinks she needs another round of medication to get well. Last OV was 01/25/15 and RX was written on 01/25/15. Please advise.

## 2015-03-02 ENCOUNTER — Ambulatory Visit (INDEPENDENT_AMBULATORY_CARE_PROVIDER_SITE_OTHER): Payer: Medicare Other

## 2015-03-02 DIAGNOSIS — Z23 Encounter for immunization: Secondary | ICD-10-CM | POA: Diagnosis not present

## 2015-04-10 ENCOUNTER — Other Ambulatory Visit: Payer: Self-pay | Admitting: Cardiology

## 2015-05-04 ENCOUNTER — Encounter: Payer: Self-pay | Admitting: Family Medicine

## 2015-05-04 ENCOUNTER — Ambulatory Visit (INDEPENDENT_AMBULATORY_CARE_PROVIDER_SITE_OTHER): Payer: Medicare Other | Admitting: Family Medicine

## 2015-05-04 VITALS — BP 110/68 | HR 72 | Temp 98.5°F | Resp 16 | Wt 122.0 lb

## 2015-05-04 DIAGNOSIS — Z0000 Encounter for general adult medical examination without abnormal findings: Secondary | ICD-10-CM

## 2015-05-04 NOTE — Progress Notes (Signed)
Patient ID: Darlene Serrano, female   DOB: 1934/08/06, 80 y.o.   MRN: [id number]       Patient: Darlene Serrano, Female    DOB: [birth year], 80 y.o.   MRN: [id number] Visit Date: 05/04/2015  Today's Provider: Margarita Rana, MD   Chief Complaint  Patient presents with  . Annual Exam   Subjective:    Annual wellness visit Darlene Serrano is a 80 y.o. female. She feels well. She reports exercising at least 3 times a week. She reports she is sleeping well. Patient reports that she sleeps on average 8 hours a night. Last: Colonoscopy- 08/18/2012. Diverticulosis.   Tdap- 02/09/2011  PCV13- 04/15/2014  Mammogram- Last year per patient.   BMD- 05/23/2009. Normal. Repeat in 2 years.   -----------------------------------------------------------   Review of Systems  Constitutional: Negative.   HENT: Negative.   Eyes: Negative.   Respiratory: Negative.   Cardiovascular: Negative.   Gastrointestinal: Negative.   Endocrine: Negative.   Genitourinary: Negative.   Musculoskeletal: Negative.   Skin: Negative.   Allergic/Immunologic: Negative.   Neurological: Negative.   Hematological: Negative.   Psychiatric/Behavioral: Negative.     Social History   Social History  . Marital Status: Divorced    Spouse Name: N/A  . Number of Children: N/A  . Years of Education: N/A   Occupational History  . Not on file.   Social History Main Topics  . Smoking status: Never Smoker   . Smokeless tobacco: Never Used  . Alcohol Use: No  . Drug Use: No  . Sexual Activity: Not on file   Other Topics Concern  . Not on file   Social History Narrative    Patient Active Problem List   Diagnosis Date Noted  . CN (constipation) 01/25/2015  . Sinusitis, acute 01/25/2015  . Malaise and fatigue 10/29/2013  . Allergic rhinitis 07/08/2013  . Status post right hip replacement 07/06/2013  . Postoperative anemia due to acute blood loss 07/01/2013  . OA (osteoarthritis) of hip 06/29/2013  .  Osteoarthritis (arthritis due to wear and tear of joints) 04/13/2013  . Rapid palpitations 09/30/2012  . Hypercholesterolemia 08/30/2010  . Asthma 08/30/2010  . Macular degeneration 08/30/2010  . Acid reflux 04/13/2009  . Hypercholesteremia 04/13/2009  . Auditory vertigo 04/13/2009  . Arthritis, degenerative 04/13/2009    Past Surgical History  Procedure Laterality Date  . Mohs surgery forehead for basal cell ca  2014  . Back surgery  1993    LUMBAR DISCECTOMY  . 1996 left knee arthroscopy    . 1987 tmj surgery    . Abdominal hysterectomy  1971  . Appendectomy  1964  . Total hip arthroplasty Right 06/30/2013    Procedure: RIGHT TOTAL HIP ARTHROPLASTY ANTERIOR APPROACH;  Surgeon: Gearlean Alf, MD;  Location: [facility];  Service: Orthopedics;  Laterality: Right;    Her family history includes AAA (abdominal aortic aneurysm) in her father; Cancer in her father; Congestive Heart Failure in her mother.    Previous Medications   ACETAMINOPHEN (TYLENOL) 325 MG TABLET    Take 2 tablets (650 mg total) by mouth every 6 (six) hours as needed for mild pain (or Fever >/= 101).   ASCORBIC ACID (VITAMIN C) 500 MG TABLET    Take by mouth.   ATORVASTATIN (LIPITOR) 10 MG TABLET    Take 10 mg by mouth every morning.   ATORVASTATIN (LIPITOR) 10 MG TABLET    TAKE 1 TABLET (10 MG TOTAL) BY MOUTH DAILY.   CALCIUM  CARBONATE (OS-CAL) 600 MG TABS TABLET       CHOLECALCIFEROL (VITAMIN D) 1000 UNITS TABLET    VITAMIN D, 1000UNIT (Oral Tablet)  1 Every Day for 0 days  Quantity: 0.00;  Refills: 0   Ordered :01-May-2010  [name] ;  Started 13-Apr-2009 Active   DOXYCYCLINE (VIBRA-TABS) 100 MG TABLET    Take 1 tablet (100 mg total) by mouth 2 (two) times daily. Ov if not improved. Thanks.   FEXOFENADINE (ALLEGRA ALLERGY) 180 MG TABLET    Take by mouth.   MONTELUKAST (SINGULAIR) 10 MG TABLET    Take 1 tablet (10 mg total) by mouth daily.   MULTIPLE VITAMINS-MINERALS (ICAPS PO)    Take 1 tablet by mouth 2  (two) times daily.    SENNOSIDES (SENNO PO)    Take 1 tablet by mouth 2 (two) times daily.     Patient Care Team: Margarita Rana, MD as PCP - General (Family Medicine)     Objective:   Vitals: BP 110/68 mmHg  Pulse 72  Temp(Src) 98.5 F (36.9 C)  Resp 16  Wt 122 lb (55.339 kg)  SpO2 97%  Physical Exam  Constitutional: She is oriented to person, place, and time. She appears well-developed and well-nourished.  HENT:  Head: Normocephalic and atraumatic.  Right Ear: External ear normal.  Left Ear: External ear normal.  Nose: Nose normal.  Mouth/Throat: Oropharynx is clear and moist.  Eyes: Conjunctivae and EOM are normal. Pupils are equal, round, and reactive to light.  Neck: Normal range of motion. Neck supple.  Cardiovascular: Normal rate, regular rhythm, normal heart sounds and intact distal pulses.   Pulmonary/Chest: Effort normal.  Abdominal: Soft. Bowel sounds are normal.  Genitourinary:  Not examined today. Colonoscopy was normal in 2014.   Musculoskeletal: Normal range of motion.  Neurological: She is alert and oriented to person, place, and time. She has normal reflexes.  Skin: Skin is warm and dry.  Psychiatric: She has a normal mood and affect. Her behavior is normal. Judgment and thought content normal.  Nursing note and vitals reviewed.   Activities of Daily Living In your present state of health, do you have any difficulty performing the following activities: 05/04/2015  Hearing? Y  Vision? N  Difficulty concentrating or making decisions? N  Walking or climbing stairs? N  Dressing or bathing? N  Doing errands, shopping? N    Fall Risk Assessment Fall Risk  05/04/2015  Falls in the past year? No     Depression Screen PHQ 2/9 Scores 05/04/2015  PHQ - 2 Score 0    Cognitive Testing - 6-CIT  Correct? Score   What year is it? yes 0 0 or 4  What month is it? yes 0 0 or 3  Memorize:    Pia Mau,  44,  [place],      What time is it?  (within 1 hour) yes 0 0 or 3  Count backwards from 20 yes 0 0, 2, or 4  Name the months of the year yes 0 0, 2, or 4  Repeat name & address above no 4 0, 2, 4, 6, 8, or 10       TOTAL SCORE  4/28   Interpretation:  Normal  Normal (0-7) Abnormal (8-28)       Assessment & Plan:     Annual Wellness Visit  Patient will check and see if she has had her Pneumovax at the Urgent Care in [place].  Reviewed patient's Family Medical History Reviewed and updated list of patient's medical providers Assessment of cognitive impairment was done Assessed patient's functional ability Established a written schedule for health screening [place] Completed and Reviewed  Exercise Activities and Dietary recommendations Goals    None      Immunization History  Administered Date(s) Administered  . Influenza, High Dose Seasonal PF 03/02/2015  . Pneumococcal Conjugate-13 04/15/2014  . Tdap 02/09/2011      Discussed health benefits of physical activity, and encouraged her to engage in regular exercise appropriate for her age and condition.   1. Medicare annual wellness visit, subsequent Stable as above.  Patient was seen and examined by Jerrell Belfast, MD, and scribed by Wilburt Finlay, [place].  I have reviewed the document for accuracy and completeness and I agree with above. Jerrell Belfast, MD   Margarita Rana, MD       ------------------------------------------------------------------------------------------------------------

## 2015-05-30 DIAGNOSIS — N644 Mastodynia: Secondary | ICD-10-CM | POA: Diagnosis not present

## 2015-06-01 ENCOUNTER — Other Ambulatory Visit: Payer: Self-pay | Admitting: Unknown Physician Specialty

## 2015-06-01 DIAGNOSIS — N644 Mastodynia: Secondary | ICD-10-CM

## 2015-06-06 ENCOUNTER — Ambulatory Visit
Admission: RE | Admit: 2015-06-06 | Discharge: 2015-06-06 | Disposition: A | Discharge status: Home / Self Care | Payer: Medicare Other | Source: Ambulatory Visit | Attending: Unknown Physician Specialty | Admitting: Unknown Physician Specialty

## 2015-06-06 DIAGNOSIS — N644 Mastodynia: Secondary | ICD-10-CM

## 2015-06-12 ENCOUNTER — Ambulatory Visit (INDEPENDENT_AMBULATORY_CARE_PROVIDER_SITE_OTHER): Payer: Medicare Other | Admitting: Cardiology

## 2015-06-12 ENCOUNTER — Encounter: Payer: Self-pay | Admitting: Cardiology

## 2015-06-12 ENCOUNTER — Other Ambulatory Visit (INDEPENDENT_AMBULATORY_CARE_PROVIDER_SITE_OTHER): Payer: Medicare Other

## 2015-06-12 VITALS — BP 114/80 | HR 74 | Ht 65.0 in | Wt 119.6 lb

## 2015-06-12 DIAGNOSIS — E7800 Pure hypercholesterolemia, unspecified: Secondary | ICD-10-CM

## 2015-06-12 DIAGNOSIS — R059 Cough, unspecified: Secondary | ICD-10-CM | POA: Insufficient documentation

## 2015-06-12 DIAGNOSIS — M1610 Unilateral primary osteoarthritis, unspecified hip: Secondary | ICD-10-CM

## 2015-06-12 DIAGNOSIS — R05 Cough: Secondary | ICD-10-CM

## 2015-06-12 LAB — BASIC METABOLIC PANEL
BUN: 15 mg/dL (ref 7–25)
CALCIUM: 9.2 mg/dL (ref 8.6–10.4)
CO2: 28 mmol/L (ref 20–31)
Chloride: 103 mmol/L (ref 98–110)
Creat: 0.7 mg/dL (ref 0.60–0.93)
GLUCOSE: 81 mg/dL (ref 65–99)
POTASSIUM: 4.7 mmol/L (ref 3.5–5.3)
SODIUM: 139 mmol/L (ref 135–146)

## 2015-06-12 LAB — LIPID PANEL
CHOL/HDL RATIO: 2 ratio (ref <=5.0)
CHOLESTEROL: 143 mg/dL (ref 125–200)
HDL: 71 mg/dL (ref >=46)
LDL Cholesterol: 59 mg/dL (ref <130)
TRIGLYCERIDES: 66 mg/dL (ref <150)
VLDL: 13 mg/dL (ref <30)

## 2015-06-12 LAB — HEPATIC FUNCTION PANEL
ALBUMIN: 4 g/dL (ref 3.6–5.1)
ALT: 14 U/L (ref 6–29)
AST: 24 U/L (ref 10–35)
Alkaline Phosphatase: 47 U/L (ref 33–130)
BILIRUBIN DIRECT: 0.1 mg/dL (ref <=0.2)
Indirect Bilirubin: 0.6 mg/dL (ref 0.2–1.2)
TOTAL PROTEIN: 6.4 g/dL (ref 6.1–8.1)
Total Bilirubin: 0.7 mg/dL (ref 0.2–1.2)

## 2015-06-12 MED ORDER — AZITHROMYCIN 250 MG PO TABS: ORAL_TABLET | ORAL | Status: DC

## 2015-06-12 NOTE — Progress Notes (Signed)
Cardiology Office Note   Date:  06/12/2015   ID:  Darlene Serrano, DOB 1935-04-12, MRN [id number]  PCP:  Margarita Rana, MD  Cardiologist: Darlin Coco MD  Chief Complaint  Patient presents with  . Follow-up    Patient denies any chest pain, shortness of breath, and le edema      History of Present Illness: Darlene Serrano is a 81 y.o. female who presents for a six-month follow-up visit.   She has a history of hypercholesterolemia and she has a history of mild asthma. She has a remote history of palpitations. She has osteoarthritis of the right hip and underwent successful total hip replacement on 06/30/13 by Dr.Aluisio. Since last visit she's had no new cardiac symptoms. She has been experiencing some malaise and fatigue.  She has not been having any chest pain or shortness of breath. No dizziness or syncope. Since Christmas she has had a head cold which is now going down into her chest.  She has been coughing up some green sputum.  She has been on Mucinex D but no antibiotic. She has not been having any dizziness.  No palpitations.  Past Medical History  Diagnosis Date  . Pure hypercholesterolemia   . Unspecified vitamin D deficiency   . Macular degeneration of both eyes     HX OF INJECTIONS INTO LEFT EYE ( ? AVASTIN ) FROM OCT 2012 TO OCT 2014.  . Cancer ([place])     LOCAL EXCISION SKIN CA FROM NOSE  06/11/13  . History of palpitations     YEARS AGO  . Impaired hearing   . Osteoporosis   . Arthritis     RIGHT HIP AND HANDS;  DDD  . Asthma 08/30/2010    Past Surgical History  Procedure Laterality Date  . Mohs surgery forehead for basal cell ca  2014  . Back surgery  1993    LUMBAR DISCECTOMY  . 1996 left knee arthroscopy    . 1987 tmj surgery    . Abdominal hysterectomy  1971  . Appendectomy  1964  . Total hip arthroplasty Right 06/30/2013    Procedure: RIGHT TOTAL HIP ARTHROPLASTY ANTERIOR APPROACH;  Surgeon: Gearlean Alf, MD;  Location: [facility];  Service:  Orthopedics;  Laterality: Right;     Current Outpatient Prescriptions  Medication Sig Dispense Refill  . acetaminophen (TYLENOL) 325 MG tablet Take 2 tablets (650 mg total) by mouth every 6 (six) hours as needed for mild pain (or Fever >/= 101). 80 tablet 0  . ascorbic acid (VITAMIN C) 500 MG tablet Take 500 mg by mouth daily.     Marland Kitchen atorvastatin (LIPITOR) 10 MG tablet Take 10 mg by mouth every morning.    . calcium carbonate (OS-CAL) 600 MG TABS tablet Take 600 mg by mouth daily.     . cholecalciferol (VITAMIN D) 1000 UNITS tablet VITAMIN D, 1000UNIT (Oral Tablet)  1 Every Day for 0 days  Quantity: 0.00;  Refills: 0   Ordered :01-May-2010  [name] ;  Started 13-Apr-2009 Active    . fexofenadine (ALLEGRA ALLERGY) 180 MG tablet Take 180 mg by mouth daily as needed for allergies.     . montelukast (SINGULAIR) 10 MG tablet Take 1 tablet (10 mg total) by mouth daily. 30 tablet 0  . Multiple Vitamins-Minerals (ICAPS PO) Take 1 tablet by mouth 2 (two) times daily.     . Sennosides (SENNO PO) Take 1 tablet by mouth 2 (two) times daily.  No current facility-administered medications for this visit.    Allergies:   Penicillins and Codeine    Social History:  The patient  reports that she has never smoked. She has never used smokeless tobacco. She reports that she does not drink alcohol or use illicit drugs.   Family History:  The patient's family history includes AAA (abdominal aortic aneurysm) in her father; Cancer in her father; Congestive Heart Failure in her mother.    ROS:  Please see the history of present illness.   Otherwise, review of systems are positive for none.   All other systems are reviewed and negative.    PHYSICAL EXAM: VS:  BP 114/80 mmHg  Pulse 74  Ht 5\' 5"  (1.651 m)  Wt 119 lb 9.6 oz (54.25 kg)  BMI 19.90 kg/m2 , BMI Body mass index is 19.9 kg/(m^2). GEN: Well nourished, well developed, in no acute distress HEENT: normal Neck: no JVD, carotid bruits, or  masses Cardiac: RRR; no murmurs, rubs, or gallops,no edema  Respiratory:  clear to auscultation bilaterally, normal work of breathing.  Scattered rhonchi bilaterally GI: soft, nontender, nondistended, + BS MS: no deformity or atrophy Skin: warm and dry, no rash Neuro:  Strength and sensation are intact Psych: euthymic mood, full affect   EKG:  EKG is not ordered today.    Recent Labs: 10/31/2014: ALT 13; BUN 16; Creatinine, Ser 0.72; Potassium 4.6; Sodium 138    Lipid Panel    Component Value Date/Time   CHOL 166 10/31/2014 0837   TRIG 62.0 10/31/2014 0837   HDL 65.20 10/31/2014 0837   CHOLHDL 3 10/31/2014 0837   VLDL 12.4 10/31/2014 0837   LDLCALC 88 10/31/2014 0837      Wt Readings from Last 3 Encounters:  06/12/15 119 lb 9.6 oz (54.25 kg)  05/04/15 122 lb (55.339 kg)  01/25/15 123 lb (55.792 kg)         ASSESSMENT AND PLAN:  1. Hypercholesterolemia 2. osteoarthritis status post successful right hip replacement February 2015 3.  Cough secondary to bronchitis  Current medicines are reviewed at length with the patient today.  The patient does not have concerns regarding medicines.  The following changes have been made:  We will give her a Z-Pak and have her take plain Mucinex for her cough  Labs/ tests ordered today include:  No orders of the defined types were placed in this encounter.    Disposition: Continue current medication.  With my retirement, the patient will continue to follow-up with her primary care physician and we will see him back here in cardiology on a when necessary basis.  From a cardiac standpoint she has been stable.  Berna Spare MD 06/12/2015 8:16 AM    [facility] [address], [place], [place]  [address] Phone: [phone]; Fax: [phone]

## 2015-06-12 NOTE — Patient Instructions (Signed)
Medication Instructions:  RX FOR ZPAK 2 TABLETS ON DAY 1 AND THEN 1 DAILY UNTIL FINISHED HAS BEEN SENT TO YOUR PHARMACY  TRY MUCINEX PLAIN EVERY 12 HOURS AS NEEDED FOR COUGH/CONGESTION  Labwork: LP/BMET/HFP  Testing/Procedures: NONE  Follow-Up: AS NEEDED   If you need a refill on your cardiac medications before your next appointment, please call your pharmacy.

## 2015-06-13 NOTE — Progress Notes (Signed)
Quick Note:  Please report to patient. The recent labs are stable. Continue same medication and careful diet. ______ 

## 2015-06-28 ENCOUNTER — Other Ambulatory Visit: Payer: Self-pay | Admitting: Family Medicine

## 2015-06-28 DIAGNOSIS — J309 Allergic rhinitis, unspecified: Secondary | ICD-10-CM

## 2015-07-10 ENCOUNTER — Encounter: Payer: Self-pay | Admitting: Family Medicine

## 2015-07-10 ENCOUNTER — Ambulatory Visit (INDEPENDENT_AMBULATORY_CARE_PROVIDER_SITE_OTHER): Payer: Medicare Other | Admitting: Family Medicine

## 2015-07-10 VITALS — BP 118/64 | HR 68 | Temp 97.8°F | Resp 14 | Wt 120.4 lb

## 2015-07-10 DIAGNOSIS — R52 Pain, unspecified: Secondary | ICD-10-CM

## 2015-07-10 DIAGNOSIS — J029 Acute pharyngitis, unspecified: Secondary | ICD-10-CM

## 2015-07-10 LAB — POC INFLUENZA A&B (BINAX/QUICKVUE)
Influenza A, POC: NEGATIVE
Influenza B, POC: NEGATIVE

## 2015-07-10 LAB — POCT RAPID STREP A (OFFICE): Rapid Strep A Screen: NEGATIVE

## 2015-07-10 NOTE — Progress Notes (Signed)
Patient ID: Darlene Serrano, female   DOB: 09-03-1934, 81 y.o.   MRN: [id number]   Patient: Darlene Serrano Female    DOB: 21-Oct-1934   81 y.o.   MRN: [id number] Visit Date: 07/10/2015  Today's Provider: Vernie Murders, PA   Chief Complaint  Patient presents with  . Sore Throat   Subjective:    Sore Throat  This is a new problem. The current episode started yesterday. The problem has been unchanged. The pain is worse on the right side. Associated symptoms include congestion and diarrhea. Associated symptoms comments: Body aches and some yellow sputum this morning. Sorethroat started last night. Grandson diagnosed with influenza approximately 10-14 days ago and was treated with Tamiflu. .   Past Medical History  Diagnosis Date  . Pure hypercholesterolemia   . Unspecified vitamin D deficiency   . Macular degeneration of both eyes     HX OF INJECTIONS INTO LEFT EYE ( ? AVASTIN ) FROM OCT 2012 TO OCT 2014.  . Cancer ([place])     LOCAL EXCISION SKIN CA FROM NOSE  06/11/13  . History of palpitations     YEARS AGO  . Impaired hearing   . Osteoporosis   . Arthritis     RIGHT HIP AND HANDS;  DDD  . Asthma 08/30/2010   Past Surgical History  Procedure Laterality Date  . Mohs surgery forehead for basal cell ca  2014  . Back surgery  1993    LUMBAR DISCECTOMY  . 1996 left knee arthroscopy    . 1987 tmj surgery    . Abdominal hysterectomy  1971  . Appendectomy  1964  . Total hip arthroplasty Right 06/30/2013    Procedure: RIGHT TOTAL HIP ARTHROPLASTY ANTERIOR APPROACH;  Surgeon: Gearlean Alf, MD;  Location: [facility];  Service: Orthopedics;  Laterality: Right;   Family History  Problem Relation Age of Onset  . Congestive Heart Failure Mother   . AAA (abdominal aortic aneurysm) Father   . Cancer Father      Previous Medications   ACETAMINOPHEN (TYLENOL) 325 MG TABLET    Take 2 tablets (650 mg total) by mouth every 6 (six) hours as needed for mild pain (or Fever >/= 101).   ASCORBIC  ACID (VITAMIN C) 500 MG TABLET    Take 500 mg by mouth daily.    ATORVASTATIN (LIPITOR) 10 MG TABLET    Take 10 mg by mouth every morning.   CALCIUM CARBONATE (OS-CAL) 600 MG TABS TABLET    Take 600 mg by mouth daily.    CHOLECALCIFEROL (VITAMIN D) 1000 UNITS TABLET    VITAMIN D, 1000UNIT (Oral Tablet)  1 Every Day for 0 days  Quantity: 0.00;  Refills: 0   Ordered :01-May-2010  [name] ;  Started 13-Apr-2009 Active   FEXOFENADINE [facility] ALLERGY) 180 MG TABLET    Take 180 mg by mouth daily as needed for allergies.    GUAIFENESIN (MUCINEX) 600 MG 12 HR TABLET    Take by mouth 2 (two) times daily as needed for cough or to loosen phlegm.   MONTELUKAST (SINGULAIR) 10 MG TABLET    TAKE 1 TABLET BY MOUTH EVERY DAY   MULTIPLE VITAMINS-MINERALS (ICAPS PO)    Take 1 tablet by mouth 2 (two) times daily.    SENNOSIDES (SENNO PO)    Take 1 tablet by mouth 2 (two) times daily.    Allergies  Allergen Reactions  . Penicillins Swelling  . Codeine Itching    Review of  Systems  Constitutional: Negative.   HENT: Positive for congestion and sore throat.   Eyes: Negative.   Respiratory: Negative.   Cardiovascular: Negative.   Gastrointestinal: Positive for diarrhea.  Endocrine: Negative.   Genitourinary: Negative.   Musculoskeletal: Negative.   Skin: Negative.   Allergic/Immunologic: Negative.   Neurological: Negative.   Hematological: Negative.   Psychiatric/Behavioral: Negative.     Social History  Substance Use Topics  . Smoking status: Never Smoker   . Smokeless tobacco: Never Used  . Alcohol Use: No   Objective:   BP 118/64 mmHg  Pulse 68  Temp(Src) 97.8 F (36.6 C) (Oral)  Resp 14  Wt 120 lb 6.4 oz (54.613 kg)  Physical Exam  Constitutional: She is oriented to person, place, and time. She appears well-developed and well-nourished. No distress.  HENT:  Head: Normocephalic and atraumatic.  Right Ear: Hearing normal.  Left Ear: Hearing normal.  Nose: Nose normal.  Wears  hearing aids bilaterally. Clear rhinorrhea with reddened nasal mucosa.   Eyes: Conjunctivae, EOM and lids are normal. Right eye exhibits no discharge. Left eye exhibits no discharge. No scleral icterus.  Neck: Normal range of motion. Neck supple.  Cardiovascular: Normal rate and regular rhythm.   Pulmonary/Chest: Effort normal and breath sounds normal. No respiratory distress.  Abdominal: Soft. Bowel sounds are normal.  Musculoskeletal:  Arthritic deformities of hands (mild to moderate).  Lymphadenopathy:    She has no cervical adenopathy.  Neurological: She is alert and oriented to person, place, and time.  Skin: Skin is intact. No lesion and no rash noted.  Psychiatric: She has a normal mood and affect. Her speech is normal and behavior is normal. Thought content normal.      Assessment & Plan:     1. Sore throat Onset over the past 12 hours. Negative strep test. May use Allegra and Mucinex prn with saltwater gargles. May use Tylenol prn pain or body aches. Increase fluid intake and recheck if no better in 5-7 days. - POC Influenza A&B - POCT rapid strep A  2. Body aches Influenza test negative. May use Tylenol prn and recheck if no better in 5-7 days. Suspect viral syndrome/URI. - POC Influenza A&B - POCT rapid strep A

## 2015-07-10 NOTE — Patient Instructions (Signed)

## 2015-07-13 ENCOUNTER — Telehealth: Payer: Self-pay | Admitting: Family Medicine

## 2015-07-13 DIAGNOSIS — L821 Other seborrheic keratosis: Secondary | ICD-10-CM | POA: Diagnosis not present

## 2015-07-13 DIAGNOSIS — L814 Other melanin hyperpigmentation: Secondary | ICD-10-CM | POA: Diagnosis not present

## 2015-07-13 DIAGNOSIS — L820 Inflamed seborrheic keratosis: Secondary | ICD-10-CM | POA: Diagnosis not present

## 2015-07-13 DIAGNOSIS — Z85828 Personal history of other malignant neoplasm of skin: Secondary | ICD-10-CM | POA: Diagnosis not present

## 2015-07-13 DIAGNOSIS — J018 Other acute sinusitis: Secondary | ICD-10-CM

## 2015-07-13 MED ORDER — AZITHROMYCIN 250 MG PO TABS: ORAL_TABLET | ORAL | Status: DC

## 2015-07-13 NOTE — Telephone Encounter (Signed)
Please review-aa 

## 2015-07-13 NOTE — Telephone Encounter (Signed)
Please what antibiotic has helped her in the past. Thanks.

## 2015-07-13 NOTE — Telephone Encounter (Signed)
Pt called back after being in Monday saying she is still feeling really bad.  Coughing and congestion.  She wants to know if you would call her in an antibiotic/  [facility] uses [facility]  Her call back is   [phone]  Thanks Con Memos

## 2015-09-04 DIAGNOSIS — H353114 Nonexudative age-related macular degeneration, right eye, advanced atrophic with subfoveal involvement: Secondary | ICD-10-CM | POA: Diagnosis not present

## 2015-09-29 DIAGNOSIS — H6123 Impacted cerumen, bilateral: Secondary | ICD-10-CM | POA: Diagnosis not present

## 2015-09-29 DIAGNOSIS — H903 Sensorineural hearing loss, bilateral: Secondary | ICD-10-CM | POA: Diagnosis not present

## 2015-11-06 ENCOUNTER — Ambulatory Visit (INDEPENDENT_AMBULATORY_CARE_PROVIDER_SITE_OTHER): Payer: Medicare Other | Admitting: Family Medicine

## 2015-11-06 ENCOUNTER — Encounter: Payer: Self-pay | Admitting: Family Medicine

## 2015-11-06 VITALS — BP 126/60 | HR 86 | Temp 97.7°F | Resp 16 | Wt 124.0 lb

## 2015-11-06 DIAGNOSIS — R531 Weakness: Secondary | ICD-10-CM | POA: Diagnosis not present

## 2015-11-06 DIAGNOSIS — R002 Palpitations: Secondary | ICD-10-CM | POA: Diagnosis not present

## 2015-11-06 DIAGNOSIS — F410 Panic disorder [episodic paroxysmal anxiety] without agoraphobia: Secondary | ICD-10-CM | POA: Diagnosis not present

## 2015-11-06 NOTE — Progress Notes (Signed)
Patient ID: Darlene Serrano, female   DOB: Apr 27, 1935, 81 y.o.   MRN: [id number]    Subjective:  HPI  Pt comes in today for a episode that she has had twice, now. She reports that the first one was on June 3rd and seem to have been worse than the one she recently had over the weekend. She reports that she felt like she was shaking inside and felt weak. She laid down and after about 30 minutes she got up and ate and felt better. She denies any chest pain or shortness of breath at the time. She does reports that afterwards her left arm was hurting and she felt like her heart was racing. Denied any numbness of tingling. She reports that she felt almost like she was about to pass out. She reports that she has been having a " hacky" cough and a tightness in her upper chest and neck.  No orthopnea or PND. No exertional chest pain. Prior to Admission medications   Medication Sig Start Date End Date Taking? Authorizing Provider  acetaminophen (TYLENOL) 325 MG tablet Take 2 tablets (650 mg total) by mouth every 6 (six) hours as needed for mild pain (or Fever >/= 101). 07/02/13  Yes Arlee Muslim, PA-C  ascorbic acid (VITAMIN C) 500 MG tablet Take 500 mg by mouth daily.    Yes Historical Provider, MD  atorvastatin (LIPITOR) 10 MG tablet Take 10 mg by mouth every morning.   Yes Historical Provider, MD  calcium carbonate (OS-CAL) 600 MG TABS tablet Take 600 mg by mouth daily.  04/13/09  Yes Historical Provider, MD  cholecalciferol (VITAMIN D) 1000 UNITS tablet VITAMIN D, 1000UNIT (Oral Tablet)  1 Every Day for 0 days  Quantity: 0.00;  Refills: 0   Ordered :01-May-2010  [name] ;  Started 13-Apr-2009 Active 04/13/09  Yes Historical Provider, MD  fexofenadine (ALLEGRA ALLERGY) 180 MG tablet Take 180 mg by mouth daily as needed for allergies.  11/11/13  Yes Historical Provider, MD  montelukast (SINGULAIR) 10 MG tablet TAKE 1 TABLET BY MOUTH EVERY DAY 06/28/15  Yes Margarita Rana, MD  Multiple Vitamins-Minerals  (ICAPS PO) Take 1 tablet by mouth 2 (two) times daily.    Yes Historical Provider, MD  Sennosides (SENNO PO) Take 1 tablet by mouth 2 (two) times daily.    Yes Historical Provider, MD  azithromycin (ZITHROMAX) 250 MG tablet Take 2 tablets on day one and then 1 tablet daily Patient not taking: Reported on 11/06/2015 07/13/15   Margarita Rana, MD  guaiFENesin (MUCINEX) 600 MG 12 hr tablet Take by mouth 2 (two) times daily as needed for cough or to loosen phlegm.    Historical Provider, MD    Patient Active Problem List   Diagnosis Date Noted  . Cough 06/12/2015  . CN (constipation) 01/25/2015  . Sinusitis, acute 01/25/2015  . Malaise and fatigue 10/29/2013  . Allergic rhinitis 07/08/2013  . Status post right hip replacement 07/06/2013  . Postoperative anemia due to acute blood loss 07/01/2013  . OA (osteoarthritis) of hip 06/29/2013  . Osteoarthritis (arthritis due to wear and tear of joints) 04/13/2013  . Rapid palpitations 09/30/2012  . Hypercholesterolemia 08/30/2010  . Asthma 08/30/2010  . Macular degeneration 08/30/2010  . Acid reflux 04/13/2009  . Hypercholesteremia 04/13/2009  . Auditory vertigo 04/13/2009  . Arthritis, degenerative 04/13/2009    Past Medical History  Diagnosis Date  . Pure hypercholesterolemia   . Unspecified vitamin D deficiency   . Macular degeneration of both  eyes     HX OF INJECTIONS INTO LEFT EYE ( ? AVASTIN ) FROM OCT 2012 TO OCT 2014.  . Cancer ([place])     LOCAL EXCISION SKIN CA FROM NOSE  06/11/13  . History of palpitations     YEARS AGO  . Impaired hearing   . Osteoporosis   . Arthritis     RIGHT HIP AND HANDS;  DDD  . Asthma 08/30/2010    Social History   Social History  . Marital Status: Divorced    Spouse Name: N/A  . Number of Children: N/A  . Years of Education: N/A   Occupational History  . Not on file.   Social History Main Topics  . Smoking status: Never Smoker   . Smokeless tobacco: Never Used  . Alcohol Use: No  . Drug Use:  No  . Sexual Activity: Not on file   Other Topics Concern  . Not on file   Social History Narrative    Allergies  Allergen Reactions  . Penicillins Swelling  . Codeine Itching    Review of Systems  Constitutional: Positive for malaise/fatigue.  HENT: Negative.   Eyes: Negative.   Respiratory: Positive for cough.   Cardiovascular: Negative.   Gastrointestinal: Negative.   Genitourinary: Negative.   Musculoskeletal: Negative.   Skin: Negative.   Neurological: Positive for tremors and weakness.  Endo/Heme/Allergies: Negative.   Psychiatric/Behavioral: Negative.     Immunization History  Administered Date(s) Administered  . Influenza, High Dose Seasonal PF 03/02/2015  . Pneumococcal Conjugate-13 04/15/2014  . Tdap 02/09/2011   Objective:  BP 126/60 mmHg  Pulse 86  Temp(Src) 97.7 F (36.5 C) (Oral)  Resp 16  Wt 124 lb (56.246 kg)  SpO2 96%  Physical Exam  Constitutional: She is oriented to person, place, and time and well-developed, well-nourished, and in no distress.  HENT:  Head: Normocephalic and atraumatic.  Right Ear: External ear normal.  Left Ear: External ear normal.  Eyes: Conjunctivae and EOM are normal. Pupils are equal, round, and reactive to light.  Neck: Normal range of motion. Neck supple.  Cardiovascular: Normal rate, regular rhythm, normal heart sounds and intact distal pulses.   Pulmonary/Chest: Effort normal and breath sounds normal.  Abdominal: Soft. Bowel sounds are normal.  Musculoskeletal: Normal range of motion.  Neurological: She is alert and oriented to person, place, and time. No cranial nerve deficit. She exhibits normal muscle tone. Gait normal. Coordination normal. GCS score is 15.  Skin: Skin is warm and dry.  Psychiatric: Mood, memory, affect and judgment normal.    Lab Results  Component Value Date   WBC 4.8 11/03/2013   HGB 12.0 11/03/2013   HCT 36.6 11/03/2013   PLT 195.0 11/03/2013   GLUCOSE 81 06/12/2015   CHOL 143  06/12/2015   TRIG 66 06/12/2015   HDL 71 06/12/2015   LDLCALC 59 06/12/2015   TSH 1.82 11/03/2013   INR 0.95 06/23/2013    CMP     Component Value Date/Time   NA 139 06/12/2015 0823   K 4.7 06/12/2015 0823   CL 103 06/12/2015 0823   CO2 28 06/12/2015 0823   GLUCOSE 81 06/12/2015 0823   BUN 15 06/12/2015 0823   CREATININE 0.70 06/12/2015 0823   CREATININE 0.72 10/31/2014 0837   CALCIUM 9.2 06/12/2015 0823   PROT 6.4 06/12/2015 0823   ALBUMIN 4.0 06/12/2015 0823   AST 24 06/12/2015 0823   ALT 14 06/12/2015 0823   ALKPHOS 47 06/12/2015 [id number]  BILITOT 0.7 06/12/2015 0823   GFRNONAA 87* 07/02/2013 0415   GFRAA >90 07/02/2013 0415    Assessment and Plan :  1. Weakness I do not think that she needs a neurology evaluation of this time. Follow clinically. - EKG 12-Lead - Ambulatory referral to Cardiology - CBC with Differential/Platelet - Comprehensive metabolic panel  2. Palpitations Pt advised to take ASA 81 mg daily. - EKG 12-Lead - Ambulatory referral to Cardiology - TSH  3. Anxiety attack  Will follow for now. May need treatment in the future. Patient has not let her daughters know yet that she is having this workup.  Patient was seen and examined by Dr. Miguel Aschoff, and noted scribed by Webb Laws, [place] MD [facility] 11/06/2015 3:15 PM

## 2015-11-06 NOTE — Patient Instructions (Signed)
Take an 81 mg Aspirin daily

## 2015-11-07 LAB — CBC WITH DIFFERENTIAL/PLATELET
BASOS: 1 %
Basophils Absolute: 0 10*3/uL (ref 0.0–0.2)
EOS (ABSOLUTE): 0.3 10*3/uL (ref 0.0–0.4)
EOS: 5 %
HEMOGLOBIN: 12.9 g/dL (ref 11.1–15.9)
Hematocrit: 39.1 % (ref 34.0–46.6)
IMMATURE GRANULOCYTES: 0 %
Immature Grans (Abs): 0 10*3/uL (ref 0.0–0.1)
LYMPHS: 35 %
Lymphocytes Absolute: 2.2 10*3/uL (ref 0.7–3.1)
MCH: 30.9 pg (ref 26.6–33.0)
MCHC: 33 g/dL (ref 31.5–35.7)
MCV: 94 fL (ref 79–97)
Monocytes Absolute: 0.4 10*3/uL (ref 0.1–0.9)
Monocytes: 7 %
Neutrophils Absolute: 3.4 10*3/uL (ref 1.4–7.0)
Neutrophils: 52 %
PLATELETS: 227 10*3/uL (ref 150–379)
RBC: 4.18 x10E6/uL (ref 3.77–5.28)
RDW: 13.4 % (ref 12.3–15.4)
WBC: 6.4 10*3/uL (ref 3.4–10.8)

## 2015-11-07 LAB — COMPREHENSIVE METABOLIC PANEL
ALT: 12 IU/L (ref 0–32)
AST: 21 IU/L (ref 0–40)
Albumin/Globulin Ratio: 2.2 (ref 1.2–2.2)
Albumin: 4.4 g/dL (ref 3.5–4.8)
Alkaline Phosphatase: 66 IU/L (ref 39–117)
BUN/Creatinine Ratio: 27 (ref 12–28)
BUN: 20 mg/dL (ref 8–27)
Bilirubin Total: 0.4 mg/dL (ref 0.0–1.2)
CALCIUM: 9.4 mg/dL (ref 8.7–10.3)
CO2: 25 mmol/L (ref 18–29)
CREATININE: 0.74 mg/dL (ref 0.57–1.00)
Chloride: 103 mmol/L (ref 96–106)
GFR calc non Af Amer: 78 mL/min/{1.73_m2} (ref >59)
GFR, EST AFRICAN AMERICAN: 90 mL/min/{1.73_m2} (ref >59)
GLOBULIN, TOTAL: 2 g/dL (ref 1.5–4.5)
Glucose: 92 mg/dL (ref 65–99)
Potassium: 4.5 mmol/L (ref 3.5–5.2)
Sodium: 143 mmol/L (ref 134–144)
TOTAL PROTEIN: 6.4 g/dL (ref 6.0–8.5)

## 2015-11-07 LAB — TSH: TSH: 1.73 u[IU]/mL (ref 0.450–4.500)

## 2015-11-09 DIAGNOSIS — E782 Mixed hyperlipidemia: Secondary | ICD-10-CM | POA: Diagnosis not present

## 2015-11-09 DIAGNOSIS — R002 Palpitations: Secondary | ICD-10-CM | POA: Diagnosis not present

## 2015-11-09 DIAGNOSIS — R0602 Shortness of breath: Secondary | ICD-10-CM | POA: Diagnosis not present

## 2015-11-09 DIAGNOSIS — R42 Dizziness and giddiness: Secondary | ICD-10-CM | POA: Diagnosis not present

## 2015-11-13 ENCOUNTER — Other Ambulatory Visit: Payer: Self-pay | Admitting: Cardiovascular Disease

## 2015-11-13 MED ORDER — ATORVASTATIN CALCIUM 10 MG PO TABS: 10.0000 mg | ORAL_TABLET | Freq: Every morning | ORAL | Status: DC

## 2015-11-13 NOTE — Telephone Encounter (Signed)
°*  STAT* If patient is at the pharmacy, call can be transferred to refill team.   1. Which medications need to be refilled? (please list name of each medication and dose if known) Atorvastatin 10 mg   2. Which pharmacy/location (including street and city if local pharmacy) is medication to [facility] sent to?[facility] [address] [facility] [place]   3. Do they need a 30 day or 90 day supply? [place]

## 2015-11-13 NOTE — Telephone Encounter (Signed)
Refill sent to the pharmacy electronically.  

## 2015-11-22 ENCOUNTER — Ambulatory Visit: Payer: Medicare Other | Admitting: Cardiology

## 2015-11-23 DIAGNOSIS — R002 Palpitations: Secondary | ICD-10-CM | POA: Diagnosis not present

## 2015-11-23 DIAGNOSIS — R0602 Shortness of breath: Secondary | ICD-10-CM | POA: Diagnosis not present

## 2015-12-21 ENCOUNTER — Ambulatory Visit: Payer: Medicare Other | Admitting: Cardiovascular Disease

## 2015-12-28 ENCOUNTER — Other Ambulatory Visit: Payer: Self-pay | Admitting: Family Medicine

## 2015-12-28 DIAGNOSIS — J309 Allergic rhinitis, unspecified: Secondary | ICD-10-CM

## 2016-01-03 ENCOUNTER — Other Ambulatory Visit: Payer: Self-pay | Admitting: Family Medicine

## 2016-01-03 DIAGNOSIS — J309 Allergic rhinitis, unspecified: Secondary | ICD-10-CM

## 2016-01-08 ENCOUNTER — Ambulatory Visit: Payer: Medicare Other | Admitting: Family Medicine

## 2016-01-11 ENCOUNTER — Ambulatory Visit (INDEPENDENT_AMBULATORY_CARE_PROVIDER_SITE_OTHER): Payer: Medicare Other | Admitting: Family Medicine

## 2016-01-11 VITALS — BP 118/76 | HR 72 | Resp 16 | Wt 123.0 lb

## 2016-01-11 DIAGNOSIS — E7800 Pure hypercholesterolemia, unspecified: Secondary | ICD-10-CM

## 2016-01-11 DIAGNOSIS — J309 Allergic rhinitis, unspecified: Secondary | ICD-10-CM | POA: Diagnosis not present

## 2016-01-11 DIAGNOSIS — R002 Palpitations: Secondary | ICD-10-CM | POA: Diagnosis not present

## 2016-01-11 MED ORDER — ATORVASTATIN CALCIUM 10 MG PO TABS: 10.0000 mg | ORAL_TABLET | Freq: Every morning | ORAL | 12 refills | Status: AC

## 2016-01-11 NOTE — Progress Notes (Signed)
Subjective:  HPI  Patient is here for 2 months follow up palpitations. She did see cardiologist and has felt better since last visit. She feels like maybe all of this was anxiety related.  Also would like to discuss allergies, this is controlled usually but sometimes she gets post nasal drainage and gets to coughing with green phlegm.  Prior to Admission medications   Medication Sig Start Date End Date Taking? Authorizing Provider  acetaminophen (TYLENOL) 325 MG tablet Take 2 tablets (650 mg total) by mouth every 6 (six) hours as needed for mild pain (or Fever >/= 101). Patient not taking: Reported on 11/06/2015 07/02/13   Arlee Muslim, PA-C  atorvastatin (LIPITOR) 10 MG tablet Take 1 tablet (10 mg total) by mouth every morning. 11/13/15   Skeet Latch, MD  calcium carbonate (OS-CAL) 600 MG TABS tablet Take 600 mg by mouth daily.  04/13/09   Historical Provider, MD  cholecalciferol (VITAMIN D) 1000 UNITS tablet VITAMIN D, 1000UNIT (Oral Tablet)  1 Every Day for 0 days  Quantity: 0.00;  Refills: 0   Ordered :01-May-2010  [name] ;  Started 13-Apr-2009 Active 04/13/09   Historical Provider, MD  fexofenadine Delma Freeze ALLERGY) 180 MG tablet Take 180 mg by mouth daily as needed for allergies.  11/11/13   Historical Provider, MD  guaiFENesin (MUCINEX) 600 MG 12 hr tablet Take by mouth 2 (two) times daily as needed for cough or to loosen phlegm. Reported on 11/06/2015    Historical Provider, MD  montelukast (SINGULAIR) 10 MG tablet TAKE 1 TABLET BY MOUTH EVERY DAY 12/30/15   Jerrol Banana., MD  montelukast (SINGULAIR) 10 MG tablet TAKE 1 TABLET BY MOUTH EVERY DAY 01/03/16   Jerrol Banana., MD  Multiple Vitamins-Minerals (ICAPS PO) Take 1 tablet by mouth 2 (two) times daily.     Historical Provider, MD    Patient Active Problem List   Diagnosis Date Noted  . Cough 06/12/2015  . CN (constipation) 01/25/2015  . Sinusitis, acute 01/25/2015  . Malaise and fatigue 10/29/2013  .  Allergic rhinitis 07/08/2013  . Status post right hip replacement 07/06/2013  . Postoperative anemia due to acute blood loss 07/01/2013  . OA (osteoarthritis) of hip 06/29/2013  . Osteoarthritis (arthritis due to wear and tear of joints) 04/13/2013  . Rapid palpitations 09/30/2012  . Hypercholesterolemia 08/30/2010  . Asthma 08/30/2010  . Macular degeneration 08/30/2010  . Acid reflux 04/13/2009  . Hypercholesteremia 04/13/2009  . Auditory vertigo 04/13/2009  . Arthritis, degenerative 04/13/2009    Past Medical History:  Diagnosis Date  . Arthritis    RIGHT HIP AND HANDS;  DDD  . Asthma 08/30/2010  . Cancer ([place])    LOCAL EXCISION SKIN CA FROM NOSE  06/11/13  . History of palpitations    YEARS AGO  . Impaired hearing   . Macular degeneration of both eyes    HX OF INJECTIONS INTO LEFT EYE ( ? AVASTIN ) FROM OCT 2012 TO OCT 2014.  . Osteoporosis   . Pure hypercholesterolemia   . Unspecified vitamin D deficiency     Social History   Social History  . Marital status: Divorced    Spouse name: N/A  . Number of children: N/A  . Years of education: N/A   Occupational History  . Not on file.   Social History Main Topics  . Smoking status: Never Smoker  . Smokeless tobacco: Never Used  . Alcohol use No  . Drug use: No  .  Sexual activity: Not on file   Other Topics Concern  . Not on file   Social History Narrative  . No narrative on file    Allergies  Allergen Reactions  . Penicillins Swelling  . Codeine Itching    Review of Systems  Constitutional: Negative.   Respiratory: Negative.   Cardiovascular: Negative.   Gastrointestinal: Negative.   Musculoskeletal: Negative.   Skin: Negative.   Neurological: Negative.   Endo/Heme/Allergies: Negative.   Psychiatric/Behavioral: Negative.     Immunization History  Administered Date(s) Administered  . Influenza, High Dose Seasonal PF 03/02/2015  . Pneumococcal Conjugate-13 04/15/2014  . Tdap 02/09/2011    Objective:  BP 118/76   Pulse 72   Resp 16   Wt 123 lb (55.8 kg)   BMI 20.47 kg/m   Physical Exam  Constitutional: She is oriented to person, place, and time and well-developed, well-nourished, and in no distress.  HENT:  Head: Normocephalic and atraumatic.  Right Ear: External ear normal.  Left Ear: External ear normal.  Nose: Nose normal.  Eyes: Conjunctivae are normal. Pupils are equal, round, and reactive to light.  Neck: Normal range of motion. Neck supple.  Cardiovascular: Normal rate, regular rhythm, normal heart sounds and intact distal pulses.   No murmur heard. Pulmonary/Chest: Effort normal and breath sounds normal. No respiratory distress. She has no wheezes.  Musculoskeletal: Normal range of motion. She exhibits no edema.  Neurological: She is alert and oriented to person, place, and time.  Skin: Skin is warm and dry.  Psychiatric: Mood, memory, affect and judgment normal.    Lab Results  Component Value Date   WBC 6.4 11/06/2015   HGB 12.0 11/03/2013   HCT 39.1 11/06/2015   PLT 227 11/06/2015   GLUCOSE 92 11/06/2015   CHOL 143 06/12/2015   TRIG 66 06/12/2015   HDL 71 06/12/2015   LDLCALC 59 06/12/2015   TSH 1.730 11/06/2015   INR 0.95 06/23/2013    CMP     Component Value Date/Time   NA 143 11/06/2015 1533   K 4.5 11/06/2015 1533   CL 103 11/06/2015 1533   CO2 25 11/06/2015 1533   GLUCOSE 92 11/06/2015 1533   GLUCOSE 81 06/12/2015 0823   BUN 20 11/06/2015 1533   CREATININE 0.74 11/06/2015 1533   CREATININE 0.70 06/12/2015 0823   CALCIUM 9.4 11/06/2015 1533   PROT 6.4 11/06/2015 1533   ALBUMIN 4.4 11/06/2015 1533   AST 21 11/06/2015 1533   ALT 12 11/06/2015 1533   ALKPHOS 66 11/06/2015 1533   BILITOT 0.4 11/06/2015 1533   GFRNONAA 78 11/06/2015 1533   GFRAA 90 11/06/2015 1533    Assessment and Plan :  1. Palpitations Resolved now. Patient did see cardiologist. Follow as needed.  2. Allergic rhinitis, unspecified allergic rhinitis  type Stable. Discussed recurrent cough and what could be the etiology for it and what patient can do. Discussed use of inhaler but patient does not want to use that at this time.   3. Hypercholesterolemia Stable. Refill provided.  Patient was seen and examined by Dr. Eulas Post and note was scribed by Theressa Millard, RMA.    Miguel Aschoff MD [facility] 01/11/2016 11:36 AM

## 2016-02-13 DIAGNOSIS — I340 Nonrheumatic mitral (valve) insufficiency: Secondary | ICD-10-CM | POA: Insufficient documentation

## 2016-03-09 ENCOUNTER — Ambulatory Visit (INDEPENDENT_AMBULATORY_CARE_PROVIDER_SITE_OTHER): Payer: Medicare Other

## 2016-03-09 DIAGNOSIS — Z23 Encounter for immunization: Secondary | ICD-10-CM | POA: Diagnosis not present

## 2016-03-18 ENCOUNTER — Encounter: Payer: Self-pay | Admitting: Family Medicine

## 2016-03-18 ENCOUNTER — Ambulatory Visit (INDEPENDENT_AMBULATORY_CARE_PROVIDER_SITE_OTHER): Payer: Medicare Other | Admitting: Family Medicine

## 2016-03-18 VITALS — BP 118/64 | HR 64 | Temp 98.2°F | Resp 16 | Wt 123.0 lb

## 2016-03-18 DIAGNOSIS — N3090 Cystitis, unspecified without hematuria: Secondary | ICD-10-CM | POA: Diagnosis not present

## 2016-03-18 LAB — POCT URINALYSIS DIPSTICK
Bilirubin, UA: NEGATIVE
GLUCOSE UA: NEGATIVE
Ketones, UA: NEGATIVE
NITRITE UA: NEGATIVE
Protein, UA: NEGATIVE
Spec Grav, UA: <=1.005
UROBILINOGEN UA: 0.2
pH, UA: 6.5

## 2016-03-18 MED ORDER — SULFAMETHOXAZOLE-TRIMETHOPRIM 800-160 MG PO TABS: 1.0000 | ORAL_TABLET | Freq: Two times a day (BID) | ORAL | 0 refills | Status: DC

## 2016-03-18 NOTE — Progress Notes (Signed)
Patient: Darlene Serrano Female    DOB: Oct 19, 1934   81 y.o.   MRN: [id number] Visit Date: 03/18/2016  Today's Provider: Wilhemena Durie, MD   Chief Complaint  Patient presents with  . Urinary Tract Infection   Subjective:    Urinary Tract Infection   This is a recurrent problem. The current episode started 1 to 4 weeks ago (x 3 weeks). The problem has been waxing and waning (Pt saw cardiology on 03/12/2016, and had UA checked. UA was positve (no culture found in notes), and pt was started on 3 days of Cipro. Is still c/o sx.). Quality: pressure. The pain is at a severity of 5/10. The pain is moderate. There has been no fever. Associated symptoms include frequency and urgency. Pertinent negatives include no chills, discharge, flank pain, hematuria, hesitancy, nausea, sweats or vomiting. Associated symptoms comments: Also c/o Malodorous urine. She has tried antibiotics for the symptoms. The treatment provided moderate relief.       Allergies  Allergen Reactions  . Penicillins Swelling  . Codeine Itching     Current Outpatient Prescriptions:  .  atorvastatin (LIPITOR) 10 MG tablet, Take 1 tablet (10 mg total) by mouth every morning., Disp: 30 tablet, Rfl: 12 .  calcium carbonate (OS-CAL) 600 MG TABS tablet, Take 600 mg by mouth daily. , Disp: , Rfl:  .  cholecalciferol (VITAMIN D) 1000 UNITS tablet, VITAMIN D, 1000UNIT (Oral Tablet)  1 Every Day for 0 days  Quantity: 0.00;  Refills: 0   Ordered :01-May-2010  [name] ;  Started 13-Apr-2009 Active, Disp: , Rfl:  .  metoprolol tartrate (LOPRESSOR) 25 MG tablet, Take 0.5 tablets by mouth 2 (two) times daily., Disp: , Rfl:  .  Multiple Vitamins-Minerals (ICAPS PO), Take 1 tablet by mouth 2 (two) times daily. , Disp: , Rfl:   Review of Systems  Constitutional: Negative for chills.  Eyes: Negative.   Respiratory: Negative.   Cardiovascular: Negative.   Gastrointestinal: Negative for nausea and vomiting.  Endocrine:  Negative.   Genitourinary: Positive for frequency and urgency. Negative for flank pain, hematuria and hesitancy.  Allergic/Immunologic: Negative.   Neurological: Negative.   Psychiatric/Behavioral: Negative.     Social History  Substance Use Topics  . Smoking status: Never Smoker  . Smokeless tobacco: Never Used  . Alcohol use No   Objective:   BP 118/64 (BP Location: Left Arm, Patient Position: Sitting, Cuff Size: Normal)   Pulse 64   Temp 98.2 F (36.8 C) (Oral)   Resp 16   Wt 123 lb (55.8 kg)   BMI 20.47 kg/m   Physical Exam  Constitutional: She is oriented to person, place, and time. She appears well-developed and well-nourished.  HENT:  Head: Normocephalic and atraumatic.  Right Ear: External ear normal.  Left Ear: External ear normal.  Nose: Nose normal.  Eyes: Conjunctivae are normal. No scleral icterus.  Neck: No thyromegaly present.  Cardiovascular: Normal rate, regular rhythm and normal heart sounds.   Pulmonary/Chest: Effort normal and breath sounds normal. No respiratory distress.  Abdominal: Soft. There is tenderness (suprapubic and LLQ).  No CVA tenderness  Musculoskeletal:  Mild increase in thoracic kyphosis.  Lymphadenopathy:    She has no cervical adenopathy.  Neurological: She is alert and oriented to person, place, and time.  Skin: Skin is warm and dry.  Psychiatric: She has a normal mood and affect. Her behavior is normal. Judgment and thought content normal.  Assessment & Plan:     1. Cystitis Start Bactrim. Send for cx. FU pending results. Call if sx fail to improve. - POCT urinalysis dipstick - Urine culture - sulfamethoxazole-trimethoprim (BACTRIM DS,SEPTRA DS) 800-160 MG tablet; Take 1 tablet by mouth 2 (two) times daily.  Dispense: 6 tablet; Refill: 0  Results for orders placed or performed in visit on 03/18/16  POCT urinalysis dipstick  Result Value Ref Range   Color, UA straw    Clarity, UA clear    Glucose, UA Negative     Bilirubin, UA Negative    Ketones, UA Negative    Spec Grav, UA <=1.005    Blood, UA Trace    pH, UA 6.5    Protein, UA Negative    Urobilinogen, UA 0.2    Nitrite, UA Negative    Leukocytes, UA Trace (A) Negative   2.HTN    Patient seen and examined by Miguel Aschoff, MD, and note scribed by Renaldo Fiddler, CMA.  I have done the exam and reviewed the above chart and it is accurate to the best of my knowledge.  Richard Cranford Mon, MD  [facility]

## 2016-03-20 ENCOUNTER — Telehealth: Payer: Self-pay

## 2016-03-20 LAB — URINE CULTURE: ORGANISM ID, BACTERIA: NO GROWTH

## 2016-03-20 NOTE — Telephone Encounter (Signed)
Pt advised.   Thanks,   -Christopherjohn Schiele  

## 2016-03-20 NOTE — Telephone Encounter (Signed)
-----   Message from Jerrol Banana., MD sent at 03/20/2016  9:05 AM EDT ----- Urine normal.

## 2016-03-20 NOTE — Telephone Encounter (Signed)
LMTCB 03/20/2016  Thanks,   -Mickel Baas

## 2016-05-02 ENCOUNTER — Other Ambulatory Visit: Payer: Self-pay | Admitting: Obstetrics & Gynecology

## 2016-05-02 DIAGNOSIS — Z1231 Encounter for screening mammogram for malignant neoplasm of breast: Secondary | ICD-10-CM

## 2016-05-09 DIAGNOSIS — Z471 Aftercare following joint replacement surgery: Secondary | ICD-10-CM | POA: Diagnosis not present

## 2016-05-09 DIAGNOSIS — Z96641 Presence of right artificial hip joint: Secondary | ICD-10-CM | POA: Diagnosis not present

## 2016-05-10 DIAGNOSIS — H353222 Exudative age-related macular degeneration, left eye, with inactive choroidal neovascularization: Secondary | ICD-10-CM | POA: Diagnosis not present

## 2016-06-12 ENCOUNTER — Telehealth: Payer: Self-pay | Admitting: Family Medicine

## 2016-06-12 ENCOUNTER — Ambulatory Visit: Payer: Medicare Other

## 2016-06-12 NOTE — Telephone Encounter (Signed)
Called Pt to schedule AWV with [facility] 2/15- knb

## 2016-07-08 ENCOUNTER — Ambulatory Visit
Admission: RE | Admit: 2016-07-08 | Discharge: 2016-07-08 | Disposition: A | Discharge status: Home / Self Care | Payer: Medicare Other | Source: Ambulatory Visit | Attending: Obstetrics & Gynecology | Admitting: Obstetrics & Gynecology

## 2016-07-08 DIAGNOSIS — Z1231 Encounter for screening mammogram for malignant neoplasm of breast: Secondary | ICD-10-CM | POA: Diagnosis not present

## 2016-07-11 ENCOUNTER — Ambulatory Visit (INDEPENDENT_AMBULATORY_CARE_PROVIDER_SITE_OTHER): Payer: Medicare Other | Admitting: Family Medicine

## 2016-07-11 VITALS — BP 108/66 | HR 68 | Temp 97.9°F | Resp 16 | Wt 119.0 lb

## 2016-07-11 DIAGNOSIS — J302 Other seasonal allergic rhinitis: Secondary | ICD-10-CM | POA: Diagnosis not present

## 2016-07-11 DIAGNOSIS — R002 Palpitations: Secondary | ICD-10-CM | POA: Diagnosis not present

## 2016-07-11 DIAGNOSIS — E7800 Pure hypercholesterolemia, unspecified: Secondary | ICD-10-CM | POA: Diagnosis not present

## 2016-07-11 MED ORDER — LORATADINE 10 MG PO TABS: 10.0000 mg | ORAL_TABLET | Freq: Every day | ORAL | 3 refills | Status: DC

## 2016-07-11 NOTE — Progress Notes (Signed)
Darlene Serrano  MRN: [id number] DOB: 1935/04/27  Subjective:  HPI  Patient is here for follow up. Last office visit was on 03/18/16 for Cystitis. Last follow up office visit was on 01/11/16. Labs: MetC, TSH and CBC on 11/06/15 and Lipid 06/12/15.  Patient has a question about her allergies. She had to stop Allegra and Singulair due to it is interacts with Metoprolol she is on to help palpitations but her allergy symptoms are worsening been off the treatment. She has been seen cardiologist since June 2017 for palpitations and had full work up with EKG, holter monitor and stress test. She was put on Metoprolol and states palpitations are better but still there off and on. She has follow up with Dr Nehemiah Massed in April 20108. Lab Results  Component Value Date   CHOL 143 06/12/2015   HDL 71 06/12/2015   LDLCALC 59 06/12/2015   TRIG 66 06/12/2015   CHOLHDL 2.0 06/12/2015    Patient Active Problem List   Diagnosis Date Noted  . MR (mitral regurgitation) 02/13/2016  . Cough 06/12/2015  . CN (constipation) 01/25/2015  . Sinusitis, acute 01/25/2015  . Malaise and fatigue 10/29/2013  . Allergic rhinitis 07/08/2013  . Status post right hip replacement 07/06/2013  . Postoperative anemia due to acute blood loss 07/01/2013  . OA (osteoarthritis) of hip 06/29/2013  . Osteoarthritis (arthritis due to wear and tear of joints) 04/13/2013  . Rapid palpitations 09/30/2012  . Hypercholesterolemia 08/30/2010  . Asthma 08/30/2010  . Macular degeneration 08/30/2010  . Acid reflux 04/13/2009  . Hypercholesteremia 04/13/2009  . Auditory vertigo 04/13/2009  . Arthritis, degenerative 04/13/2009    Past Medical History:  Diagnosis Date  . Arthritis    RIGHT HIP AND HANDS;  DDD  . Asthma 08/30/2010  . Cancer ([place])    LOCAL EXCISION SKIN CA FROM NOSE  06/11/13  . History of palpitations    YEARS AGO  . Impaired hearing   . Macular degeneration of both eyes    HX OF INJECTIONS INTO LEFT EYE ( ?  AVASTIN ) FROM OCT 2012 TO OCT 2014.  . Osteoporosis   . Pure hypercholesterolemia   . Unspecified vitamin D deficiency     Social History   Social History  . Marital status: Divorced    Spouse name: N/A  . Number of children: N/A  . Years of education: N/A   Occupational History  . Not on file.   Social History Main Topics  . Smoking status: Never Smoker  . Smokeless tobacco: Never Used  . Alcohol use No  . Drug use: No  . Sexual activity: Not on file   Other Topics Concern  . Not on file   Social History Narrative  . No narrative on file    Outpatient Encounter Prescriptions as of 07/11/2016  Medication Sig Note  . atorvastatin (LIPITOR) 10 MG tablet Take 1 tablet (10 mg total) by mouth every morning.   . calcium carbonate (OS-CAL) 600 MG TABS tablet Take 600 mg by mouth daily.  01/25/2015: Received from: [facility]  . cholecalciferol (VITAMIN D) 1000 UNITS tablet VITAMIN D, 1000UNIT (Oral Tablet)  1 Every Day for 0 days  Quantity: 0.00;  Refills: 0   Ordered :01-May-2010  [name] ;  Started [id number] Active 01/25/2015: Received from: [facility]  . metoprolol tartrate (LOPRESSOR) 25 MG tablet Take 0.5 tablets by mouth 2 (two) times daily. 03/18/2016: Received from: [facility]  . Multiple Vitamins-Minerals (ICAPS PO)  Take 1 tablet by mouth 2 (two) times daily.    . [DISCONTINUED] sulfamethoxazole-trimethoprim (BACTRIM DS,SEPTRA DS) 800-160 MG tablet Take 1 tablet by mouth 2 (two) times daily.    No facility-administered encounter medications on file as of 07/11/2016.     Allergies  Allergen Reactions  . Penicillins Swelling  . Codeine Itching    Review of Systems  HENT:       Drainage, runny nose, allergy symptoms.  Respiratory: Negative.   Cardiovascular: Positive for palpitations (at times).  Musculoskeletal: Positive for myalgias (muscle pain/spasm near left breast).  Neurological: Positive for weakness  (at times/rarely per patient.).  Psychiatric/Behavioral: The patient is nervous/anxious.     Objective:  BP 108/66   Pulse 68   Temp 97.9 F (36.6 C)   Resp 16   Wt 119 lb (54 kg)   BMI 19.80 kg/m   Physical Exam  Constitutional: She is oriented to person, place, and time and well-developed, well-nourished, and in no distress.  HENT:  Head: Normocephalic and atraumatic.  Right Ear: External ear normal.  Left Ear: External ear normal.  Eyes: Conjunctivae are normal. Pupils are equal, round, and reactive to light.  Neck: Normal range of motion. Neck supple.  Cardiovascular: Normal rate, regular rhythm, normal heart sounds and intact distal pulses.   No murmur heard. Pulmonary/Chest: Effort normal and breath sounds normal. No respiratory distress. She has no wheezes.  Abdominal: Soft.  Musculoskeletal: She exhibits no edema or tenderness.  Neurological: She is alert and oriented to person, place, and time.  Skin: Skin is warm and dry.  Psychiatric: Mood, memory, affect and judgment normal.    Assessment and Plan :  1. Palpitations On Metoprolol. Following cardiologist. Discuss this issue.  2. Hypercholesterolemia  3. Chronic seasonal allergic rhinitis due to other allergen Discussed this issue in details. Will try Loratadine and see if she has any worsening with palpitations with combination of this and Metoprolol. Advised patient we will start with Loratadine and then after 1 month if symptoms are not controlled will need to add Singulair back maybe. Follow up 2 to 3 months.  HPI, Exam and A&P transcribed under direction and in the presence of Miguel Aschoff, MD. I have done the exam and reviewed the chart and it is accurate to the best of my knowledge. Development worker, community has been used and  any errors in dictation or transcription are unintentional. Miguel Aschoff M.D. [facility]

## 2016-07-16 ENCOUNTER — Ambulatory Visit (INDEPENDENT_AMBULATORY_CARE_PROVIDER_SITE_OTHER): Payer: Medicare Other | Admitting: Family Medicine

## 2016-07-16 ENCOUNTER — Encounter: Payer: Self-pay | Admitting: Family Medicine

## 2016-07-16 VITALS — BP 120/60 | HR 56 | Temp 97.5°F | Resp 16 | Wt 122.0 lb

## 2016-07-16 DIAGNOSIS — N3090 Cystitis, unspecified without hematuria: Secondary | ICD-10-CM | POA: Diagnosis not present

## 2016-07-16 LAB — POCT URINALYSIS DIPSTICK
BILIRUBIN UA: NEGATIVE
Glucose, UA: NEGATIVE
KETONES UA: NEGATIVE
Nitrite, UA: NEGATIVE
Protein, UA: NEGATIVE
Urobilinogen, UA: 1
pH, UA: 6.5

## 2016-07-16 MED ORDER — SULFAMETHOXAZOLE-TRIMETHOPRIM 800-160 MG PO TABS: 1.0000 | ORAL_TABLET | Freq: Two times a day (BID) | ORAL | 0 refills | Status: DC

## 2016-07-16 NOTE — Patient Instructions (Signed)
We will call you with the culture report. 

## 2016-07-16 NOTE — Progress Notes (Signed)
Subjective:     Patient ID: Darlene Serrano, female   DOB: 04-30-1935, 82 y.o.   MRN: [id number]  HPI  Chief Complaint  Patient presents with  . Urinary Frequency    Patient comes into office today with complaints of urinary frequency and abdominal cramping since 2AM.   Last had a UTI in October of last year treated with Bactrim.   Review of Systems     Objective:   Physical Exam  Constitutional: She appears well-developed and well-nourished. No distress.  Genitourinary:  Genitourinary Comments: No cva tenderness       Assessment:    1. Cystitis - Urine culture - POCT urinalysis dipstick - sulfamethoxazole-trimethoprim (BACTRIM DS,SEPTRA DS) 800-160 MG tablet; Take 1 tablet by mouth 2 (two) times daily.  Dispense: 14 tablet; Refill: 0    Plan:    Further f/u pending urine culture.

## 2016-07-18 ENCOUNTER — Telehealth: Payer: Self-pay

## 2016-07-18 DIAGNOSIS — H353222 Exudative age-related macular degeneration, left eye, with inactive choroidal neovascularization: Secondary | ICD-10-CM | POA: Diagnosis not present

## 2016-07-18 DIAGNOSIS — L814 Other melanin hyperpigmentation: Secondary | ICD-10-CM | POA: Diagnosis not present

## 2016-07-18 DIAGNOSIS — D485 Neoplasm of uncertain behavior of skin: Secondary | ICD-10-CM | POA: Diagnosis not present

## 2016-07-18 DIAGNOSIS — L821 Other seborrheic keratosis: Secondary | ICD-10-CM | POA: Diagnosis not present

## 2016-07-18 DIAGNOSIS — Z85828 Personal history of other malignant neoplasm of skin: Secondary | ICD-10-CM | POA: Diagnosis not present

## 2016-07-18 LAB — URINE CULTURE

## 2016-07-18 NOTE — Telephone Encounter (Signed)
Patient has been advised. KW 

## 2016-07-18 NOTE — Telephone Encounter (Signed)
-----   Message from Carmon Ginsberg, [place] sent at 07/18/2016 11:44 AM EST ----- Continue the sulfa antibiotic for your bladder infection.

## 2016-08-16 DIAGNOSIS — H353222 Exudative age-related macular degeneration, left eye, with inactive choroidal neovascularization: Secondary | ICD-10-CM | POA: Diagnosis not present

## 2016-09-02 DIAGNOSIS — C44629 Squamous cell carcinoma of skin of left upper limb, including shoulder: Secondary | ICD-10-CM | POA: Diagnosis not present

## 2016-09-02 DIAGNOSIS — L438 Other lichen planus: Secondary | ICD-10-CM | POA: Diagnosis not present

## 2016-10-09 ENCOUNTER — Ambulatory Visit: Payer: Medicare Other | Admitting: Family Medicine

## 2016-10-15 ENCOUNTER — Ambulatory Visit (INDEPENDENT_AMBULATORY_CARE_PROVIDER_SITE_OTHER): Payer: Medicare Other | Admitting: Family Medicine

## 2016-10-15 ENCOUNTER — Encounter: Payer: Self-pay | Admitting: Family Medicine

## 2016-10-15 VITALS — BP 122/68 | HR 82 | Temp 97.8°F | Resp 16 | Wt 121.0 lb

## 2016-10-15 DIAGNOSIS — G4700 Insomnia, unspecified: Secondary | ICD-10-CM | POA: Diagnosis not present

## 2016-10-15 DIAGNOSIS — R5383 Other fatigue: Secondary | ICD-10-CM | POA: Diagnosis not present

## 2016-10-15 DIAGNOSIS — J3089 Other allergic rhinitis: Secondary | ICD-10-CM | POA: Diagnosis not present

## 2016-10-15 DIAGNOSIS — E7800 Pure hypercholesterolemia, unspecified: Secondary | ICD-10-CM | POA: Diagnosis not present

## 2016-10-15 MED ORDER — TRAZODONE HCL 50 MG PO TABS: 50.0000 mg | ORAL_TABLET | Freq: Every evening | ORAL | 3 refills | Status: DC | PRN

## 2016-10-15 NOTE — Progress Notes (Signed)
Patient: Darlene Serrano Female    DOB: February 25, 1935   82 y.o.   MRN: [id number] Visit Date: 10/15/2016  Today's Provider: Wilhemena Durie, MD   Chief Complaint  Patient presents with  . Allergic Rhinitis   . Insomnia   Subjective:    HPI Patient comes in today for a follow up. She was last seen in the office about 3 months ago. During the visit, it was recommended that she start Loratadine for seasonal allergies. She reports that this has helped her symptoms tremendously.   Patient also mentions that her daughter passed away on 09-26-16 unexpectedly. She reports that she and her daughter lived together, and now she is having trouble sleeping. She has been taking Unisom 1/2 tablet nightly with mild relief. She is requesting something to help her get a full nights rest.   Patient also feels like her Metoprolol could be causing her to be tired. She reports that she was started on Metoprolol back in February (67mos ago), and feels like her fatigue has worsened.     Allergies  Allergen Reactions  . Penicillins Swelling  . Codeine Itching     Current Outpatient Prescriptions:  .  atorvastatin (LIPITOR) 10 MG tablet, Take 1 tablet (10 mg total) by mouth every morning., Disp: 30 tablet, Rfl: 12 .  calcium carbonate (OS-CAL) 600 MG TABS tablet, Take 600 mg by mouth daily. , Disp: , Rfl:  .  cholecalciferol (VITAMIN D) 1000 UNITS tablet, VITAMIN D, 1000UNIT (Oral Tablet)  1 Every Day for 0 days  Quantity: 0.00;  Refills: 0   Ordered :01-May-2010  [name] ;  Started 13-Apr-2009 Active, Disp: , Rfl:  .  loratadine (CLARITIN) 10 MG tablet, Take 1 tablet (10 mg total) by mouth daily., Disp: 90 tablet, Rfl: 3 .  metoprolol tartrate (LOPRESSOR) 25 MG tablet, Take 0.5 tablets by mouth 2 (two) times daily., Disp: , Rfl:  .  Multiple Vitamins-Minerals (ICAPS PO), Take 1 tablet by mouth 2 (two) times daily. , Disp: , Rfl:   Review of Systems  Constitutional: Positive for activity  change and fatigue.  HENT: Positive for postnasal drip.   Eyes: Negative.   Cardiovascular: Positive for palpitations. Negative for chest pain and leg swelling.  Endocrine: Negative.   Musculoskeletal: Negative.   Skin: Negative.   Allergic/Immunologic: Negative.   Neurological: Negative for headaches.  Hematological: Negative.   Psychiatric/Behavioral: Positive for sleep disturbance. The patient is nervous/anxious.     Social History  Substance Use Topics  . Smoking status: Never Smoker  . Smokeless tobacco: Never Used  . Alcohol use No   Objective:   BP 122/68 (BP Location: Right Arm, Patient Position: Sitting, Cuff Size: Normal)   Pulse 82   Temp 97.8 F (36.6 C)   Resp 16   Wt 121 lb (54.9 kg)   SpO2 98%   BMI 20.14 kg/m  Vitals:   10/15/16 1340  BP: 122/68  Pulse: 82  Resp: 16  Temp: 97.8 F (36.6 C)  SpO2: 98%  Weight: 121 lb (54.9 kg)     Physical Exam  Constitutional: She is oriented to person, place, and time. She appears well-developed and well-nourished.  HENT:  Head: Normocephalic and atraumatic.  Right Ear: External ear normal.  Left Ear: External ear normal.  Nose: Nose normal.  Eyes: Conjunctivae are normal. No scleral icterus.  Neck: Neck supple. No thyromegaly present.  Cardiovascular: Normal rate, regular rhythm and normal heart sounds.  Pulmonary/Chest: Effort normal and breath sounds normal.  Abdominal: Soft.  Neurological: She is alert and oriented to person, place, and time. No cranial nerve deficit. She exhibits normal muscle tone. Coordination normal.  Skin: Skin is warm and dry.  Psychiatric: She has a normal mood and affect. Her behavior is normal. Judgment and thought content normal.        Assessment & Plan:     1. Seasonal allergic rhinitis due to other allergic trigger  - CBC with Differential/Platelet  2. Hypercholesterolemia  - Comprehensive metabolic panel - Lipid panel - TSH  3. Insomnia, unspecified type Pt  mourning the recent death of her 33 yo daughter. - traZODone (DESYREL) 50 MG tablet; Take 1 tablet (50 mg total) by mouth at bedtime as needed for sleep.  Dispense: 30 tablet; Refill: 3  4. Fatigue, unspecified type       I have done the exam and reviewed the above chart and it is accurate to the best of my knowledge. Development worker, community has been used in this note in any air is in the dictation or transcription are unintentional.  Wilhemena Durie, MD  [place]

## 2016-10-16 LAB — CBC WITH DIFFERENTIAL/PLATELET
BASOS ABS: 0 10*3/uL (ref 0.0–0.2)
BASOS: 1 %
EOS (ABSOLUTE): 0.1 10*3/uL (ref 0.0–0.4)
EOS: 1 %
HEMATOCRIT: 43.1 % (ref 34.0–46.6)
HEMOGLOBIN: 13.8 g/dL (ref 11.1–15.9)
IMMATURE GRANS (ABS): 0 10*3/uL (ref 0.0–0.1)
Immature Granulocytes: 0 %
LYMPHS: 29 %
Lymphocytes Absolute: 1.7 10*3/uL (ref 0.7–3.1)
MCH: 30.9 pg (ref 26.6–33.0)
MCHC: 32 g/dL (ref 31.5–35.7)
MCV: 96 fL (ref 79–97)
MONOCYTES: 7 %
Monocytes Absolute: 0.4 10*3/uL (ref 0.1–0.9)
NEUTROS ABS: 3.9 10*3/uL (ref 1.4–7.0)
Neutrophils: 62 %
Platelets: 224 10*3/uL (ref 150–379)
RBC: 4.47 x10E6/uL (ref 3.77–5.28)
RDW: 13.9 % (ref 12.3–15.4)
WBC: 6.1 10*3/uL (ref 3.4–10.8)

## 2016-10-16 LAB — COMPREHENSIVE METABOLIC PANEL
ALK PHOS: 60 IU/L (ref 39–117)
ALT: 17 IU/L (ref 0–32)
AST: 25 IU/L (ref 0–40)
Albumin/Globulin Ratio: 2.5 — ABNORMAL HIGH (ref 1.2–2.2)
Albumin: 4.5 g/dL (ref 3.5–4.8)
BUN/Creatinine Ratio: 19 (ref 12–28)
BUN: 15 mg/dL (ref 8–27)
Bilirubin Total: 0.6 mg/dL (ref 0.0–1.2)
CO2: 25 mmol/L (ref 18–29)
Calcium: 9.5 mg/dL (ref 8.7–10.3)
Chloride: 101 mmol/L (ref 96–106)
Creatinine, Ser: 0.77 mg/dL (ref 0.57–1.00)
GFR calc Af Amer: 85 mL/min/{1.73_m2} (ref >59)
GFR calc non Af Amer: 74 mL/min/{1.73_m2} (ref >59)
GLUCOSE: 96 mg/dL (ref 65–99)
Globulin, Total: 1.8 g/dL (ref 1.5–4.5)
Potassium: 4.6 mmol/L (ref 3.5–5.2)
Sodium: 141 mmol/L (ref 134–144)
Total Protein: 6.3 g/dL (ref 6.0–8.5)

## 2016-10-16 LAB — LIPID PANEL
CHOLESTEROL TOTAL: 152 mg/dL (ref 100–199)
Chol/HDL Ratio: 2 ratio (ref 0.0–4.4)
HDL: 76 mg/dL (ref >39)
LDL CALC: 66 mg/dL (ref 0–99)
TRIGLYCERIDES: 50 mg/dL (ref 0–149)
VLDL CHOLESTEROL CAL: 10 mg/dL (ref 5–40)

## 2016-10-16 LAB — TSH: TSH: 2.2 u[IU]/mL (ref 0.450–4.500)

## 2016-10-25 DIAGNOSIS — I493 Ventricular premature depolarization: Secondary | ICD-10-CM | POA: Diagnosis not present

## 2016-10-25 DIAGNOSIS — I340 Nonrheumatic mitral (valve) insufficiency: Secondary | ICD-10-CM | POA: Diagnosis not present

## 2016-10-25 DIAGNOSIS — E782 Mixed hyperlipidemia: Secondary | ICD-10-CM | POA: Diagnosis not present

## 2016-12-02 ENCOUNTER — Ambulatory Visit (INDEPENDENT_AMBULATORY_CARE_PROVIDER_SITE_OTHER): Payer: Medicare Other | Admitting: Family Medicine

## 2016-12-02 VITALS — BP 102/48 | HR 80 | Temp 97.7°F | Resp 16 | Wt 117.0 lb

## 2016-12-02 DIAGNOSIS — I340 Nonrheumatic mitral (valve) insufficiency: Secondary | ICD-10-CM

## 2016-12-02 DIAGNOSIS — J3089 Other allergic rhinitis: Secondary | ICD-10-CM | POA: Diagnosis not present

## 2016-12-02 DIAGNOSIS — R002 Palpitations: Secondary | ICD-10-CM

## 2016-12-02 NOTE — Progress Notes (Signed)
Darlene Serrano  MRN: [id number] DOB: Feb 07, 1935  Subjective:  HPI    The patient is a 82 year old female who presents for follow up of her symptoms from the visit on 10/15/16. Patient is very vague in her answers to my questions and when asked about how she is doing and what she was being seen for she replied by saying she would discuss it with the doctor. AR and insomnia better.Had episode of "extra heart beats" recently. No syncope/presyncope.  Patient Active Problem List   Diagnosis Date Noted  . MR (mitral regurgitation) 02/13/2016  . Cough 06/12/2015  . CN (constipation) 01/25/2015  . Sinusitis, acute 01/25/2015  . Malaise and fatigue 10/29/2013  . Allergic rhinitis 07/08/2013  . Status post right hip replacement 07/06/2013  . Postoperative anemia due to acute blood loss 07/01/2013  . OA (osteoarthritis) of hip 06/29/2013  . Osteoarthritis (arthritis due to wear and tear of joints) 04/13/2013  . Rapid palpitations 09/30/2012  . Hypercholesterolemia 08/30/2010  . Asthma 08/30/2010  . Macular degeneration 08/30/2010  . Acid reflux 04/13/2009  . Hypercholesteremia 04/13/2009  . Auditory vertigo 04/13/2009  . Arthritis, degenerative 04/13/2009    Past Medical History:  Diagnosis Date  . Arthritis    RIGHT HIP AND HANDS;  DDD  . Asthma 08/30/2010  . Cancer ([place])    LOCAL EXCISION SKIN CA FROM NOSE  06/11/13  . History of palpitations    YEARS AGO  . Impaired hearing   . Macular degeneration of both eyes    HX OF INJECTIONS INTO LEFT EYE ( ? AVASTIN ) FROM OCT 2012 TO OCT 2014.  . Osteoporosis   . Pure hypercholesterolemia   . Unspecified vitamin D deficiency     Social History   Social History  . Marital status: Divorced    Spouse name: N/A  . Number of children: N/A  . Years of education: N/A   Occupational History  . Not on file.   Social History Main Topics  . Smoking status: Never Smoker  . Smokeless tobacco: Never Used  . Alcohol use No  . Drug  use: No  . Sexual activity: Not on file   Other Topics Concern  . Not on file   Social History Narrative  . No narrative on file    Outpatient Encounter Prescriptions as of 12/02/2016  Medication Sig Note  . atorvastatin (LIPITOR) 10 MG tablet Take 1 tablet (10 mg total) by mouth every morning.   . calcium carbonate (OS-CAL) 600 MG TABS tablet Take 600 mg by mouth daily.  01/25/2015: Received from: [facility]  . cholecalciferol (VITAMIN D) 1000 UNITS tablet VITAMIN D, 1000UNIT (Oral Tablet)  1 Every Day for 0 days  Quantity: 0.00;  Refills: 0   Ordered :01-May-2010  [name] ;  Started [id number] Active 01/25/2015: Received from: [facility]  . loratadine (CLARITIN) 10 MG tablet Take 1 tablet (10 mg total) by mouth daily.   . metoprolol tartrate (LOPRESSOR) 25 MG tablet Take 0.5 tablets by mouth 2 (two) times daily. 03/18/2016: Received from: [facility]  . Multiple Vitamins-Minerals (ICAPS PO) Take 1 tablet by mouth 2 (two) times daily.    . traZODone (DESYREL) 50 MG tablet Take 1 tablet (50 mg total) by mouth at bedtime as needed for sleep.    No facility-administered encounter medications on file as of 12/02/2016.     Allergies  Allergen Reactions  . Penicillins Swelling  . Codeine Itching  Review of Systems  Constitutional: Negative.   HENT: Negative.   Eyes: Negative.   Respiratory: Negative for cough, shortness of breath and wheezing.   Cardiovascular: Positive for palpitations. Negative for chest pain.  Gastrointestinal: Negative.   Genitourinary: Negative.   Skin: Negative.   Neurological: Positive for tingling.       Occasional tingling down lateral right calf/ankle/foot.  Endo/Heme/Allergies: Negative.   Psychiatric/Behavioral: The patient is nervous/anxious.     Objective:  BP (!) 102/48 (BP Location: Right Arm, Patient Position: Sitting, Cuff Size: Normal)   Pulse 80   Temp 97.7 F (36.5 C) (Oral)   Resp 16    Wt 117 lb (53.1 kg)   BMI 19.47 kg/m   Physical Exam  Constitutional: She is oriented to person, place, and time and well-developed, well-nourished, and in no distress.  HENT:  Head: Normocephalic and atraumatic.  Right Ear: External ear normal.  Left Ear: External ear normal.  Nose: Nose normal.  Eyes: Conjunctivae are normal. No scleral icterus.  Neck: No thyromegaly present.  Cardiovascular: Normal rate, regular rhythm, normal heart sounds and intact distal pulses.   Pulmonary/Chest: Effort normal and breath sounds normal.  Abdominal: Soft. Bowel sounds are normal.  Neurological: She is oriented to person, place, and time. No cranial nerve deficit. She exhibits normal muscle tone. Gait normal. Coordination normal. GCS score is 15.  Skin: Skin is warm and dry.  Psychiatric: Mood, memory, affect and judgment normal.    Assessment and Plan :  Palpitations Pt likely describing PVCs. No Rx necessary presently. Chronic Anxiety Insomnia AR Mitral Regurgitation   HPI, Exam and A&P Transcribed under the direction and in the presence of Wilhemena Durie., MD. Electronically Signed: Althea Charon, RMA I have done the exam and reviewed the chart and it is accurate to the best of my knowledge. Development worker, community has been used and  any errors in dictation or transcription are unintentional. Miguel Aschoff M.D. [facility]

## 2016-12-02 NOTE — Patient Instructions (Signed)
Decrease Metoprolol to half a pill once daily.

## 2016-12-20 DIAGNOSIS — H353222 Exudative age-related macular degeneration, left eye, with inactive choroidal neovascularization: Secondary | ICD-10-CM | POA: Diagnosis not present

## 2017-02-03 ENCOUNTER — Telehealth: Payer: Self-pay | Admitting: Family Medicine

## 2017-02-03 ENCOUNTER — Ambulatory Visit (INDEPENDENT_AMBULATORY_CARE_PROVIDER_SITE_OTHER): Payer: Medicare Other | Admitting: Family Medicine

## 2017-02-03 VITALS — BP 124/62 | HR 84 | Temp 98.1°F | Resp 14 | Wt 116.6 lb

## 2017-02-03 DIAGNOSIS — Z23 Encounter for immunization: Secondary | ICD-10-CM | POA: Diagnosis not present

## 2017-02-03 DIAGNOSIS — G4700 Insomnia, unspecified: Secondary | ICD-10-CM

## 2017-02-03 DIAGNOSIS — R002 Palpitations: Secondary | ICD-10-CM

## 2017-02-03 DIAGNOSIS — R5383 Other fatigue: Secondary | ICD-10-CM | POA: Diagnosis not present

## 2017-02-03 MED ORDER — TRAZODONE HCL 50 MG PO TABS: 50.0000 mg | ORAL_TABLET | Freq: Every evening | ORAL | 5 refills | Status: DC | PRN

## 2017-02-03 NOTE — Progress Notes (Signed)
JOYCIE Serrano  MRN: [id number] DOB: [birth year]  Subjective:  HPI  Patient is here for 2 months follow up. Last office visit 12/02/16. Patient was feeling fatigue and her b/p was on a low side. Patient was advised to decrease Metoprolol to half a pill once daily.  Patient feels much better on the decreased dose of Metoprolol. Energy is much better.  She wanted to ask if it is ok for her to continue taking Trazodone as she needs it. She needs more refills if it is ok to continue. Medication is helping her with her sleep. BP Readings from Last 3 Encounters:  02/03/17 124/62  12/02/16 (!) 102/48  10/15/16 122/68   Patient Active Problem List   Diagnosis Date Noted  . MR (mitral regurgitation) 02/13/2016  . Cough 06/12/2015  . CN (constipation) 01/25/2015  . Sinusitis, acute 01/25/2015  . Malaise and fatigue 10/29/2013  . Allergic rhinitis 07/08/2013  . Status post right hip replacement 07/06/2013  . Postoperative anemia due to acute blood loss 07/01/2013  . OA (osteoarthritis) of hip 06/29/2013  . Osteoarthritis (arthritis due to wear and tear of joints) 04/13/2013  . Rapid palpitations 09/30/2012  . Hypercholesterolemia 08/30/2010  . Asthma 08/30/2010  . Macular degeneration 08/30/2010  . Acid reflux 04/13/2009  . Hypercholesteremia 04/13/2009  . Auditory vertigo 04/13/2009  . Arthritis, degenerative 04/13/2009    Past Medical History:  Diagnosis Date  . Arthritis    RIGHT HIP AND HANDS;  DDD  . Asthma 08/30/2010  . Cancer ([place])    LOCAL EXCISION SKIN CA FROM NOSE  06/11/13  . History of palpitations    YEARS AGO  . Impaired hearing   . Macular degeneration of both eyes    HX OF INJECTIONS INTO LEFT EYE ( ? AVASTIN ) FROM OCT 2012 TO OCT 2014.  . Osteoporosis   . Pure hypercholesterolemia   . Unspecified vitamin D deficiency     Social History   Social History  . Marital status: Divorced    Spouse name: N/A  . Number of children: N/A  . Years of education:  N/A   Occupational History  . Not on file.   Social History Main Topics  . Smoking status: Never Smoker  . Smokeless tobacco: Never Used  . Alcohol use No  . Drug use: No  . Sexual activity: Not on file   Other Topics Concern  . Not on file   Social History Narrative  . No narrative on file    Outpatient Encounter Prescriptions as of 02/03/2017  Medication Sig Note  . atorvastatin (LIPITOR) 10 MG tablet Take 1 tablet (10 mg total) by mouth every morning.   . calcium carbonate (OS-CAL) 600 MG TABS tablet Take 600 mg by mouth daily.  01/25/2015: Received from: [facility]  . cholecalciferol (VITAMIN D) 1000 UNITS tablet VITAMIN D, 1000UNIT (Oral Tablet)  1 Every Day for 0 days  Quantity: 0.00;  Refills: 0   Ordered :01-May-2010  Darlene Serrano ;  Started [id number] Active 01/25/2015: Received from: [facility]  . loratadine (CLARITIN) 10 MG tablet Take 1 tablet (10 mg total) by mouth daily.   . metoprolol tartrate (LOPRESSOR) 25 MG tablet Take 0.5 tablets by mouth daily. Due to low blood pressure 03/18/2016: Received from: [facility]  . Multiple Vitamins-Minerals (ICAPS PO) Take 1 tablet by mouth 2 (two) times daily.    . traZODone (DESYREL) 50 MG tablet Take 1 tablet (50 mg total) by mouth at bedtime  as needed for sleep.    No facility-administered encounter medications on file as of 02/03/2017.     Allergies  Allergen Reactions  . Penicillins Swelling  . Codeine Itching    Review of Systems  Constitutional: Negative.        Patient is trying to eat more and better.  Respiratory: Negative.   Cardiovascular: Negative.   Musculoskeletal: Negative.   Neurological: Negative.   Psychiatric/Behavioral: The patient has insomnia (better).     Objective:  BP 124/62   Pulse 84   Temp 98.1 F (36.7 C)   Resp 14   Wt 116 lb 9.6 oz (52.9 kg)   BMI 19.40 kg/m   Physical Exam  Constitutional: She is oriented to person, place, and  time and well-developed, well-nourished, and in no distress.  HENT:  Head: Normocephalic and atraumatic.  Eyes: Pupils are equal, round, and reactive to light. Conjunctivae are normal.  Neck: Normal range of motion. No thyromegaly present.  Cardiovascular: Normal rate, regular rhythm, normal heart sounds and intact distal pulses.  Exam reveals no gallop.   No murmur heard. Pulmonary/Chest: Effort normal and breath sounds normal. No respiratory distress. She has no wheezes.  Musculoskeletal: She exhibits no edema or tenderness.  Neurological: She is alert and oriented to person, place, and time.   Assessment and Plan :  1. Insomnia, unspecified type Better on the medication. Refill provided.  2. Fatigue, unspecified type Much better on decreased dose of Metoprolol.  3. Need for influenza vaccination - Flu vaccine HIGH DOSE PF (Fluzone High dose)  4. Need for 23-polyvalent pneumococcal polysaccharide vaccine - Pneumococcal polysaccharide vaccine 23-valent greater than or equal to 2yo subcutaneous/IM  5. Palpitation  I have done the exam and reviewed the chart and it is accurate to the best of my knowledge. Development worker, community has been used and  any errors in dictation or transcription are unintentional. Darlene Serrano M.D. Darlene Serrano  St[facility]. Follow as needed.  HPI, Exam and A&P transcribed by Darlene Serrano, RMA under direction and in the presence of Darlene Aschoff, MD.

## 2017-03-06 DIAGNOSIS — Z85828 Personal history of other malignant neoplasm of skin: Secondary | ICD-10-CM | POA: Diagnosis not present

## 2017-03-06 DIAGNOSIS — L570 Actinic keratosis: Secondary | ICD-10-CM | POA: Diagnosis not present

## 2017-03-06 DIAGNOSIS — L821 Other seborrheic keratosis: Secondary | ICD-10-CM | POA: Diagnosis not present

## 2017-03-06 DIAGNOSIS — D692 Other nonthrombocytopenic purpura: Secondary | ICD-10-CM | POA: Diagnosis not present

## 2017-03-06 DIAGNOSIS — C44712 Basal cell carcinoma of skin of right lower limb, including hip: Secondary | ICD-10-CM | POA: Diagnosis not present

## 2017-03-06 DIAGNOSIS — C44719 Basal cell carcinoma of skin of left lower limb, including hip: Secondary | ICD-10-CM | POA: Diagnosis not present

## 2017-03-06 DIAGNOSIS — L814 Other melanin hyperpigmentation: Secondary | ICD-10-CM | POA: Diagnosis not present

## 2017-03-06 DIAGNOSIS — D2262 Melanocytic nevi of left upper limb, including shoulder: Secondary | ICD-10-CM | POA: Diagnosis not present

## 2017-03-11 DIAGNOSIS — H903 Sensorineural hearing loss, bilateral: Secondary | ICD-10-CM | POA: Diagnosis not present

## 2017-03-13 DIAGNOSIS — C44712 Basal cell carcinoma of skin of right lower limb, including hip: Secondary | ICD-10-CM | POA: Diagnosis not present

## 2017-03-17 DIAGNOSIS — W19XXXA Unspecified fall, initial encounter: Secondary | ICD-10-CM | POA: Diagnosis not present

## 2017-03-17 DIAGNOSIS — S299XXA Unspecified injury of thorax, initial encounter: Secondary | ICD-10-CM | POA: Diagnosis not present

## 2017-03-17 DIAGNOSIS — R0781 Pleurodynia: Secondary | ICD-10-CM | POA: Diagnosis not present

## 2017-04-22 ENCOUNTER — Other Ambulatory Visit: Payer: Self-pay | Admitting: Obstetrics & Gynecology

## 2017-04-22 DIAGNOSIS — Z1231 Encounter for screening mammogram for malignant neoplasm of breast: Secondary | ICD-10-CM

## 2017-04-28 DIAGNOSIS — H353211 Exudative age-related macular degeneration, right eye, with active choroidal neovascularization: Secondary | ICD-10-CM | POA: Diagnosis not present

## 2017-04-28 DIAGNOSIS — H353222 Exudative age-related macular degeneration, left eye, with inactive choroidal neovascularization: Secondary | ICD-10-CM | POA: Diagnosis not present

## 2017-05-06 ENCOUNTER — Other Ambulatory Visit: Payer: Self-pay | Admitting: Family Medicine

## 2017-05-06 DIAGNOSIS — G4700 Insomnia, unspecified: Secondary | ICD-10-CM

## 2017-05-06 NOTE — Telephone Encounter (Signed)
[facility] [address] faxed refill request for following medications traZODone (DESYREL) 50 MG tablet.90 day supply requested    Please advise,Thanks [place]

## 2017-05-07 MED ORDER — TRAZODONE HCL 50 MG PO TABS: 50.0000 mg | ORAL_TABLET | Freq: Every evening | ORAL | 3 refills | Status: DC | PRN

## 2017-05-07 NOTE — Telephone Encounter (Signed)
Ok with 3 rf 

## 2017-06-10 DIAGNOSIS — H353211 Exudative age-related macular degeneration, right eye, with active choroidal neovascularization: Secondary | ICD-10-CM | POA: Diagnosis not present

## 2017-06-12 DIAGNOSIS — I340 Nonrheumatic mitral (valve) insufficiency: Secondary | ICD-10-CM | POA: Diagnosis not present

## 2017-06-12 DIAGNOSIS — I493 Ventricular premature depolarization: Secondary | ICD-10-CM | POA: Diagnosis not present

## 2017-06-12 DIAGNOSIS — E782 Mixed hyperlipidemia: Secondary | ICD-10-CM | POA: Diagnosis not present

## 2017-07-02 ENCOUNTER — Other Ambulatory Visit: Payer: Self-pay | Admitting: Family Medicine

## 2017-07-02 MED ORDER — LORATADINE 10 MG PO TABS: 10.0000 mg | ORAL_TABLET | Freq: Every day | ORAL | 3 refills | Status: DC

## 2017-07-02 NOTE — Telephone Encounter (Signed)
Doesn't want o do [place]

## 2017-07-02 NOTE — Telephone Encounter (Signed)
[facility] a refill request for a 90-days supply for the following medication. Thanks [facility]  loratadine (CLARITIN) 10 MG tablet

## 2017-07-09 ENCOUNTER — Ambulatory Visit
Admission: RE | Admit: 2017-07-09 | Discharge: 2017-07-09 | Disposition: A | Discharge status: Home / Self Care | Payer: Medicare Other | Source: Ambulatory Visit | Attending: Obstetrics & Gynecology | Admitting: Obstetrics & Gynecology

## 2017-07-09 DIAGNOSIS — Z1231 Encounter for screening mammogram for malignant neoplasm of breast: Secondary | ICD-10-CM

## 2017-07-10 ENCOUNTER — Other Ambulatory Visit: Payer: Self-pay | Admitting: Obstetrics & Gynecology

## 2017-07-10 DIAGNOSIS — R002 Palpitations: Secondary | ICD-10-CM | POA: Diagnosis not present

## 2017-07-10 DIAGNOSIS — E782 Mixed hyperlipidemia: Secondary | ICD-10-CM | POA: Diagnosis not present

## 2017-07-10 DIAGNOSIS — R928 Other abnormal and inconclusive findings on diagnostic imaging of breast: Secondary | ICD-10-CM

## 2017-07-10 DIAGNOSIS — I499 Cardiac arrhythmia, unspecified: Secondary | ICD-10-CM | POA: Diagnosis not present

## 2017-07-10 DIAGNOSIS — I493 Ventricular premature depolarization: Secondary | ICD-10-CM | POA: Diagnosis not present

## 2017-07-10 DIAGNOSIS — I340 Nonrheumatic mitral (valve) insufficiency: Secondary | ICD-10-CM | POA: Diagnosis not present

## 2017-07-11 ENCOUNTER — Telehealth: Payer: Self-pay | Admitting: Obstetrics & Gynecology

## 2017-07-11 NOTE — Telephone Encounter (Signed)
Pt aware you will call and schedule annual appt with [facility]PH.

## 2017-07-11 NOTE — Telephone Encounter (Signed)
Patient is wanting to know why she needs to schedule for annual. Patient is wanting to know if it has something to due to  her Mammogram. Please advise

## 2017-07-11 NOTE — Telephone Encounter (Signed)
Pt aware its time for her annual, she also wants to know did the Breast center call you about her mammogram? She know she has to get additional images, just worried her that we called her wanting her to come in. Please advise if there is something else besides the annual she should be aware of.

## 2017-07-11 NOTE — Telephone Encounter (Signed)
No, just needs routine follow up images at [facility] and exam here as well (breast as well as any other)

## 2017-07-14 NOTE — Telephone Encounter (Signed)
Called and left voice mail for patient to call back to be schedule °

## 2017-07-15 ENCOUNTER — Ambulatory Visit: Payer: Medicare Other

## 2017-07-15 ENCOUNTER — Ambulatory Visit
Admission: RE | Admit: 2017-07-15 | Discharge: 2017-07-15 | Disposition: A | Discharge status: Home / Self Care | Payer: Medicare Other | Source: Ambulatory Visit | Attending: Obstetrics & Gynecology | Admitting: Obstetrics & Gynecology

## 2017-07-15 DIAGNOSIS — R928 Other abnormal and inconclusive findings on diagnostic imaging of breast: Secondary | ICD-10-CM

## 2017-07-15 DIAGNOSIS — R922 Inconclusive mammogram: Secondary | ICD-10-CM | POA: Diagnosis not present

## 2017-07-15 DIAGNOSIS — R002 Palpitations: Secondary | ICD-10-CM | POA: Diagnosis not present

## 2017-07-16 DIAGNOSIS — I340 Nonrheumatic mitral (valve) insufficiency: Secondary | ICD-10-CM | POA: Diagnosis not present

## 2017-07-16 DIAGNOSIS — I493 Ventricular premature depolarization: Secondary | ICD-10-CM | POA: Diagnosis not present

## 2017-07-16 DIAGNOSIS — E782 Mixed hyperlipidemia: Secondary | ICD-10-CM | POA: Diagnosis not present

## 2017-07-17 ENCOUNTER — Encounter: Payer: Self-pay | Admitting: Obstetrics & Gynecology

## 2017-07-17 NOTE — Telephone Encounter (Signed)
Patient schedule for annual 07/18/17

## 2017-07-18 ENCOUNTER — Ambulatory Visit (INDEPENDENT_AMBULATORY_CARE_PROVIDER_SITE_OTHER): Payer: Medicare Other | Admitting: Obstetrics & Gynecology

## 2017-07-18 ENCOUNTER — Encounter: Payer: Self-pay | Admitting: Obstetrics & Gynecology

## 2017-07-18 VITALS — BP 120/70 | HR 72 | Ht 65.0 in | Wt 121.0 lb

## 2017-07-18 DIAGNOSIS — Z0000 Encounter for general adult medical examination without abnormal findings: Secondary | ICD-10-CM | POA: Diagnosis not present

## 2017-07-18 NOTE — Patient Instructions (Signed)
PAP every 5 years Mammogram every year Labs yearly (with PCP)

## 2017-07-18 NOTE — Progress Notes (Signed)
HPI:      [name] is a 83 y.o. [id number] who LMP was in the past, she presents today for her annual examination.  The patient has no complaints today. The patient is not sexually active. Herlast pap: approximate date 2016 and was normal and last mammogram: approximate date 06/2017 and was normal.  The patient does perform self breast exams.  There is no notable family history of breast or ovarian cancer in her family. The patient is not taking hormone replacement therapy. Patient denies post-menopausal vaginal bleeding.   The patient has regular exercise: yes. The patient denies current symptoms of depression.    GYN Hx: Last Colonoscopy:5 years ago. Normal.  Last DEXA: never ago.    PMHx: Past Medical History:  Diagnosis Date  . Arthritis    RIGHT HIP AND HANDS;  DDD  . Asthma 08/30/2010  . Cancer ([place])    LOCAL EXCISION SKIN CA FROM NOSE  06/11/13  . History of palpitations    YEARS AGO  . Impaired hearing   . Macular degeneration of both eyes    HX OF INJECTIONS INTO LEFT EYE ( ? AVASTIN ) FROM OCT 2012 TO OCT 2014.  . Osteoporosis   . Pure hypercholesterolemia   . Unspecified vitamin D deficiency    Past Surgical History:  Procedure Laterality Date  . 1987 TMJ SURGERY    . 1996 LEFT KNEE ARTHROSCOPY    . ABDOMINAL HYSTERECTOMY  1971  . APPENDECTOMY  1964  . BACK SURGERY  1993   LUMBAR DISCECTOMY  . MOHS SURGERY FOREHEAD FOR BASAL CELL CA  2014  . TOTAL HIP ARTHROPLASTY Right 06/30/2013   Procedure: RIGHT TOTAL HIP ARTHROPLASTY ANTERIOR APPROACH;  Surgeon: Gearlean Alf, MD;  Location: [facility];  Service: Orthopedics;  Laterality: Right;   Family History  Problem Relation Age of Onset  . Congestive Heart Failure Mother   . AAA (abdominal aortic aneurysm) Father   . Cancer Father   . Breast cancer Neg Hx    Social History   Tobacco Use  . Smoking status: Never Smoker  . Smokeless tobacco: Never Used  Substance Use Topics  . Alcohol use: No  . Drug use: No      Current Outpatient Medications:  .  atorvastatin (LIPITOR) 10 MG tablet, Take 1 tablet (10 mg total) by mouth every morning., Disp: 30 tablet, Rfl: 12 .  calcium carbonate (OS-CAL) 600 MG TABS tablet, Take 600 mg by mouth daily. , Disp: , Rfl:  .  cholecalciferol (VITAMIN D) 1000 UNITS tablet, VITAMIN D, 1000UNIT (Oral Tablet)  1 Every Day for 0 days  Quantity: 0.00;  Refills: 0   Ordered :01-May-2010  [name] ;  Started 13-Apr-2009 Active, Disp: , Rfl:  .  loratadine (CLARITIN) 10 MG tablet, Take 1 tablet (10 mg total) by mouth daily., Disp: 90 tablet, Rfl: 3 .  metoprolol tartrate (LOPRESSOR) 25 MG tablet, Take 0.5 tablets by mouth daily. Due to low blood pressure, Disp: , Rfl:  .  Multiple Vitamins-Minerals (ICAPS PO), Take 1 tablet by mouth 2 (two) times daily. , Disp: , Rfl:  .  traZODone (DESYREL) 50 MG tablet, Take 1 tablet (50 mg total) by mouth at bedtime as needed for sleep., Disp: 90 tablet, Rfl: 3 Allergies: Penicillins and Codeine  Review of Systems  Constitutional: Negative for chills, fever and malaise/fatigue.  HENT: Negative for congestion, sinus pain and sore throat.   Eyes: Negative for blurred vision and pain.  Respiratory: Negative for cough and wheezing.   Cardiovascular: Negative for chest pain and leg swelling.  Gastrointestinal: Negative for abdominal pain, constipation, diarrhea, heartburn, nausea and vomiting.  Genitourinary: Negative for dysuria, frequency, hematuria and urgency.  Musculoskeletal: Negative for back pain, joint pain, myalgias and neck pain.  Skin: Negative for itching and rash.  Neurological: Negative for dizziness, tremors and weakness.  Endo/Heme/Allergies: Does not bruise/bleed easily.  Psychiatric/Behavioral: Negative for depression. The patient is not nervous/anxious and does not have insomnia.     Objective: BP 120/70   Pulse 72   Ht 5\' 5"  (1.651 m)   Wt 121 lb (54.9 kg)   BMI 20.14 kg/m   Filed Weights   07/18/17 1433   Weight: 121 lb (54.9 kg)   Body mass index is 20.14 kg/m. Physical Exam  Constitutional: She is oriented to person, place, and time. She appears well-developed and well-nourished. No distress.  Genitourinary: Rectum normal and vagina normal. Pelvic exam was performed with patient supine. There is no rash or lesion on the right labia. There is no rash or lesion on the left labia. Vagina exhibits no lesion. No bleeding in the vagina. Right adnexum does not display mass and does not display tenderness. Left adnexum does not display mass and does not display tenderness.  Genitourinary Comments: Absent Uterus Absent cervix Vaginal cuff well healed Atrophy Min cystocele  HENT:  Head: Normocephalic and atraumatic. Head is without laceration.  Right Ear: Hearing normal.  Left Ear: Hearing normal.  Nose: No epistaxis.  No foreign bodies.  Mouth/Throat: Uvula is midline, oropharynx is clear and moist and mucous membranes are normal.  Eyes: Pupils are equal, round, and reactive to light.  Neck: Normal range of motion. Neck supple. No thyromegaly present.  Cardiovascular: Normal rate and regular rhythm. Exam reveals no gallop and no friction rub.  No murmur heard. Pulmonary/Chest: Effort normal and breath sounds normal. No respiratory distress. She has no wheezes. Right breast exhibits no mass, no skin change and no tenderness. Left breast exhibits no mass, no skin change and no tenderness.  Abdominal: Soft. Bowel sounds are normal. She exhibits no distension. There is no tenderness. There is no rebound.  Musculoskeletal: Normal range of motion.  Neurological: She is alert and oriented to person, place, and time. No cranial nerve deficit.  Skin: Skin is warm and dry.  Psychiatric: She has a normal mood and affect. Judgment normal.  Vitals reviewed.   Assessment: Annual Exam 1. Annual physical exam     Plan:            1.  Cervical Screening-  Pap smear schedule reviewed with patient  2.  Breast screening- Exam annually and mammogram scheduled  3. Colonoscopy every 10 years, Hemoccult testing after age 72  4. Labs managed by PCP  5. Counseling for hormonal therapy: none     F/U  Return in about 1 year (around 07/18/2018) for Annual.  Barnett Applebaum, MD, [facility], [facility] 07/18/2017  2:56 PM

## 2017-07-22 DIAGNOSIS — H353211 Exudative age-related macular degeneration, right eye, with active choroidal neovascularization: Secondary | ICD-10-CM | POA: Diagnosis not present

## 2017-07-31 ENCOUNTER — Encounter: Payer: Self-pay | Admitting: Family Medicine

## 2017-07-31 ENCOUNTER — Ambulatory Visit (INDEPENDENT_AMBULATORY_CARE_PROVIDER_SITE_OTHER): Payer: Medicare Other | Admitting: Family Medicine

## 2017-07-31 VITALS — BP 140/68 | HR 62 | Temp 97.5°F | Resp 14 | Ht 65.0 in | Wt 120.0 lb

## 2017-07-31 DIAGNOSIS — Z0000 Encounter for general adult medical examination without abnormal findings: Secondary | ICD-10-CM

## 2017-07-31 DIAGNOSIS — G4700 Insomnia, unspecified: Secondary | ICD-10-CM | POA: Diagnosis not present

## 2017-07-31 DIAGNOSIS — F419 Anxiety disorder, unspecified: Secondary | ICD-10-CM | POA: Diagnosis not present

## 2017-07-31 DIAGNOSIS — R002 Palpitations: Secondary | ICD-10-CM | POA: Diagnosis not present

## 2017-07-31 DIAGNOSIS — E7800 Pure hypercholesterolemia, unspecified: Secondary | ICD-10-CM

## 2017-07-31 MED ORDER — TRAZODONE HCL 50 MG PO TABS: 50.0000 mg | ORAL_TABLET | Freq: Every evening | ORAL | 3 refills | Status: DC | PRN

## 2017-07-31 NOTE — Progress Notes (Signed)
Patient: Darlene Serrano, Female    DOB: 08-Nov-1934, 83 y.o.   MRN: [id number] Visit Date: 07/31/2017  Today's Provider: Wilhemena Durie, MD   Chief Complaint  Patient presents with  . Medicare Wellness   Subjective:    Annual wellness visit Darlene Serrano is a 83 y.o. female. She feels well. She reports exercising 5-6 times a week and is also very active. She reports she is sleeping fairly well.  ----------------------------------------------------------- Colonoscopy- 08/18/12 diverticulosis, portion of ilium seen was normal, otherwise normal. No further follow up  Mammogram- 07/15/17 Dexa-05/23/09 osteopenia Pap Dr. Kenton Kingfisher and he recommends every 5 years s/p hysterectomy has no cervix.   Review of Systems  Constitutional: Negative.   HENT: Positive for rhinorrhea, sinus pressure and sinus pain.   Eyes: Negative.   Respiratory: Negative.   Cardiovascular: Negative.   Gastrointestinal: Negative.   Endocrine: Negative.   Genitourinary: Negative.   Musculoskeletal: Positive for myalgias.  Skin: Negative.   Allergic/Immunologic: Negative.   Neurological: Positive for headaches.  Hematological: Negative.   Psychiatric/Behavioral: Negative.     Social History   Socioeconomic History  . Marital status: Divorced    Spouse name: Not on file  . Number of children: Not on file  . Years of education: Not on file  . Highest education level: Not on file  Social Needs  . Financial resource strain: Not on file  . Food insecurity - worry: Not on file  . Food insecurity - inability: Not on file  . Transportation needs - medical: Not on file  . Transportation needs - non-medical: Not on file  Occupational History  . Not on file  Tobacco Use  . Smoking status: Never Smoker  . Smokeless tobacco: Never Used  Substance and Sexual Activity  . Alcohol use: No  . Drug use: No  . Sexual activity: Not on file  Other Topics Concern  . Not on file  Social History  Narrative  . Not on file    Past Medical History:  Diagnosis Date  . Arthritis    RIGHT HIP AND HANDS;  DDD  . Asthma 08/30/2010  . Cancer ([place])    LOCAL EXCISION SKIN CA FROM NOSE  06/11/13  . History of palpitations    YEARS AGO  . Impaired hearing   . Macular degeneration of both eyes    HX OF INJECTIONS INTO LEFT EYE ( ? AVASTIN ) FROM OCT 2012 TO OCT 2014.  . Osteoporosis   . Pure hypercholesterolemia   . Unspecified vitamin D deficiency      Patient Active Problem List   Diagnosis Date Noted  . MR (mitral regurgitation) 02/13/2016  . Cough 06/12/2015  . CN (constipation) 01/25/2015  . Sinusitis, acute 01/25/2015  . Malaise and fatigue 10/29/2013  . Allergic rhinitis 07/08/2013  . Status post right hip replacement 07/06/2013  . Postoperative anemia due to acute blood loss 07/01/2013  . OA (osteoarthritis) of hip 06/29/2013  . Osteoarthritis (arthritis due to wear and tear of joints) 04/13/2013  . Rapid palpitations 09/30/2012  . Hypercholesterolemia 08/30/2010  . Asthma 08/30/2010  . Macular degeneration 08/30/2010  . Acid reflux 04/13/2009  . Hypercholesteremia 04/13/2009  . Auditory vertigo 04/13/2009  . Arthritis, degenerative 04/13/2009    Past Surgical History:  Procedure Laterality Date  . 1987 TMJ SURGERY    . 1996 LEFT KNEE ARTHROSCOPY    . ABDOMINAL HYSTERECTOMY  1971  . APPENDECTOMY  1964  . BACK SURGERY  1993   LUMBAR DISCECTOMY  . MOHS SURGERY FOREHEAD FOR BASAL CELL CA  2014  . TOTAL HIP ARTHROPLASTY Right 06/30/2013   Procedure: RIGHT TOTAL HIP ARTHROPLASTY ANTERIOR APPROACH;  Surgeon: Gearlean Alf, MD;  Location: [facility];  Service: Orthopedics;  Laterality: Right;    Her family history includes AAA (abdominal aortic aneurysm) in her father; Cancer in her father; Congestive Heart Failure in her mother. There is no history of Breast cancer.      Current Outpatient Medications:  .  atorvastatin (LIPITOR) 10 MG tablet, Take 1 tablet (10 mg  total) by mouth every morning., Disp: 30 tablet, Rfl: 12 .  calcium carbonate (OS-CAL) 600 MG TABS tablet, Take 600 mg by mouth daily. , Disp: , Rfl:  .  cholecalciferol (VITAMIN D) 1000 UNITS tablet, VITAMIN D, 1000UNIT (Oral Tablet)  1 Every Day for 0 days  Quantity: 0.00;  Refills: 0   Ordered :01-May-2010  [name] ;  Started 13-Apr-2009 Active, Disp: , Rfl:  .  loratadine (CLARITIN) 10 MG tablet, Take 1 tablet (10 mg total) by mouth daily., Disp: 90 tablet, Rfl: 3 .  metoprolol succinate (TOPROL-XL) 25 MG 24 hr tablet, Take by mouth., Disp: , Rfl:  .  Multiple Vitamins-Minerals (ICAPS PO), Take 1 tablet by mouth 2 (two) times daily. , Disp: , Rfl:  .  traZODone (DESYREL) 50 MG tablet, Take 1 tablet (50 mg total) by mouth at bedtime as needed for sleep., Disp: 90 tablet, Rfl: 3  Patient Care Team: Jerrol Banana., MD as PCP - General (Family Medicine)     Objective:   Vitals: BP 140/68 (BP Location: Right Arm, Patient Position: Sitting, Cuff Size: Normal)   Pulse 62   Temp (!) 97.5 F (36.4 C) (Oral)   Resp 14   Ht 5\' 5"  (1.651 m)   Wt 120 lb (54.4 kg)   SpO2 99%   BMI 19.97 kg/m   Physical Exam  Constitutional: She is oriented to person, place, and time. She appears well-developed and well-nourished.  HENT:  Head: Normocephalic and atraumatic.  Eyes: Conjunctivae are normal. No scleral icterus.  Neck: No thyromegaly present.  Cardiovascular: Normal rate, regular rhythm and normal heart sounds.  Pulmonary/Chest: Effort normal and breath sounds normal.  Abdominal: Soft.  Neurological: She is alert and oriented to person, place, and time.  Skin: Skin is warm and dry.  Psychiatric: She has a normal mood and affect. Her behavior is normal. Judgment and thought content normal.  pt declines some of exam.  Activities of Daily Living In your present state of health, do you have any difficulty performing the following activities: 07/31/2017  Hearing? N  Vision? N    Difficulty concentrating or making decisions? N  Walking or climbing stairs? N  Dressing or bathing? N  Doing errands, shopping? N  Some recent data might be hidden    Fall Risk Assessment Fall Risk  07/31/2017 10/15/2016 05/04/2015  Falls in the past year? Yes No No  Number falls in past yr: 1 - -  Injury with Fall? No - -  Follow up Falls prevention discussed - -     Depression Screen PHQ 2/9 Scores 07/31/2017 10/15/2016 05/04/2015  PHQ - 2 Score 0 2 0  PHQ- 9 Score 0 5 -     Cognitive Testing - 6-CIT  Correct? Score   What year is it? yes 0 0 or 4  What month is it? yes 0 0 or 3  Memorize:  [place],  30,  [place],  [place],      What time is it? (within 1 hour) yes 0 0 or 3  Count backwards from 20 yes 0 0, 2, or 4  Name the months of the year yes 0 0, 2, or 4  Repeat name & address above yes 2 0, 2, 4, 6, 8, or 10       TOTAL SCORE  2/28   Interpretation:  Normal  Normal (0-7) Abnormal (8-28)       Assessment & Plan:     Annual Wellness Visit  Reviewed patient's Family Medical History Reviewed and updated list of patient's medical providers Assessment of cognitive impairment was done Assessed patient's functional ability Established a written schedule for health screening [place] Completed and Reviewed  Exercise Activities and Dietary recommendations Goals    None      Immunization History  Administered Date(s) Administered  . Influenza, High Dose Seasonal PF 03/02/2015, 03/09/2016, 02/03/2017  . Pneumococcal Conjugate-13 04/15/2014  . Pneumococcal Polysaccharide-23 02/03/2017  . Tdap 02/09/2011    Health Maintenance  Topic Date Due  . Samul Dada  02/08/2021  . INFLUENZA VACCINE  Completed  . DEXA SCAN  Completed  . PNA vac Low Risk Adult  Completed     Discussed health benefits of physical activity, and encouraged her to engage in regular exercise appropriate for her age and condition.   HTN HLD Insomnia AKs Per derm--Dr Pablo Ledger.   ------------------------------------------------------------------------------------------------------------   I have done the exam and reviewed the above chart and it is accurate to the best of my knowledge. Development worker, community has been used in this note in any air is in the dictation or transcription are unintentional.  Wilhemena Durie, MD  [place] [facility]

## 2017-08-04 DIAGNOSIS — R002 Palpitations: Secondary | ICD-10-CM | POA: Diagnosis not present

## 2017-08-04 DIAGNOSIS — E7800 Pure hypercholesterolemia, unspecified: Secondary | ICD-10-CM | POA: Diagnosis not present

## 2017-08-05 LAB — COMPREHENSIVE METABOLIC PANEL
ALBUMIN: 4.3 g/dL (ref 3.5–4.7)
ALK PHOS: 61 IU/L (ref 39–117)
ALT: 18 IU/L (ref 0–32)
AST: 27 IU/L (ref 0–40)
Albumin/Globulin Ratio: 2.5 — ABNORMAL HIGH (ref 1.2–2.2)
BILIRUBIN TOTAL: 0.7 mg/dL (ref 0.0–1.2)
BUN / CREAT RATIO: 17 (ref 12–28)
BUN: 13 mg/dL (ref 8–27)
CHLORIDE: 99 mmol/L (ref 96–106)
CO2: 23 mmol/L (ref 20–29)
Calcium: 9.1 mg/dL (ref 8.7–10.3)
Creatinine, Ser: 0.76 mg/dL (ref 0.57–1.00)
GFR calc Af Amer: 86 mL/min/{1.73_m2} (ref >59)
GFR calc non Af Amer: 74 mL/min/{1.73_m2} (ref >59)
GLUCOSE: 90 mg/dL (ref 65–99)
Globulin, Total: 1.7 g/dL (ref 1.5–4.5)
Potassium: 4.5 mmol/L (ref 3.5–5.2)
Sodium: 139 mmol/L (ref 134–144)
Total Protein: 6 g/dL (ref 6.0–8.5)

## 2017-08-05 LAB — CBC WITH DIFFERENTIAL/PLATELET
Basophils Absolute: 0 10*3/uL (ref 0.0–0.2)
Basos: 1 %
EOS (ABSOLUTE): 0.2 10*3/uL (ref 0.0–0.4)
Eos: 3 %
Hematocrit: 38.6 % (ref 34.0–46.6)
Hemoglobin: 12.3 g/dL (ref 11.1–15.9)
Immature Grans (Abs): 0 10*3/uL (ref 0.0–0.1)
Immature Granulocytes: 0 %
Lymphocytes Absolute: 1.6 10*3/uL (ref 0.7–3.1)
Lymphs: 28 %
MCH: 30.8 pg (ref 26.6–33.0)
MCHC: 31.9 g/dL (ref 31.5–35.7)
MCV: 97 fL (ref 79–97)
Monocytes Absolute: 0.4 10*3/uL (ref 0.1–0.9)
Monocytes: 8 %
Neutrophils Absolute: 3.4 10*3/uL (ref 1.4–7.0)
Neutrophils: 60 %
Platelets: 211 10*3/uL (ref 150–379)
RBC: 3.99 x10E6/uL (ref 3.77–5.28)
RDW: 14 % (ref 12.3–15.4)
WBC: 5.6 10*3/uL (ref 3.4–10.8)

## 2017-08-05 LAB — LIPID PANEL
CHOL/HDL RATIO: 2 ratio (ref 0.0–4.4)
Cholesterol, Total: 141 mg/dL (ref 100–199)
HDL: 71 mg/dL (ref >39)
LDL Calculated: 57 mg/dL (ref 0–99)
Triglycerides: 67 mg/dL (ref 0–149)
VLDL CHOLESTEROL CAL: 13 mg/dL (ref 5–40)

## 2017-08-05 LAB — TSH: TSH: 1.22 u[IU]/mL (ref 0.450–4.500)

## 2017-08-07 ENCOUNTER — Telehealth: Payer: Self-pay

## 2017-08-07 NOTE — Telephone Encounter (Signed)
Pt advised.   Thanks,   -Laura  

## 2017-08-07 NOTE — Telephone Encounter (Signed)
-----   Message from Jerrol Banana., MD sent at 08/07/2017  2:07 PM EDT ----- Labs OK.

## 2017-08-28 DIAGNOSIS — I493 Ventricular premature depolarization: Secondary | ICD-10-CM | POA: Diagnosis not present

## 2017-08-28 DIAGNOSIS — E782 Mixed hyperlipidemia: Secondary | ICD-10-CM | POA: Diagnosis not present

## 2017-08-28 DIAGNOSIS — I340 Nonrheumatic mitral (valve) insufficiency: Secondary | ICD-10-CM | POA: Diagnosis not present

## 2017-08-29 DIAGNOSIS — Z96641 Presence of right artificial hip joint: Secondary | ICD-10-CM | POA: Insufficient documentation

## 2017-08-29 DIAGNOSIS — M25551 Pain in right hip: Secondary | ICD-10-CM | POA: Diagnosis not present

## 2017-08-29 DIAGNOSIS — Z471 Aftercare following joint replacement surgery: Secondary | ICD-10-CM | POA: Diagnosis not present

## 2017-08-29 HISTORY — DX: Presence of right artificial hip joint: Z96.641

## 2017-09-04 DIAGNOSIS — D225 Melanocytic nevi of trunk: Secondary | ICD-10-CM | POA: Diagnosis not present

## 2017-09-04 DIAGNOSIS — L570 Actinic keratosis: Secondary | ICD-10-CM | POA: Diagnosis not present

## 2017-09-04 DIAGNOSIS — L821 Other seborrheic keratosis: Secondary | ICD-10-CM | POA: Diagnosis not present

## 2017-09-04 DIAGNOSIS — L814 Other melanin hyperpigmentation: Secondary | ICD-10-CM | POA: Diagnosis not present

## 2017-09-04 DIAGNOSIS — Z85828 Personal history of other malignant neoplasm of skin: Secondary | ICD-10-CM | POA: Diagnosis not present

## 2017-09-04 DIAGNOSIS — L239 Allergic contact dermatitis, unspecified cause: Secondary | ICD-10-CM | POA: Diagnosis not present

## 2017-09-04 DIAGNOSIS — C44612 Basal cell carcinoma of skin of right upper limb, including shoulder: Secondary | ICD-10-CM | POA: Diagnosis not present

## 2017-09-16 DIAGNOSIS — H353222 Exudative age-related macular degeneration, left eye, with inactive choroidal neovascularization: Secondary | ICD-10-CM | POA: Diagnosis not present

## 2017-09-16 DIAGNOSIS — H353211 Exudative age-related macular degeneration, right eye, with active choroidal neovascularization: Secondary | ICD-10-CM | POA: Diagnosis not present

## 2017-09-19 DIAGNOSIS — R002 Palpitations: Secondary | ICD-10-CM | POA: Diagnosis not present

## 2017-09-19 DIAGNOSIS — I493 Ventricular premature depolarization: Secondary | ICD-10-CM | POA: Diagnosis not present

## 2017-09-25 DIAGNOSIS — E782 Mixed hyperlipidemia: Secondary | ICD-10-CM | POA: Diagnosis not present

## 2017-09-25 DIAGNOSIS — I340 Nonrheumatic mitral (valve) insufficiency: Secondary | ICD-10-CM | POA: Diagnosis not present

## 2017-09-25 DIAGNOSIS — I480 Paroxysmal atrial fibrillation: Secondary | ICD-10-CM | POA: Insufficient documentation

## 2017-09-25 DIAGNOSIS — I493 Ventricular premature depolarization: Secondary | ICD-10-CM | POA: Diagnosis not present

## 2017-09-29 DIAGNOSIS — I480 Paroxysmal atrial fibrillation: Secondary | ICD-10-CM | POA: Diagnosis not present

## 2017-09-29 DIAGNOSIS — R000 Tachycardia, unspecified: Secondary | ICD-10-CM | POA: Diagnosis not present

## 2017-09-29 DIAGNOSIS — R002 Palpitations: Secondary | ICD-10-CM | POA: Diagnosis not present

## 2017-09-29 DIAGNOSIS — I4891 Unspecified atrial fibrillation: Secondary | ICD-10-CM | POA: Diagnosis not present

## 2017-09-30 DIAGNOSIS — E782 Mixed hyperlipidemia: Secondary | ICD-10-CM | POA: Diagnosis not present

## 2017-09-30 DIAGNOSIS — I480 Paroxysmal atrial fibrillation: Secondary | ICD-10-CM | POA: Diagnosis not present

## 2017-09-30 DIAGNOSIS — I493 Ventricular premature depolarization: Secondary | ICD-10-CM | POA: Diagnosis not present

## 2017-10-16 DIAGNOSIS — R0602 Shortness of breath: Secondary | ICD-10-CM | POA: Diagnosis not present

## 2017-10-16 DIAGNOSIS — I480 Paroxysmal atrial fibrillation: Secondary | ICD-10-CM | POA: Diagnosis not present

## 2017-11-03 DIAGNOSIS — Z79899 Other long term (current) drug therapy: Secondary | ICD-10-CM | POA: Diagnosis not present

## 2017-11-03 DIAGNOSIS — R0602 Shortness of breath: Secondary | ICD-10-CM | POA: Diagnosis not present

## 2017-11-03 DIAGNOSIS — Z5181 Encounter for therapeutic drug level monitoring: Secondary | ICD-10-CM | POA: Diagnosis not present

## 2017-11-03 DIAGNOSIS — I480 Paroxysmal atrial fibrillation: Secondary | ICD-10-CM | POA: Diagnosis not present

## 2017-11-03 DIAGNOSIS — I493 Ventricular premature depolarization: Secondary | ICD-10-CM | POA: Diagnosis not present

## 2017-11-13 DIAGNOSIS — R0602 Shortness of breath: Secondary | ICD-10-CM | POA: Diagnosis not present

## 2017-11-13 DIAGNOSIS — I480 Paroxysmal atrial fibrillation: Secondary | ICD-10-CM | POA: Diagnosis not present

## 2017-11-13 DIAGNOSIS — I493 Ventricular premature depolarization: Secondary | ICD-10-CM | POA: Diagnosis not present

## 2017-11-25 DIAGNOSIS — H353211 Exudative age-related macular degeneration, right eye, with active choroidal neovascularization: Secondary | ICD-10-CM | POA: Diagnosis not present

## 2017-12-15 ENCOUNTER — Ambulatory Visit (INDEPENDENT_AMBULATORY_CARE_PROVIDER_SITE_OTHER): Payer: Medicare Other | Admitting: Family Medicine

## 2017-12-15 VITALS — BP 100/64 | HR 52 | Temp 97.5°F | Resp 16 | Wt 120.0 lb

## 2017-12-15 DIAGNOSIS — J0100 Acute maxillary sinusitis, unspecified: Secondary | ICD-10-CM

## 2017-12-15 MED ORDER — DOXYCYCLINE HYCLATE 100 MG PO TABS: 100.0000 mg | ORAL_TABLET | Freq: Two times a day (BID) | ORAL | 0 refills | Status: DC

## 2017-12-15 NOTE — Progress Notes (Signed)
Patient: Darlene Serrano Female    DOB: 03-31-1935   83 y.o.   MRN: [id number] Visit Date: 12/15/2017  Today's Provider: Wilhemena Durie, MD   Chief Complaint  Patient presents with  . Sore Throat  . Ear Pain   Subjective:    Sore Throat   This is a new problem. The current episode started in the past 7 days. The problem has been gradually worsening. The pain is worse on the right side. Associated symptoms include congestion, coughing, ear pain and headaches. Pertinent negatives include no abdominal pain, diarrhea, drooling, ear discharge, hoarse voice, plugged ear sensation, shortness of breath, stridor, swollen glands, trouble swallowing or vomiting. She has tried acetaminophen for the symptoms.  Otalgia   There is pain in the right ear. This is a new problem. The current episode started in the past 7 days. The problem has been gradually worsening. There has been no fever. Associated symptoms include coughing, headaches and a sore throat. Pertinent negatives include no abdominal pain, diarrhea, ear discharge, hearing loss, rhinorrhea or vomiting. She has tried acetaminophen for the symptoms.       Allergies  Allergen Reactions  . Penicillins Swelling  . Codeine Itching     Current Outpatient Medications:  .  atorvastatin (LIPITOR) 10 MG tablet, Take 1 tablet (10 mg total) by mouth every morning., Disp: 30 tablet, Rfl: 12 .  calcium carbonate (OS-CAL) 600 MG TABS tablet, Take 600 mg by mouth daily. , Disp: , Rfl:  .  cholecalciferol (VITAMIN D) 1000 UNITS tablet, VITAMIN D, 1000UNIT (Oral Tablet)  1 Every Day for 0 days  Quantity: 0.00;  Refills: 0   Ordered :01-May-2010  [name] ;  Started 13-Apr-2009 Active, Disp: , Rfl:  .  ELIQUIS 5 MG TABS tablet, , Disp: , Rfl:  .  flecainide (TAMBOCOR) 50 MG tablet, Take by mouth., Disp: , Rfl:  .  loratadine (CLARITIN) 10 MG tablet, Take 1 tablet (10 mg total) by mouth daily., Disp: 90 tablet, Rfl: 3 .  metoprolol  succinate (TOPROL-XL) 25 MG 24 hr tablet, Take by mouth., Disp: , Rfl:  .  Multiple Vitamins-Minerals (ICAPS PO), Take 1 tablet by mouth 2 (two) times daily. , Disp: , Rfl:  .  traZODone (DESYREL) 50 MG tablet, Take 1 tablet (50 mg total) by mouth at bedtime as needed for sleep. (Patient not taking: Reported on 12/15/2017), Disp: 90 tablet, Rfl: 3  Review of Systems  Constitutional: Positive for fatigue. Negative for activity change, appetite change, chills, diaphoresis, fever and unexpected weight change.  HENT: Positive for congestion, ear pain, postnasal drip, sinus pressure, sinus pain and sore throat. Negative for drooling, ear discharge, hearing loss, hoarse voice, rhinorrhea, tinnitus and trouble swallowing.   Eyes: Negative.   Respiratory: Positive for cough. Negative for apnea, choking, chest tightness, shortness of breath, wheezing and stridor.   Gastrointestinal: Negative.  Negative for abdominal pain, diarrhea and vomiting.  Allergic/Immunologic: Negative.   Neurological: Positive for headaches. Negative for dizziness and light-headedness.  Psychiatric/Behavioral: Negative.     Social History   Tobacco Use  . Smoking status: Never Smoker  . Smokeless tobacco: Never Used  Substance Use Topics  . Alcohol use: No   Objective:   BP 100/64 (BP Location: Right Arm, Patient Position: Sitting, Cuff Size: Normal)   Pulse (!) 52   Temp (!) 97.5 F (36.4 C) (Oral)   Resp 16   Wt 120 lb (54.4 kg)   BMI  19.97 kg/m  Vitals:   12/15/17 0926  BP: 100/64  Pulse: (!) 52  Resp: 16  Temp: (!) 97.5 F (36.4 C)  TempSrc: Oral  Weight: 120 lb (54.4 kg)     Physical Exam  Constitutional: She appears well-developed and well-nourished.  HENT:  Head: Normocephalic and atraumatic.  Right Ear: Hearing and tympanic membrane normal.  Mouth/Throat: Oropharynx is clear and moist.  Eyes: Pupils are equal, round, and reactive to light.  Neck: Normal range of motion. Neck supple.    Cardiovascular: Normal rate and regular rhythm.  Pulmonary/Chest: Effort normal.  Abdominal: Soft.  Neurological: She is alert.  Skin: Skin is warm.  Psychiatric: She has a normal mood and affect.        Assessment & Plan:     1. Acute maxillary sinusitis, recurrence not specified  - doxycycline (VIBRA-TABS) 100 MG tablet; Take 1 tablet (100 mg total) by mouth 2 (two) times daily.  Dispense: 14 tablet; Refill: 0      I have done the exam and reviewed the chart and it is accurate to the best of my knowledge. Development worker, community has been used and  any errors in dictation or transcription are unintentional. Miguel Aschoff M.D. [place], MD  [facility]

## 2017-12-22 ENCOUNTER — Other Ambulatory Visit: Payer: Self-pay | Admitting: Family Medicine

## 2017-12-22 DIAGNOSIS — J0100 Acute maxillary sinusitis, unspecified: Secondary | ICD-10-CM

## 2017-12-22 MED ORDER — DOXYCYCLINE HYCLATE 100 MG PO TABS: 100.0000 mg | ORAL_TABLET | Freq: Two times a day (BID) | ORAL | 0 refills | Status: DC

## 2017-12-22 NOTE — Telephone Encounter (Signed)
Pt contacted office for refill request on the following medications:  doxycycline (VIBRA-TABS) 100 MG tablet   [facility]  Pt stated that she isn't feeling better and she is requesting another round of the medication. Please advise. Thanks TNP

## 2018-01-28 ENCOUNTER — Ambulatory Visit (INDEPENDENT_AMBULATORY_CARE_PROVIDER_SITE_OTHER): Payer: Medicare Other

## 2018-01-28 DIAGNOSIS — Z23 Encounter for immunization: Secondary | ICD-10-CM

## 2018-02-03 DIAGNOSIS — H353211 Exudative age-related macular degeneration, right eye, with active choroidal neovascularization: Secondary | ICD-10-CM | POA: Diagnosis not present

## 2018-02-05 ENCOUNTER — Ambulatory Visit: Payer: Self-pay | Admitting: Family Medicine

## 2018-02-18 DIAGNOSIS — R600 Localized edema: Secondary | ICD-10-CM | POA: Diagnosis not present

## 2018-02-18 DIAGNOSIS — I480 Paroxysmal atrial fibrillation: Secondary | ICD-10-CM | POA: Diagnosis not present

## 2018-02-18 DIAGNOSIS — E782 Mixed hyperlipidemia: Secondary | ICD-10-CM | POA: Diagnosis not present

## 2018-02-18 DIAGNOSIS — I340 Nonrheumatic mitral (valve) insufficiency: Secondary | ICD-10-CM | POA: Diagnosis not present

## 2018-02-18 DIAGNOSIS — I493 Ventricular premature depolarization: Secondary | ICD-10-CM | POA: Diagnosis not present

## 2018-03-05 ENCOUNTER — Other Ambulatory Visit: Payer: Self-pay | Admitting: Family Medicine

## 2018-03-27 DIAGNOSIS — L820 Inflamed seborrheic keratosis: Secondary | ICD-10-CM | POA: Diagnosis not present

## 2018-03-27 DIAGNOSIS — C44622 Squamous cell carcinoma of skin of right upper limb, including shoulder: Secondary | ICD-10-CM | POA: Diagnosis not present

## 2018-03-27 DIAGNOSIS — Z85828 Personal history of other malignant neoplasm of skin: Secondary | ICD-10-CM | POA: Diagnosis not present

## 2018-03-27 DIAGNOSIS — L570 Actinic keratosis: Secondary | ICD-10-CM | POA: Diagnosis not present

## 2018-03-27 DIAGNOSIS — L821 Other seborrheic keratosis: Secondary | ICD-10-CM | POA: Diagnosis not present

## 2018-03-27 DIAGNOSIS — D225 Melanocytic nevi of trunk: Secondary | ICD-10-CM | POA: Diagnosis not present

## 2018-03-28 DIAGNOSIS — H524 Presbyopia: Secondary | ICD-10-CM | POA: Diagnosis not present

## 2018-03-28 DIAGNOSIS — H353232 Exudative age-related macular degeneration, bilateral, with inactive choroidal neovascularization: Secondary | ICD-10-CM | POA: Diagnosis not present

## 2018-04-14 DIAGNOSIS — H353211 Exudative age-related macular degeneration, right eye, with active choroidal neovascularization: Secondary | ICD-10-CM | POA: Diagnosis not present

## 2018-05-15 DIAGNOSIS — R001 Bradycardia, unspecified: Secondary | ICD-10-CM | POA: Diagnosis not present

## 2018-05-15 DIAGNOSIS — I340 Nonrheumatic mitral (valve) insufficiency: Secondary | ICD-10-CM | POA: Diagnosis not present

## 2018-05-15 DIAGNOSIS — E782 Mixed hyperlipidemia: Secondary | ICD-10-CM | POA: Diagnosis not present

## 2018-05-15 DIAGNOSIS — I480 Paroxysmal atrial fibrillation: Secondary | ICD-10-CM | POA: Diagnosis not present

## 2018-05-15 DIAGNOSIS — Z5181 Encounter for therapeutic drug level monitoring: Secondary | ICD-10-CM | POA: Diagnosis not present

## 2018-06-11 DIAGNOSIS — H3530 Unspecified macular degeneration: Secondary | ICD-10-CM | POA: Diagnosis not present

## 2018-06-11 DIAGNOSIS — H542X11 Low vision right eye category 1, low vision left eye category 1: Secondary | ICD-10-CM | POA: Diagnosis not present

## 2018-06-11 DIAGNOSIS — H53413 Scotoma involving central area, bilateral: Secondary | ICD-10-CM | POA: Diagnosis not present

## 2018-06-22 DIAGNOSIS — H353211 Exudative age-related macular degeneration, right eye, with active choroidal neovascularization: Secondary | ICD-10-CM | POA: Diagnosis not present

## 2018-06-27 DIAGNOSIS — H53413 Scotoma involving central area, bilateral: Secondary | ICD-10-CM | POA: Diagnosis not present

## 2018-07-14 ENCOUNTER — Other Ambulatory Visit: Payer: Self-pay | Admitting: Obstetrics & Gynecology

## 2018-07-14 DIAGNOSIS — Z1231 Encounter for screening mammogram for malignant neoplasm of breast: Secondary | ICD-10-CM

## 2018-07-25 DIAGNOSIS — H53413 Scotoma involving central area, bilateral: Secondary | ICD-10-CM | POA: Diagnosis not present

## 2018-07-25 IMAGING — MG DIGITAL SCREENING BILATERAL MAMMOGRAM WITH TOMO AND CAD
8 series · 9 of 24 positions shown · non-contrast
Comparison: Previous exam(s).

CLINICAL DATA: Screening.

EXAM:
DIGITAL SCREENING BILATERAL MAMMOGRAM WITH TOMO AND CAD

[R MLO synth-2D]
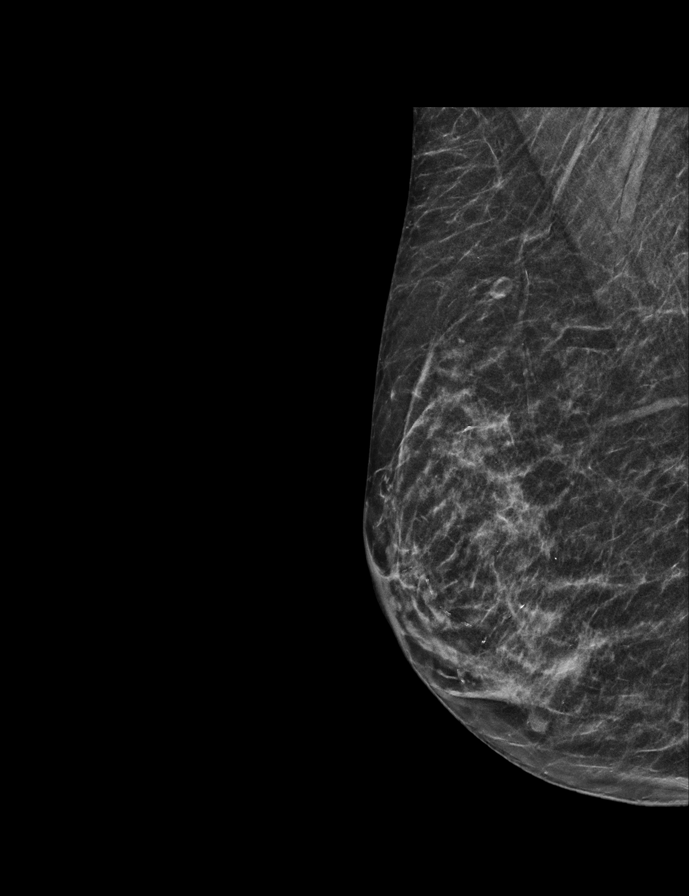

[R CC synth-2D]
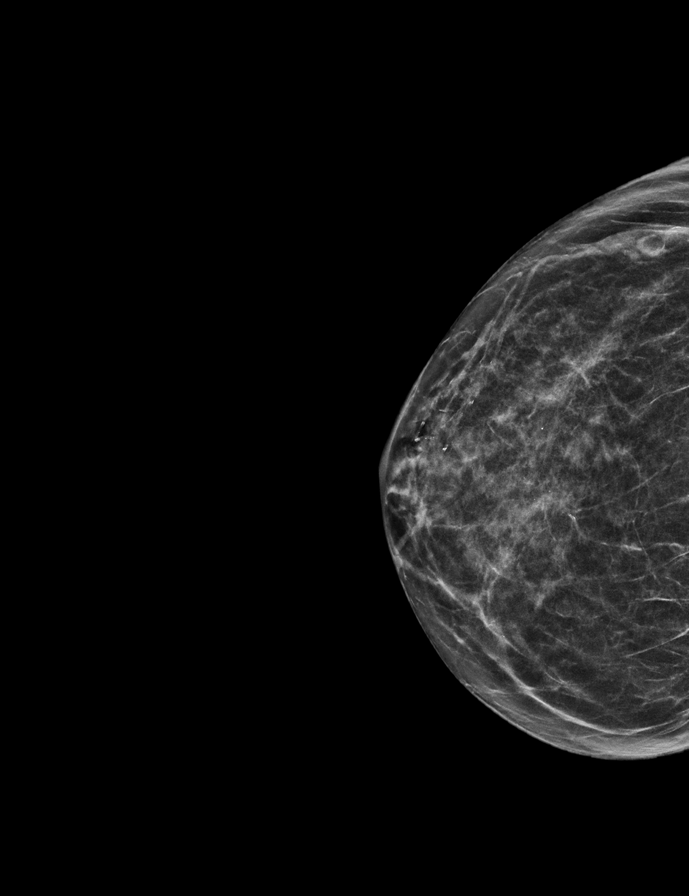

[L CC synth-2D]
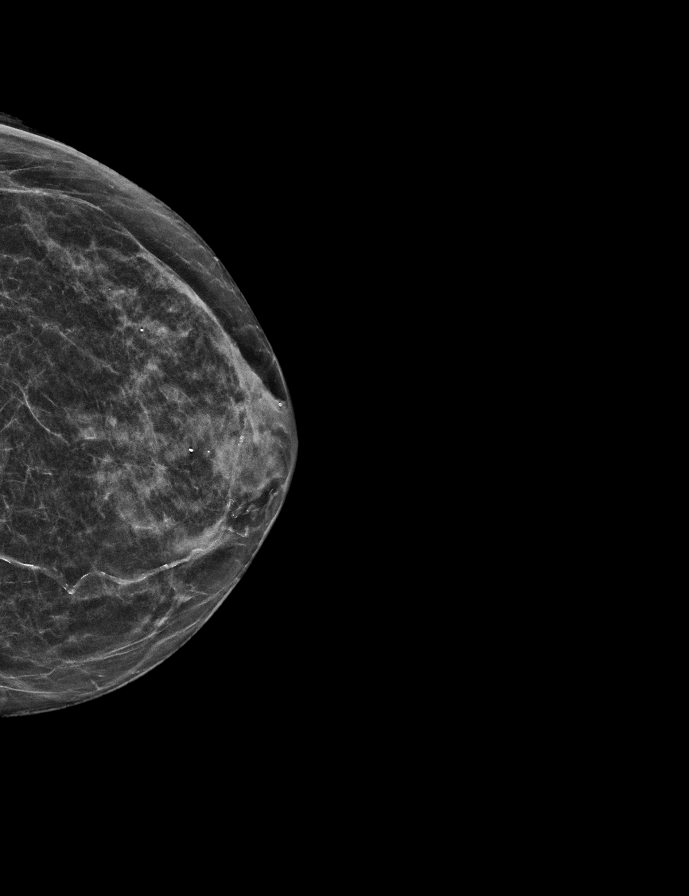

[L MLO synth-2D]
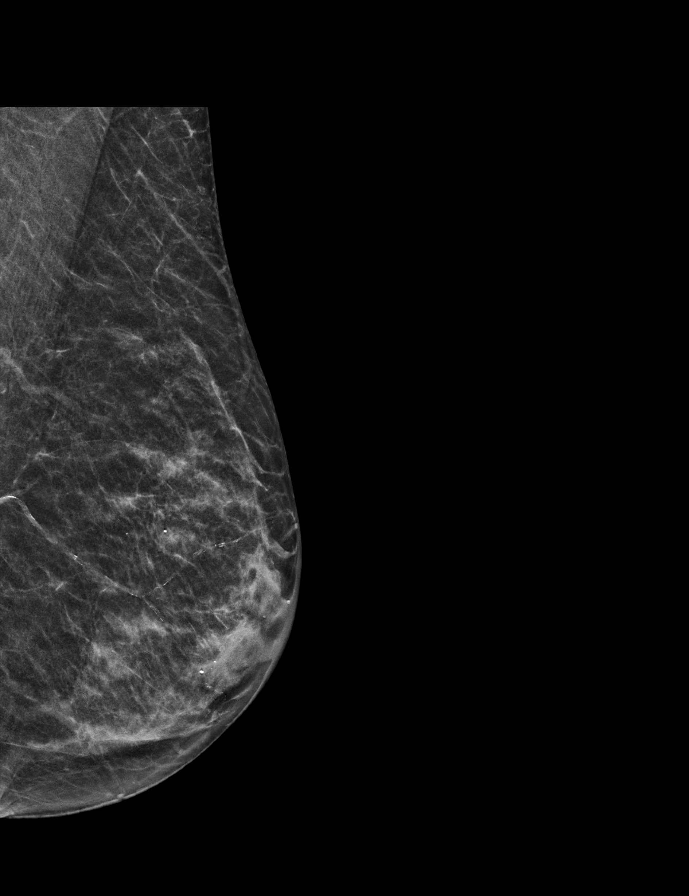

[L CC tomo · 2 of 44 frames shown]
[frame 15/44]
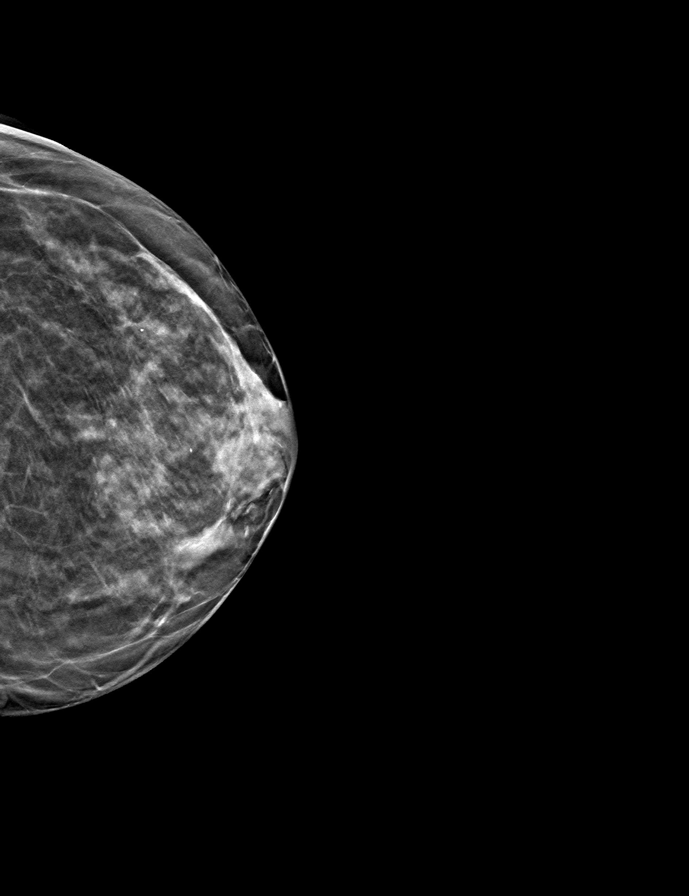
[frame 23/44]
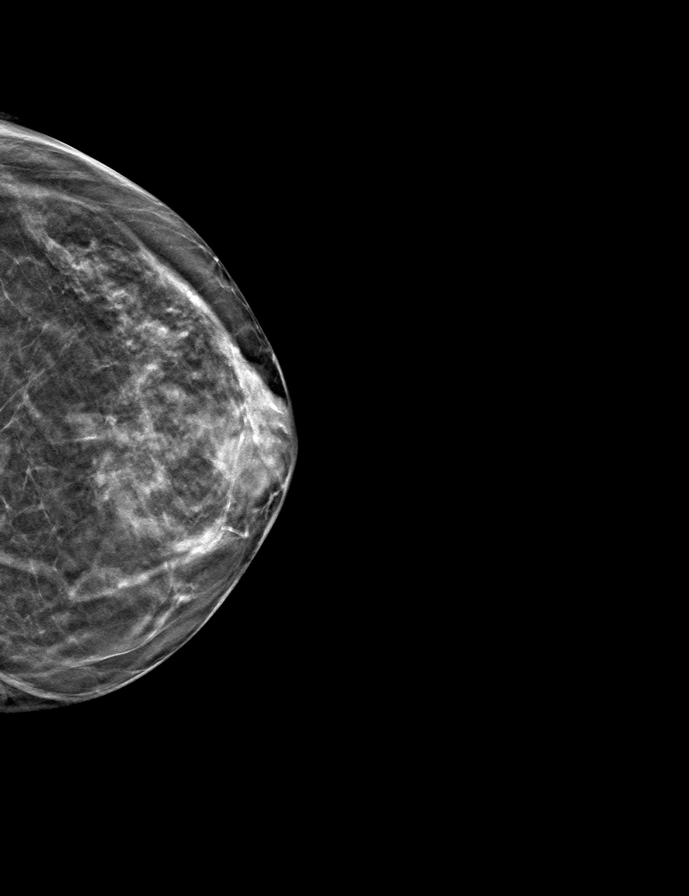

[R MLO tomo · tomo slice 21/41.0]
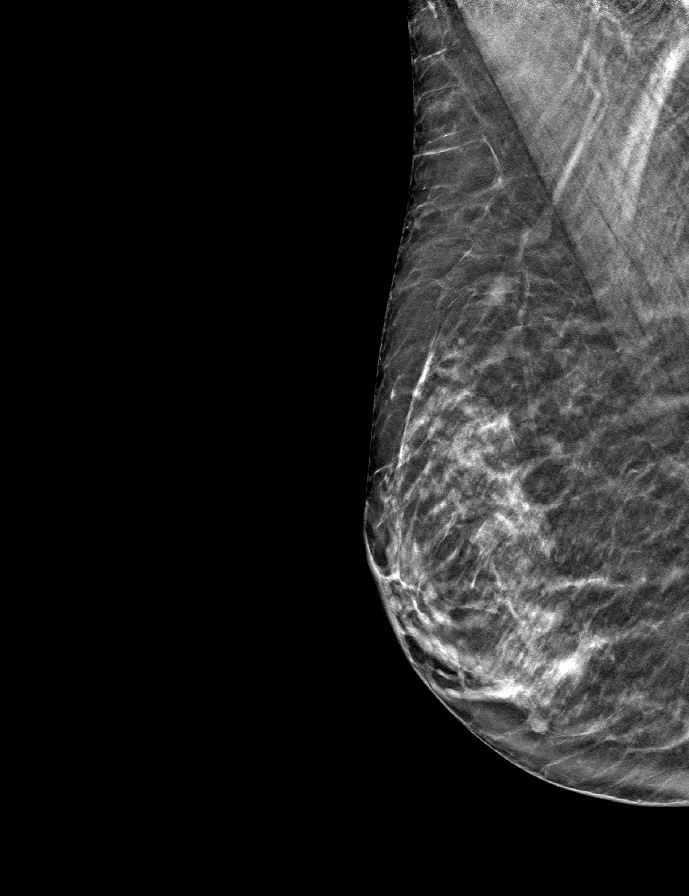

[L MLO tomo · tomo slice 21/41.0]
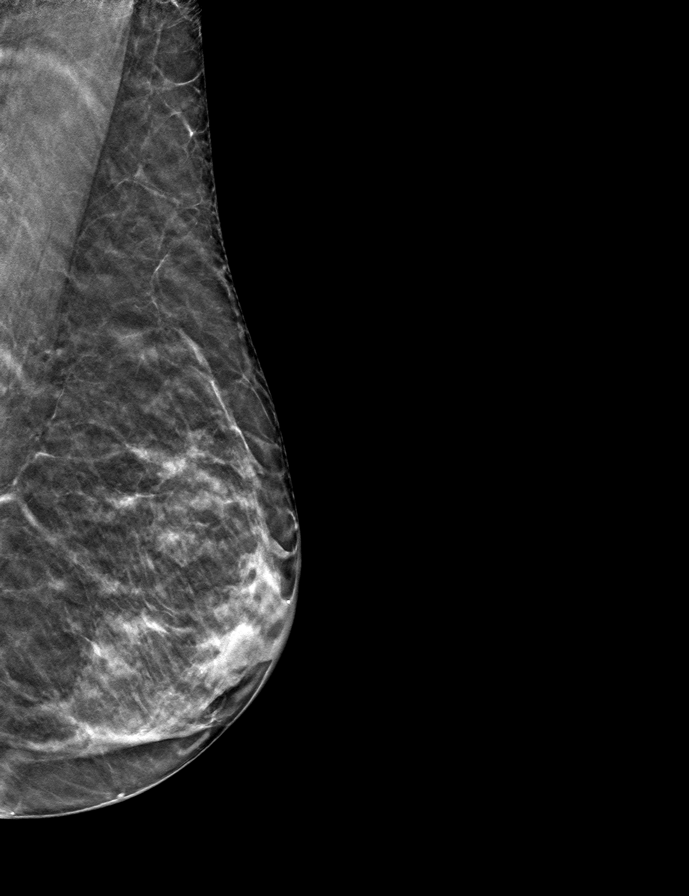

[R CC tomo · tomo slice 23/45.0]
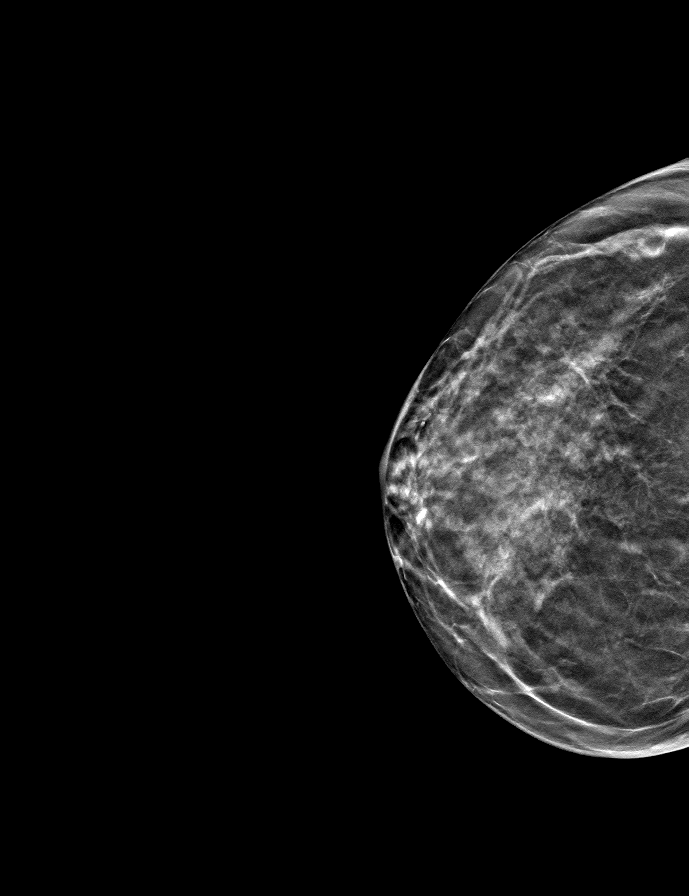

[9 of 24 positions shown; findings below may reference images not displayed]

ACR Breast Density Category c: The breast tissue is heterogeneously
dense, which may obscure small masses.
FINDINGS: In the left breast, a possible asymmetry warrants further
evaluation. In the right breast, no findings suspicious for
malignancy. Images were processed with CAD.
IMPRESSION: Further evaluation is suggested for possible asymmetry in the left
breast.

RECOMMENDATION:
Diagnostic mammogram and possibly ultrasound of the left breast.
(Code:[id number])

The patient will be contacted regarding the findings, and additional
imaging will be scheduled.

BI-RADS CATEGORY  0: Incomplete. Need additional imaging evaluation
and/or prior mammograms for comparison.

## 2018-08-04 ENCOUNTER — Telehealth: Payer: Self-pay | Admitting: Family Medicine

## 2018-08-04 NOTE — Telephone Encounter (Signed)
What she is describing is a cold.  Antibiotics do not work against viruses.  This is almost certainly viral.  If she gets sicker she needs to let us know.  Rest fluids Tylenol and Robitussin

## 2018-08-04 NOTE — Telephone Encounter (Signed)
Spoke to pt and she advised that this is going on for a while (2 weeks) and is having drainage in throat and sinus pressure in head. And she feels it is a sinus infection. Pt is also having stuffiness in her right ear and is tender.  Pt is still asking for the doxycycline.

## 2018-08-04 NOTE — Telephone Encounter (Signed)
Pt is having stuffy and runny nose - off and on.  She thinks walking her dog in the weather has caused this.  She can no longer drive to an appt.  Asking if doxycycline (VIBRA-TABS) 100 MG tablet can be called into:  [facility], [place] [phone] (Phone) [phone] (Fax)   Please advise.  Thanks, [employer]

## 2018-08-04 NOTE — Telephone Encounter (Signed)
Please review. Thanks!  

## 2018-08-05 NOTE — Telephone Encounter (Signed)
LMTCB ED 

## 2018-08-05 NOTE — Telephone Encounter (Signed)
I think an appointment to see Dr. B would be most appropriate.  My schedule full this week.  Not a good idea to call in antibiotics without seeing patient

## 2018-08-05 NOTE — Telephone Encounter (Signed)
Patient was advised.  

## 2018-08-15 DIAGNOSIS — J301 Allergic rhinitis due to pollen: Secondary | ICD-10-CM | POA: Diagnosis not present

## 2018-08-18 ENCOUNTER — Ambulatory Visit: Payer: Medicare Other

## 2018-08-25 ENCOUNTER — Encounter: Payer: Self-pay | Admitting: Physician Assistant

## 2018-08-25 ENCOUNTER — Other Ambulatory Visit: Payer: Self-pay

## 2018-08-25 ENCOUNTER — Ambulatory Visit (INDEPENDENT_AMBULATORY_CARE_PROVIDER_SITE_OTHER): Payer: Medicare Other | Admitting: Physician Assistant

## 2018-08-25 VITALS — BP 125/74 | HR 59 | Temp 98.2°F | Resp 16 | Wt 122.8 lb

## 2018-08-25 DIAGNOSIS — J0110 Acute frontal sinusitis, unspecified: Secondary | ICD-10-CM

## 2018-08-25 MED ORDER — DOXYCYCLINE HYCLATE 100 MG PO TABS: 100.0000 mg | ORAL_TABLET | Freq: Two times a day (BID) | ORAL | 0 refills | Status: AC

## 2018-08-25 NOTE — Patient Instructions (Signed)

## 2018-08-25 NOTE — Progress Notes (Signed)
Patient: Darlene Serrano Female    DOB: 07-Oct-1934   84 y.o.   MRN: [id number] Visit Date: 08/25/2018  Today's Provider: Trinna Post, PA-C   Chief Complaint  Patient presents with  . URI   Subjective:     URI   This is a new problem. The current episode started more than 1 month ago (Allergies and on Saturday she reports that she couldn't breath. Reports that she was taken to the [facility] in clinic and was prescribed nasal spray and it helped but feels like her sinuses are no doing well). The problem has been gradually worsening. There has been no fever. Associated symptoms include congestion, coughing and a sore throat ("left side from the drainage"). Pertinent negatives include no chest pain, headaches or wheezing. Treatments tried: Flonase and Claritin. The treatment provided no relief.    Reports ongoing allergies x 1 mo, has been taking Claritin and flonase daily but has had recent left sided worsening of facial pain.   Allergies  Allergen Reactions  . Penicillins Swelling  . Codeine Itching     Current Outpatient Medications:  .  ALLERGY 10 MG tablet, TAKE 1 TABLET BY MOUTH DAILY, Disp: 90 tablet, Rfl: 3 .  atorvastatin (LIPITOR) 10 MG tablet, Take 1 tablet (10 mg total) by mouth every morning., Disp: 30 tablet, Rfl: 12 .  calcium carbonate (OS-CAL) 600 MG TABS tablet, Take 600 mg by mouth daily. , Disp: , Rfl:  .  ELIQUIS 5 MG TABS tablet, , Disp: , Rfl:  .  flecainide (TAMBOCOR) 50 MG tablet, Take by mouth., Disp: , Rfl:  .  fluticasone (FLONASE) 50 MCG/ACT nasal spray, Place into the nose., Disp: , Rfl:  .  metoprolol succinate (TOPROL-XL) 25 MG 24 hr tablet, Take by mouth., Disp: , Rfl:  .  Multiple Vitamins-Minerals (ICAPS PO), Take 1 tablet by mouth 2 (two) times daily. , Disp: , Rfl:  .  cholecalciferol (VITAMIN D) 1000 UNITS tablet, VITAMIN D, 1000UNIT (Oral Tablet)  1 Every Day for 0 days  Quantity: 0.00;  Refills: 0   Ordered :01-May-2010  [name] ;   Started 13-Apr-2009 Active, Disp: , Rfl:  .  traZODone (DESYREL) 50 MG tablet, Take 1 tablet (50 mg total) by mouth at bedtime as needed for sleep. (Patient not taking: Reported on 12/15/2017), Disp: 90 tablet, Rfl: 3  Review of Systems  Constitutional: Negative for fever.  HENT: Positive for congestion, postnasal drip, sinus pressure, sore throat ("left side from the drainage") and voice change.   Respiratory: Positive for cough. Negative for chest tightness, shortness of breath and wheezing.   Cardiovascular: Negative for chest pain, palpitations and leg swelling.  Neurological: Negative for headaches.    Social History   Tobacco Use  . Smoking status: Never Smoker  . Smokeless tobacco: Never Used  Substance Use Topics  . Alcohol use: No      Objective:   BP 125/74 (BP Location: Left Arm, Patient Position: Sitting, Cuff Size: Normal)   Pulse (!) 59   Temp 98.2 F (36.8 C) (Oral)   Resp 16   Wt 122 lb 12.8 oz (55.7 kg)   BMI 20.43 kg/m  Vitals:   08/25/18 1020  BP: 125/74  Pulse: (!) 59  Resp: 16  Temp: 98.2 F (36.8 C)  TempSrc: Oral  Weight: 122 lb 12.8 oz (55.7 kg)     Physical Exam Constitutional:      General: She is not in  acute distress.    Appearance: Normal appearance. She is well-developed. She is not diaphoretic.  HENT:     Right Ear: External ear normal.     Left Ear: External ear normal.     Nose:     Right Sinus: Maxillary sinus tenderness and frontal sinus tenderness present.     Left Sinus: Maxillary sinus tenderness and frontal sinus tenderness present.     Mouth/Throat:     Pharynx: No oropharyngeal exudate or posterior oropharyngeal erythema.  Eyes:     General:        Right eye: No discharge.        Left eye: No discharge.     Conjunctiva/sclera: Conjunctivae normal.  Neck:     Musculoskeletal: Neck supple.  Cardiovascular:     Rate and Rhythm: Normal rate and regular rhythm.  Pulmonary:     Effort: Pulmonary effort is normal.      Breath sounds: Normal breath sounds.  Lymphadenopathy:     Cervical: Cervical adenopathy present.  Skin:    General: Skin is warm and dry.  Neurological:     Mental Status: She is alert and oriented to person, place, and time.  Psychiatric:        Behavior: Behavior normal.         Assessment & Plan    1. Acute non-recurrent frontal sinusitis  - doxycycline (VIBRA-TABS) 100 MG tablet; Take 1 tablet (100 mg total) by mouth 2 (two) times daily for 10 days.  Dispense: 20 tablet; Refill: 0  The entirety of the information documented in the History of Present Illness, Review of Systems and Physical Exam were personally obtained by me. Portions of this information were initially documented by Lyndel Pleasure, CMA and reviewed by me for thoroughness and accuracy.   F/u PRN       Trinna Post, PA-C  [facility]

## 2018-10-13 DIAGNOSIS — H353211 Exudative age-related macular degeneration, right eye, with active choroidal neovascularization: Secondary | ICD-10-CM | POA: Diagnosis not present

## 2018-10-15 DIAGNOSIS — R002 Palpitations: Secondary | ICD-10-CM | POA: Diagnosis not present

## 2018-10-15 DIAGNOSIS — R0602 Shortness of breath: Secondary | ICD-10-CM | POA: Diagnosis not present

## 2018-10-15 DIAGNOSIS — I480 Paroxysmal atrial fibrillation: Secondary | ICD-10-CM | POA: Diagnosis not present

## 2018-11-02 DIAGNOSIS — E782 Mixed hyperlipidemia: Secondary | ICD-10-CM | POA: Diagnosis not present

## 2018-11-02 DIAGNOSIS — I480 Paroxysmal atrial fibrillation: Secondary | ICD-10-CM | POA: Diagnosis not present

## 2018-11-02 DIAGNOSIS — I340 Nonrheumatic mitral (valve) insufficiency: Secondary | ICD-10-CM | POA: Diagnosis not present

## 2018-11-13 DIAGNOSIS — Z85828 Personal history of other malignant neoplasm of skin: Secondary | ICD-10-CM | POA: Diagnosis not present

## 2018-11-13 DIAGNOSIS — L821 Other seborrheic keratosis: Secondary | ICD-10-CM | POA: Diagnosis not present

## 2018-11-13 DIAGNOSIS — L570 Actinic keratosis: Secondary | ICD-10-CM | POA: Diagnosis not present

## 2018-11-13 DIAGNOSIS — D1801 Hemangioma of skin and subcutaneous tissue: Secondary | ICD-10-CM | POA: Diagnosis not present

## 2018-11-26 ENCOUNTER — Encounter: Payer: Self-pay | Admitting: Obstetrics & Gynecology

## 2018-11-26 ENCOUNTER — Ambulatory Visit
Admission: RE | Admit: 2018-11-26 | Discharge: 2018-11-26 | Disposition: A | Discharge status: Home / Self Care | Payer: Medicare Other | Source: Ambulatory Visit | Attending: Obstetrics & Gynecology | Admitting: Obstetrics & Gynecology

## 2018-11-26 ENCOUNTER — Other Ambulatory Visit: Payer: Self-pay

## 2018-11-26 DIAGNOSIS — Z1231 Encounter for screening mammogram for malignant neoplasm of breast: Secondary | ICD-10-CM | POA: Insufficient documentation

## 2019-01-05 DIAGNOSIS — H353211 Exudative age-related macular degeneration, right eye, with active choroidal neovascularization: Secondary | ICD-10-CM | POA: Diagnosis not present

## 2019-01-06 ENCOUNTER — Telehealth: Payer: Self-pay | Admitting: Family Medicine

## 2019-01-06 DIAGNOSIS — J3089 Other allergic rhinitis: Secondary | ICD-10-CM

## 2019-01-06 NOTE — Telephone Encounter (Signed)
Please advise 

## 2019-01-06 NOTE — Telephone Encounter (Signed)
Pt needs something for allergies.  She wants a spray but says Flonase helps but gives her a sore throat after several days of use  CB#  [phone]    Darlene Serrano

## 2019-01-07 MED ORDER — MONTELUKAST SODIUM 10 MG PO TABS: 10.0000 mg | ORAL_TABLET | Freq: Every day | ORAL | 11 refills | Status: DC

## 2019-01-07 NOTE — Telephone Encounter (Signed)
OK to try Monteleukast daily --10 mg

## 2019-01-07 NOTE — Telephone Encounter (Signed)
Medication was sent into the pharmacy. Patient advised.

## 2019-01-11 ENCOUNTER — Telehealth: Payer: Self-pay | Admitting: Family Medicine

## 2019-01-11 NOTE — Telephone Encounter (Signed)
Appt scheduled with Dr. Rosanna Randy for telephone visit to discuss medications and sore throat.

## 2019-01-11 NOTE — Telephone Encounter (Signed)
Morning

## 2019-01-11 NOTE — Telephone Encounter (Signed)
Pt needing to know how to take the new medications for her allergies. Throat is raw.  Please call pt back asap and advise.  Thanks, [employer]

## 2019-01-11 NOTE — Telephone Encounter (Signed)
We just prescribed Singulair 10mg  daily. Does it matter if she takes this in the morning vs the evening? Please advise. Thanks!

## 2019-01-12 ENCOUNTER — Ambulatory Visit (INDEPENDENT_AMBULATORY_CARE_PROVIDER_SITE_OTHER): Payer: Medicare Other | Admitting: Family Medicine

## 2019-01-12 DIAGNOSIS — J018 Other acute sinusitis: Secondary | ICD-10-CM | POA: Diagnosis not present

## 2019-01-12 DIAGNOSIS — J3089 Other allergic rhinitis: Secondary | ICD-10-CM | POA: Diagnosis not present

## 2019-01-12 MED ORDER — DOXYCYCLINE HYCLATE 100 MG PO TABS: 100.0000 mg | ORAL_TABLET | Freq: Two times a day (BID) | ORAL | 0 refills | Status: DC

## 2019-01-12 NOTE — Progress Notes (Signed)
Virtual Visit via Telephone Note  I connected with Darlene Serrano on 01/12/19 at  8:40 AM EDT by telephone and verified that I am speaking with the correct person using two identifiers.  Location: Patient: Home Provider: Office   I discussed the limitations, risks, security and privacy concerns of performing an evaluation and management service by telephone and the availability of in person appointments. I also discussed with the patient that there may be a patient responsible charge related to this service. The patient expressed understanding and agreed to proceed.   History of Present Illness: Very nice lady with chronic AR and anxiety c/o chronic post nasal drainage since feb which is a little thicker and now she has a mild frontal headache. No fevers or known covid exposure.   Observations/Objective: none  Assessment and Plan: 1. Acute non-recurrent sinusitis of other sinus Pt did well on doxy so will repeat this.  f/u prn 2. Seasonal allergic rhinitis due to other allergic trigger Start singulair today. 3.Chronic Anxiety Pt safe as she lives at [facility] and no longer drives.  Follow Up Instructions:    I discussed the assessment and treatment plan with the patient. The patient was provided an opportunity to ask questions and all were answered. The patient agreed with the plan and demonstrated an understanding of the instructions.   The patient was advised to call back or seek an in-person evaluation if the symptoms worsen or if the condition fails to improve as anticipated.  I provided  10 minutes of non-face-to-face time during this encounter.   Wilhemena Durie, MD

## 2019-01-20 ENCOUNTER — Telehealth: Payer: Self-pay

## 2019-01-20 NOTE — Telephone Encounter (Signed)
Please advise. Thanks.  

## 2019-01-20 NOTE — Telephone Encounter (Signed)
appt scheduled for 11am

## 2019-01-20 NOTE — Telephone Encounter (Signed)
How about tomorrow at 840 or 11?

## 2019-01-20 NOTE — Telephone Encounter (Signed)
Patient called requesting an appointment to see Dr. Rosanna Randy. She believes she has a flare up of Diverticulosis. Patient has had this in the past. She has abdominal pain in the LLQ. The pain is crampy to sharp. Patient is currently being treated for sinus infection after a telephone visit with Dr. Rosanna Randy on 01/12/2019. Patient has some PND. Patient would liketo be seen in the office by Dr. Rosanna Randy for evaluation of abdominal pain due to possible diverticulosis. Please advise if ok to schedule appointment in office or whether this visit needs to be virtual.

## 2019-01-21 ENCOUNTER — Ambulatory Visit (INDEPENDENT_AMBULATORY_CARE_PROVIDER_SITE_OTHER): Payer: Medicare Other | Admitting: Family Medicine

## 2019-01-21 ENCOUNTER — Other Ambulatory Visit: Payer: Self-pay

## 2019-01-21 ENCOUNTER — Encounter: Payer: Self-pay | Admitting: Family Medicine

## 2019-01-21 VITALS — BP 108/70 | HR 60 | Temp 98.6°F | Resp 16 | Wt 119.0 lb

## 2019-01-21 DIAGNOSIS — R1084 Generalized abdominal pain: Secondary | ICD-10-CM

## 2019-01-21 DIAGNOSIS — F419 Anxiety disorder, unspecified: Secondary | ICD-10-CM

## 2019-01-21 DIAGNOSIS — K5792 Diverticulitis of intestine, part unspecified, without perforation or abscess without bleeding: Secondary | ICD-10-CM

## 2019-01-21 LAB — POCT URINALYSIS DIPSTICK
Bilirubin, UA: NEGATIVE
Glucose, UA: NEGATIVE
Ketones, UA: NEGATIVE
Leukocytes, UA: NEGATIVE
Nitrite, UA: NEGATIVE
Protein, UA: NEGATIVE
Spec Grav, UA: 1.01 (ref 1.010–1.025)
Urobilinogen, UA: 0.2 E.U./dL
pH, UA: 6 (ref 5.0–8.0)

## 2019-01-21 MED ORDER — DOXYCYCLINE HYCLATE 100 MG PO TABS: 100.0000 mg | ORAL_TABLET | Freq: Two times a day (BID) | ORAL | 0 refills | Status: AC

## 2019-01-21 NOTE — Patient Instructions (Signed)
Please get your labs drawn at this address:  [address], [place], [place] [address]

## 2019-01-21 NOTE — Progress Notes (Signed)
Patient: Darlene Serrano Female    DOB: [birth year]   84 y.o.   MRN: [id number] Visit Date: 01/21/2019  Today's Provider: Wilhemena Durie, MD   Chief Complaint  Patient presents with  . Abdominal Pain   Subjective:    HPI Patient comes in today c/o abdominal pain that has been ongoing for about 1 week. She denies any changes in her bowels. She does have history of diverticulosis, and feels it could be another flare up. Her pain starts in the lower abdominal area, and radiates down her pelvic area.   Allergies  Allergen Reactions  . Penicillins Swelling  . Codeine Itching     Current Outpatient Medications:  .  ALLERGY 10 MG tablet, TAKE 1 TABLET BY MOUTH DAILY, Disp: 90 tablet, Rfl: 3 .  atorvastatin (LIPITOR) 10 MG tablet, Take 1 tablet (10 mg total) by mouth every morning., Disp: 30 tablet, Rfl: 12 .  calcium carbonate (OS-CAL) 600 MG TABS tablet, Take 600 mg by mouth daily. , Disp: , Rfl:  .  cholecalciferol (VITAMIN D) 1000 UNITS tablet, VITAMIN D, 1000UNIT (Oral Tablet)  1 Every Day for 0 days  Quantity: 0.00;  Refills: 0   Ordered :01-May-2010  [name] ;  Started 13-Apr-2009 Active, Disp: , Rfl:  .  doxycycline (VIBRA-TABS) 100 MG tablet, Take 1 tablet (100 mg total) by mouth 2 (two) times daily., Disp: 20 tablet, Rfl: 0 .  ELIQUIS 5 MG TABS tablet, , Disp: , Rfl:  .  fluticasone (FLONASE) 50 MCG/ACT nasal spray, Place into the nose., Disp: , Rfl:  .  montelukast (SINGULAIR) 10 MG tablet, Take 1 tablet (10 mg total) by mouth at bedtime., Disp: 30 tablet, Rfl: 11 .  Multiple Vitamins-Minerals (ICAPS PO), Take 1 tablet by mouth 2 (two) times daily. , Disp: , Rfl:  .  flecainide (TAMBOCOR) 50 MG tablet, Take by mouth., Disp: , Rfl:  .  metoprolol succinate (TOPROL-XL) 25 MG 24 hr tablet, Take by mouth., Disp: , Rfl:  .  traZODone (DESYREL) 50 MG tablet, Take 1 tablet (50 mg total) by mouth at bedtime as needed for sleep. (Patient not taking: Reported on  12/15/2017), Disp: 90 tablet, Rfl: 3  Review of Systems  Constitutional: Negative for activity change, fatigue and fever.  HENT: Positive for congestion.   Respiratory: Negative.   Cardiovascular: Negative.   Gastrointestinal: Positive for abdominal pain. Negative for constipation, diarrhea, nausea, rectal pain and vomiting.  Endocrine: Negative.   Genitourinary: Negative.   Musculoskeletal: Positive for myalgias. Negative for arthralgias.  Allergic/Immunologic: Positive for environmental allergies.  Neurological: Negative for dizziness and headaches.  Psychiatric/Behavioral: The patient is nervous/anxious.     Social History   Tobacco Use  . Smoking status: Never Smoker  . Smokeless tobacco: Never Used  Substance Use Topics  . Alcohol use: No      Objective:   BP 108/70   Pulse 60   Temp 98.6 F (37 C)   Resp 16   Wt 119 lb (54 kg)   SpO2 98%   BMI 19.80 kg/m  Vitals:   01/21/19 1104  BP: 108/70  Pulse: 60  Resp: 16  Temp: 98.6 F (37 C)  SpO2: 98%  Weight: 119 lb (54 kg)     Physical Exam Constitutional:      Appearance: She is well-developed.  HENT:     Head: Normocephalic and atraumatic.     Right Ear: Hearing and tympanic membrane normal.  Eyes:     Pupils: Pupils are equal, round, and reactive to light.  Neck:     Musculoskeletal: Normal range of motion and neck supple.  Cardiovascular:     Rate and Rhythm: Normal rate and regular rhythm.  Pulmonary:     Effort: Pulmonary effort is normal.  Abdominal:     General: Abdomen is flat. Bowel sounds are normal.     Palpations: Abdomen is soft.     Comments: Very mild LLQ >RLQ tenderness without any rebound or guarding.   Skin:    General: Skin is warm.  Neurological:     Mental Status: She is alert.   UA Trace blood and leukocytes.   No results found for any visits on 01/21/19.     Assessment & Plan    1. Generalized abdominal pain Pt looks very well today. Finish Doxy as I think she is  improving. - CBC with Differential/Platelet - Comprehensive metabolic panel - doxycycline (VIBRA-TABS) 100 MG tablet; Take 1 tablet (100 mg total) by mouth 2 (two) times daily for 7 days.  Dispense: 14 tablet; Refill: 0 - Urine Culture - POCT urinalysis dipstick  2. Diverticulitis Could be diverticulitis and may need GI referral.     Wilhemena Durie, MD  [facility]

## 2019-01-22 LAB — COMPREHENSIVE METABOLIC PANEL
ALT: 16 IU/L (ref 0–32)
AST: 22 IU/L (ref 0–40)
Albumin/Globulin Ratio: 2.6 — ABNORMAL HIGH (ref 1.2–2.2)
Albumin: 4.4 g/dL (ref 3.6–4.6)
Alkaline Phosphatase: 72 IU/L (ref 39–117)
BUN/Creatinine Ratio: 20 (ref 12–28)
BUN: 15 mg/dL (ref 8–27)
Bilirubin Total: 0.6 mg/dL (ref 0.0–1.2)
CO2: 25 mmol/L (ref 20–29)
Calcium: 9.5 mg/dL (ref 8.7–10.3)
Chloride: 100 mmol/L (ref 96–106)
Creatinine, Ser: 0.75 mg/dL (ref 0.57–1.00)
GFR calc Af Amer: 86 mL/min/{1.73_m2} (ref >59)
GFR calc non Af Amer: 75 mL/min/{1.73_m2} (ref >59)
Globulin, Total: 1.7 g/dL (ref 1.5–4.5)
Glucose: 91 mg/dL (ref 65–99)
Potassium: 4.8 mmol/L (ref 3.5–5.2)
Sodium: 139 mmol/L (ref 134–144)
Total Protein: 6.1 g/dL (ref 6.0–8.5)

## 2019-01-22 LAB — CBC WITH DIFFERENTIAL/PLATELET
Basophils Absolute: 0.1 10*3/uL (ref 0.0–0.2)
Basos: 1 %
EOS (ABSOLUTE): 0.2 10*3/uL (ref 0.0–0.4)
Eos: 3 %
Hematocrit: 36 % (ref 34.0–46.6)
Hemoglobin: 12 g/dL (ref 11.1–15.9)
Immature Grans (Abs): 0 10*3/uL (ref 0.0–0.1)
Immature Granulocytes: 0 %
Lymphocytes Absolute: 2.1 10*3/uL (ref 0.7–3.1)
Lymphs: 33 %
MCH: 31.5 pg (ref 26.6–33.0)
MCHC: 33.3 g/dL (ref 31.5–35.7)
MCV: 95 fL (ref 79–97)
Monocytes Absolute: 0.5 10*3/uL (ref 0.1–0.9)
Monocytes: 8 %
Neutrophils Absolute: 3.6 10*3/uL (ref 1.4–7.0)
Neutrophils: 55 %
Platelets: 202 10*3/uL (ref 150–450)
RBC: 3.81 x10E6/uL (ref 3.77–5.28)
RDW: 12.2 % (ref 11.7–15.4)
WBC: 6.4 10*3/uL (ref 3.4–10.8)

## 2019-01-23 LAB — URINE CULTURE: Organism ID, Bacteria: NO GROWTH

## 2019-01-26 ENCOUNTER — Telehealth: Payer: Self-pay

## 2019-01-26 NOTE — Telephone Encounter (Signed)
LMTCB

## 2019-01-26 NOTE — Telephone Encounter (Signed)
-----   Message from Jerrol Banana., MD sent at 01/23/2019  6:57 AM EDT ----- Urine normal.

## 2019-01-26 NOTE — Telephone Encounter (Signed)
Advised patient of results.  

## 2019-02-09 ENCOUNTER — Other Ambulatory Visit: Payer: Self-pay

## 2019-02-09 ENCOUNTER — Ambulatory Visit (INDEPENDENT_AMBULATORY_CARE_PROVIDER_SITE_OTHER): Payer: Medicare Other | Admitting: Family Medicine

## 2019-02-09 DIAGNOSIS — Z23 Encounter for immunization: Secondary | ICD-10-CM

## 2019-02-11 NOTE — Progress Notes (Signed)
Vaccine only

## 2019-03-24 ENCOUNTER — Other Ambulatory Visit: Payer: Self-pay

## 2019-03-24 ENCOUNTER — Ambulatory Visit (INDEPENDENT_AMBULATORY_CARE_PROVIDER_SITE_OTHER): Payer: Medicare Other | Admitting: Family Medicine

## 2019-03-24 ENCOUNTER — Encounter: Payer: Self-pay | Admitting: Family Medicine

## 2019-03-24 VITALS — BP 121/70 | HR 54 | Temp 96.9°F | Resp 18 | Ht 65.0 in | Wt 119.0 lb

## 2019-03-24 DIAGNOSIS — N3001 Acute cystitis with hematuria: Secondary | ICD-10-CM

## 2019-03-24 DIAGNOSIS — R300 Dysuria: Secondary | ICD-10-CM | POA: Diagnosis not present

## 2019-03-24 DIAGNOSIS — F419 Anxiety disorder, unspecified: Secondary | ICD-10-CM

## 2019-03-24 LAB — POCT URINALYSIS DIPSTICK
Appearance: ABNORMAL
Bilirubin, UA: NEGATIVE
Glucose, UA: NEGATIVE
Nitrite, UA: NEGATIVE
Odor: NORMAL
Protein, UA: POSITIVE — AB
Spec Grav, UA: 1.01 (ref 1.010–1.025)
Urobilinogen, UA: 0.2 E.U./dL
pH, UA: 6 (ref 5.0–8.0)

## 2019-03-24 MED ORDER — PHENAZOPYRIDINE HCL 95 MG PO TABS: 95.0000 mg | ORAL_TABLET | Freq: Three times a day (TID) | ORAL | 3 refills | Status: DC | PRN

## 2019-03-24 MED ORDER — NITROFURANTOIN MONOHYD MACRO 100 MG PO CAPS: 100.0000 mg | ORAL_CAPSULE | Freq: Two times a day (BID) | ORAL | 3 refills | Status: DC

## 2019-03-24 NOTE — Progress Notes (Signed)
Patient: Darlene Serrano Female    DOB: 1934/10/09   84 y.o.   MRN: [id number] Visit Date: 03/24/2019  Today's Provider: Wilhemena Durie, MD   Chief Complaint  Patient presents with  . Urinary Frequency   Subjective:     Urinary Frequency  This is a new problem. Episode onset: 2 days. The problem occurs every urination. The quality of the pain is described as burning. The pain is mild. There has been no fever. Associated symptoms include flank pain, frequency and urgency. Pertinent negatives include no chills, discharge, hematuria, hesitancy, nausea, possible pregnancy, sweats or vomiting. She has tried acetaminophen for the symptoms. The treatment provided mild relief.   Patient has had urinary frequency and urgency for 2 days. Patient states she also has low abdominal pain and slight burning upon urination. Patient states she hs been soaking in warm baths and taking tylenol with mild relief.   Allergies  Allergen Reactions  . Penicillins Swelling  . Codeine Itching     Current Outpatient Medications:  .  atorvastatin (LIPITOR) 10 MG tablet, Take 1 tablet (10 mg total) by mouth every morning., Disp: 30 tablet, Rfl: 12 .  calcium carbonate (OS-CAL) 600 MG TABS tablet, Take 600 mg by mouth daily. , Disp: , Rfl:  .  cholecalciferol (VITAMIN D) 1000 UNITS tablet, VITAMIN D, 1000UNIT (Oral Tablet)  1 Every Day for 0 days  Quantity: 0.00;  Refills: 0   Ordered :01-May-2010  [name] ;  Started 13-Apr-2009 Active, Disp: , Rfl:  .  ELIQUIS 5 MG TABS tablet, , Disp: , Rfl:  .  Fexofenadine HCl (ALLEGRA PO), Take by mouth., Disp: , Rfl:  .  flecainide (TAMBOCOR) 50 MG tablet, Take by mouth., Disp: , Rfl:  .  furosemide (LASIX) 20 MG tablet, TAKE ONE TABLET EVERY OTHER DAY, Disp: , Rfl:  .  metoprolol succinate (TOPROL-XL) 25 MG 24 hr tablet, Take by mouth., Disp: , Rfl:  .  Multiple Vitamins-Minerals (ICAPS PO), Take 1 tablet by mouth 2 (two) times daily. , Disp: , Rfl:  .   ALLERGY 10 MG tablet, TAKE 1 TABLET BY MOUTH DAILY (Patient not taking: Reported on 03/24/2019), Disp: 90 tablet, Rfl: 3 .  doxycycline (VIBRA-TABS) 100 MG tablet, Take 1 tablet (100 mg total) by mouth 2 (two) times daily. (Patient not taking: Reported on 03/24/2019), Disp: 20 tablet, Rfl: 0 .  fluticasone (FLONASE) 50 MCG/ACT nasal spray, Place into the nose., Disp: , Rfl:  .  montelukast (SINGULAIR) 10 MG tablet, Take 1 tablet (10 mg total) by mouth at bedtime. (Patient not taking: Reported on 03/24/2019), Disp: 30 tablet, Rfl: 11 .  traZODone (DESYREL) 50 MG tablet, Take 1 tablet (50 mg total) by mouth at bedtime as needed for sleep. (Patient not taking: Reported on 12/15/2017), Disp: 90 tablet, Rfl: 3  Review of Systems  Constitutional: Negative for appetite change, chills, fatigue and fever.  HENT: Negative.   Eyes: Negative.   Respiratory: Negative for chest tightness and shortness of breath.   Cardiovascular: Negative for chest pain and palpitations.  Gastrointestinal: Negative for abdominal pain, nausea and vomiting.  Endocrine: Negative.   Genitourinary: Positive for flank pain, frequency and urgency. Negative for hematuria and hesitancy.  Allergic/Immunologic: Negative.   Neurological: Negative for dizziness and weakness.  Psychiatric/Behavioral: The patient is nervous/anxious.     Social History   Tobacco Use  . Smoking status: Never Smoker  . Smokeless tobacco: Never Used  Substance Use  Topics  . Alcohol use: No      Objective:   BP 121/70 (BP Location: Right Arm, Patient Position: Sitting, Cuff Size: Normal)   Pulse (!) 54   Temp (!) 96.9 F (36.1 C) (Other (Comment))   Resp 18   Ht 5\' 5"  (1.651 m)   Wt 119 lb (54 kg)   SpO2 98%   BMI 19.80 kg/m  Vitals:   03/24/19 1036  BP: 121/70  Pulse: (!) 54  Resp: 18  Temp: (!) 96.9 F (36.1 C)  TempSrc: Other (Comment)  SpO2: 98%  Weight: 119 lb (54 kg)  Height: 5\' 5"  (1.651 m)  Body mass index is 19.8  kg/m.   Physical Exam Vitals signs reviewed.  Constitutional:      Appearance: She is well-developed.  HENT:     Head: Normocephalic and atraumatic.     Right Ear: Hearing and tympanic membrane normal.  Eyes:     Pupils: Pupils are equal, round, and reactive to light.  Neck:     Musculoskeletal: Normal range of motion and neck supple.  Cardiovascular:     Rate and Rhythm: Normal rate and regular rhythm.  Pulmonary:     Effort: Pulmonary effort is normal.  Abdominal:     General: Abdomen is flat. Bowel sounds are normal.     Palpations: Abdomen is soft.     Tenderness: There is no abdominal tenderness.     Comments: No CVAT.  Skin:    General: Skin is warm.  Neurological:     Mental Status: She is alert.      Results for orders placed or performed in visit on 03/24/19  POCT urinalysis dipstick  Result Value Ref Range   Color, UA Dark Yellow    Clarity, UA Clear    Glucose, UA Negative Negative   Bilirubin, UA Neg    Ketones, UA Trace    Spec Grav, UA 1.010 1.010 - 1.025   Blood, UA Large Hemolyzed    pH, UA 6.0 5.0 - 8.0   Protein, UA Positive (A) Negative   Urobilinogen, UA 0.2 0.2 or 1.0 E.U./dL   Nitrite, UA Neg    Leukocytes, UA Large (3+) (A) Negative   Appearance Abnormal    Odor Normal        Assessment & Plan    1. Dysuria Discussed possible referral to Dr. Kenton Kingfisher. - poct Urinalysis dipstick  - CULTURE, URINE COMPREHENSIVE  2. Acute cystitis with hematuria  - nitrofurantoin, macrocrystal-monohydrate, (MACROBID) 100 MG capsule; Take 1 capsule (100 mg total) by mouth 2 (two) times daily.  Dispense: 10 capsule; Refill: 3 - phenazopyridine (AZO-TABS) 95 MG tablet; Take 1 tablet (95 mg total) by mouth 3 (three) times daily as needed for pain.  Dispense: 10 tablet; Refill: 3  Other orders - Fexofenadine HCl (ALLEGRA PO); Take by mouth. - furosemide (LASIX) 20 MG tablet; TAKE ONE TABLET EVERY OTHER DAY     Wilhemena Durie, MD  [facility]

## 2019-03-27 LAB — CULTURE, URINE COMPREHENSIVE

## 2019-03-29 ENCOUNTER — Telehealth: Payer: Self-pay

## 2019-03-29 NOTE — Telephone Encounter (Signed)
LMTCB

## 2019-03-29 NOTE — Telephone Encounter (Signed)
Pt returned missed call.  Please call pt back. ° °Thanks, °[facility] °

## 2019-03-29 NOTE — Telephone Encounter (Signed)
Patient has been advised. KW 

## 2019-03-29 NOTE — Telephone Encounter (Signed)
-----   Message from Jerrol Banana., MD sent at 03/28/2019  6:53 AM EST ----- Covered with Macrobid she was given.

## 2019-03-30 ENCOUNTER — Other Ambulatory Visit: Payer: Self-pay | Admitting: Family Medicine

## 2019-03-30 NOTE — Telephone Encounter (Signed)
Pt needing a call back.  She says since she has stopped the antibiotic her UTI symptoms have come back.  Pt asking if something stronger could be called in for her asap.  Please call pt back at [phone] to let her know on this.  Thanks, [employer]

## 2019-03-30 NOTE — Telephone Encounter (Signed)
Please advise? Phone message from yesterday advised microbid would cover infection.

## 2019-03-30 NOTE — Telephone Encounter (Signed)
Regular dose Septra BID for 5 days

## 2019-03-31 MED ORDER — SULFAMETHOXAZOLE-TRIMETHOPRIM 400-80 MG PO TABS: 1.0000 | ORAL_TABLET | Freq: Two times a day (BID) | ORAL | 3 refills | Status: DC

## 2019-03-31 NOTE — Telephone Encounter (Signed)
Are we changing the antibiotic from the [place] to the Septra for the patient?

## 2019-03-31 NOTE — Telephone Encounter (Signed)
The antibiotic has been sent into the patients pharmacy.

## 2019-04-05 DIAGNOSIS — H353211 Exudative age-related macular degeneration, right eye, with active choroidal neovascularization: Secondary | ICD-10-CM | POA: Diagnosis not present

## 2019-04-05 DIAGNOSIS — H353222 Exudative age-related macular degeneration, left eye, with inactive choroidal neovascularization: Secondary | ICD-10-CM | POA: Diagnosis not present

## 2019-04-29 DIAGNOSIS — E782 Mixed hyperlipidemia: Secondary | ICD-10-CM | POA: Diagnosis not present

## 2019-04-29 DIAGNOSIS — I480 Paroxysmal atrial fibrillation: Secondary | ICD-10-CM | POA: Diagnosis not present

## 2019-04-29 DIAGNOSIS — Z5181 Encounter for therapeutic drug level monitoring: Secondary | ICD-10-CM | POA: Diagnosis not present

## 2019-04-29 DIAGNOSIS — Z79899 Other long term (current) drug therapy: Secondary | ICD-10-CM | POA: Diagnosis not present

## 2019-05-05 DIAGNOSIS — L309 Dermatitis, unspecified: Secondary | ICD-10-CM | POA: Diagnosis not present

## 2019-05-05 DIAGNOSIS — L570 Actinic keratosis: Secondary | ICD-10-CM | POA: Diagnosis not present

## 2019-05-05 DIAGNOSIS — L814 Other melanin hyperpigmentation: Secondary | ICD-10-CM | POA: Diagnosis not present

## 2019-05-05 DIAGNOSIS — L821 Other seborrheic keratosis: Secondary | ICD-10-CM | POA: Diagnosis not present

## 2019-05-05 DIAGNOSIS — Z85828 Personal history of other malignant neoplasm of skin: Secondary | ICD-10-CM | POA: Diagnosis not present

## 2019-05-05 DIAGNOSIS — D0471 Carcinoma in situ of skin of right lower limb, including hip: Secondary | ICD-10-CM | POA: Diagnosis not present

## 2019-05-05 DIAGNOSIS — D225 Melanocytic nevi of trunk: Secondary | ICD-10-CM | POA: Diagnosis not present

## 2019-05-12 DIAGNOSIS — E782 Mixed hyperlipidemia: Secondary | ICD-10-CM | POA: Diagnosis not present

## 2019-06-09 DIAGNOSIS — D0471 Carcinoma in situ of skin of right lower limb, including hip: Secondary | ICD-10-CM | POA: Diagnosis not present

## 2019-06-14 DIAGNOSIS — H353211 Exudative age-related macular degeneration, right eye, with active choroidal neovascularization: Secondary | ICD-10-CM | POA: Diagnosis not present

## 2019-08-23 DIAGNOSIS — H353211 Exudative age-related macular degeneration, right eye, with active choroidal neovascularization: Secondary | ICD-10-CM | POA: Diagnosis not present

## 2019-10-28 DIAGNOSIS — I480 Paroxysmal atrial fibrillation: Secondary | ICD-10-CM | POA: Diagnosis not present

## 2019-10-28 DIAGNOSIS — E782 Mixed hyperlipidemia: Secondary | ICD-10-CM | POA: Diagnosis not present

## 2019-10-28 DIAGNOSIS — I340 Nonrheumatic mitral (valve) insufficiency: Secondary | ICD-10-CM | POA: Diagnosis not present

## 2019-11-02 DIAGNOSIS — H353211 Exudative age-related macular degeneration, right eye, with active choroidal neovascularization: Secondary | ICD-10-CM | POA: Diagnosis not present

## 2019-11-10 DIAGNOSIS — L814 Other melanin hyperpigmentation: Secondary | ICD-10-CM | POA: Diagnosis not present

## 2019-11-10 DIAGNOSIS — L820 Inflamed seborrheic keratosis: Secondary | ICD-10-CM | POA: Diagnosis not present

## 2019-11-10 DIAGNOSIS — L570 Actinic keratosis: Secondary | ICD-10-CM | POA: Diagnosis not present

## 2019-11-10 DIAGNOSIS — L821 Other seborrheic keratosis: Secondary | ICD-10-CM | POA: Diagnosis not present

## 2019-11-10 DIAGNOSIS — Z85828 Personal history of other malignant neoplasm of skin: Secondary | ICD-10-CM | POA: Diagnosis not present

## 2020-01-03 ENCOUNTER — Other Ambulatory Visit: Payer: Self-pay | Admitting: Family Medicine

## 2020-01-03 DIAGNOSIS — Z1231 Encounter for screening mammogram for malignant neoplasm of breast: Secondary | ICD-10-CM

## 2020-01-04 DIAGNOSIS — H353211 Exudative age-related macular degeneration, right eye, with active choroidal neovascularization: Secondary | ICD-10-CM | POA: Diagnosis not present

## 2020-01-06 DIAGNOSIS — S0501XA Injury of conjunctiva and corneal abrasion without foreign body, right eye, initial encounter: Secondary | ICD-10-CM | POA: Diagnosis not present

## 2020-01-10 DIAGNOSIS — S0501XD Injury of conjunctiva and corneal abrasion without foreign body, right eye, subsequent encounter: Secondary | ICD-10-CM | POA: Diagnosis not present

## 2020-01-21 ENCOUNTER — Other Ambulatory Visit: Payer: Self-pay

## 2020-01-21 ENCOUNTER — Ambulatory Visit
Admission: RE | Admit: 2020-01-21 | Discharge: 2020-01-21 | Disposition: A | Discharge status: Home / Self Care | Payer: Medicare Other | Source: Ambulatory Visit | Attending: Family Medicine | Admitting: Family Medicine

## 2020-01-21 DIAGNOSIS — Z1231 Encounter for screening mammogram for malignant neoplasm of breast: Secondary | ICD-10-CM | POA: Insufficient documentation

## 2020-02-08 ENCOUNTER — Ambulatory Visit (INDEPENDENT_AMBULATORY_CARE_PROVIDER_SITE_OTHER): Payer: Medicare Other | Admitting: Family Medicine

## 2020-02-08 ENCOUNTER — Other Ambulatory Visit: Payer: Self-pay

## 2020-02-08 ENCOUNTER — Encounter: Payer: Self-pay | Admitting: Family Medicine

## 2020-02-08 VITALS — BP 114/72 | HR 54 | Temp 97.9°F | Wt 121.6 lb

## 2020-02-08 DIAGNOSIS — J3089 Other allergic rhinitis: Secondary | ICD-10-CM | POA: Diagnosis not present

## 2020-02-08 DIAGNOSIS — J320 Chronic maxillary sinusitis: Secondary | ICD-10-CM

## 2020-02-08 DIAGNOSIS — I4891 Unspecified atrial fibrillation: Secondary | ICD-10-CM | POA: Diagnosis not present

## 2020-02-08 MED ORDER — DOXYCYCLINE HYCLATE 100 MG PO TABS: 100.0000 mg | ORAL_TABLET | Freq: Two times a day (BID) | ORAL | 0 refills | Status: DC

## 2020-02-08 NOTE — Progress Notes (Signed)
Trena Platt Claude Swendsen,acting as a scribe for Wilhemena Durie, MD.,have documented all relevant documentation on the behalf of Wilhemena Durie, MD,as directed by  Wilhemena Durie, MD while in the presence of Wilhemena Durie, MD.  Established patient visit   Patient: Darlene Serrano   DOB: [birth year]   85 y.o. Female  MRN: [id number] Visit Date: 02/08/2020  Today's healthcare provider: Wilhemena Durie, MD   Chief Complaint  Patient presents with  . Sinus Problem   Subjective    Sinus Problem This is a new problem. The current episode started more than 1 month ago. The problem has been waxing and waning since onset. There has been no fever. Associated symptoms include ear pain, sinus pressure, sneezing and swollen glands. Pertinent negatives include no chills, congestion, coughing, headaches, hoarse voice, shortness of breath or sore throat. Past treatments include nothing.   He has right sided facial pain and right ear pain.   Social History   Tobacco Use  . Smoking status: Never Smoker  . Smokeless tobacco: Never Used  Vaping Use  . Vaping Use: Never used  Substance Use Topics  . Alcohol use: No  . Drug use: No       Medications: Outpatient Medications Prior to Visit  Medication Sig  . atorvastatin (LIPITOR) 10 MG tablet Take 1 tablet (10 mg total) by mouth every morning.  . calcium carbonate (OS-CAL) 600 MG TABS tablet Take 600 mg by mouth daily.   . cholecalciferol (VITAMIN D) 1000 UNITS tablet VITAMIN D, 1000UNIT (Oral Tablet)  1 Every Day for 0 days  Quantity: 0.00;  Refills: 0   Ordered :01-May-2010  [name] ;  Started 13-Apr-2009 Active  . ELIQUIS 5 MG TABS tablet   . Fexofenadine HCl (ALLEGRA PO) Take by mouth.  . furosemide (LASIX) 20 MG tablet TAKE ONE TABLET EVERY OTHER DAY  . Multiple Vitamins-Minerals (ICAPS PO) Take 1 tablet by mouth 2 (two) times daily.   . ALLERGY 10 MG tablet TAKE 1 TABLET BY MOUTH DAILY (Patient not taking:  Reported on 03/24/2019)  . doxycycline (VIBRA-TABS) 100 MG tablet Take 1 tablet (100 mg total) by mouth 2 (two) times daily. (Patient not taking: Reported on 03/24/2019)  . flecainide (TAMBOCOR) 50 MG tablet Take by mouth.  . fluticasone (FLONASE) 50 MCG/ACT nasal spray Place into the nose.  . metoprolol succinate (TOPROL-XL) 25 MG 24 hr tablet Take by mouth.  . phenazopyridine (AZO-TABS) 95 MG tablet Take 1 tablet (95 mg total) by mouth 3 (three) times daily as needed for pain. (Patient not taking: Reported on 02/08/2020)  . traZODone (DESYREL) 50 MG tablet Take 1 tablet (50 mg total) by mouth at bedtime as needed for sleep. (Patient not taking: Reported on 12/15/2017)  . [DISCONTINUED] montelukast (SINGULAIR) 10 MG tablet Take 1 tablet (10 mg total) by mouth at bedtime. (Patient not taking: Reported on 03/24/2019)  . [DISCONTINUED] nitrofurantoin, macrocrystal-monohydrate, (MACROBID) 100 MG capsule Take 1 capsule (100 mg total) by mouth 2 (two) times daily. (Patient not taking: Reported on 02/08/2020)  . [DISCONTINUED] sulfamethoxazole-trimethoprim (BACTRIM) 400-80 MG tablet Take 1 tablet by mouth 2 (two) times daily. (Patient not taking: Reported on 02/08/2020)   No facility-administered medications prior to visit.    Review of Systems  Constitutional: Negative for chills.  HENT: Positive for ear pain, sinus pressure and sneezing. Negative for congestion, hoarse voice and sore throat.   Respiratory: Negative for cough and shortness of breath.   Neurological: Negative  for headaches.       Objective    BP 114/72 (BP Location: Right Arm, Patient Position: Sitting, Cuff Size: Normal)   Pulse (!) 54   Temp 97.9 F (36.6 C) (Oral)   Wt 121 lb 9.6 oz (55.2 kg)   BMI 20.24 kg/m  BP Readings from Last 3 Encounters:  02/08/20 114/72  03/24/19 121/70  01/21/19 108/70   Wt Readings from Last 3 Encounters:  02/08/20 121 lb 9.6 oz (55.2 kg)  03/24/19 119 lb (54 kg)  01/21/19 119 lb (54 kg)       Physical Exam Vitals reviewed.  Constitutional:      Appearance: She is well-developed.  HENT:     Head: Normocephalic and atraumatic.     Comments: Rt maxillary tenderness.    Right Ear: Hearing, tympanic membrane and external ear normal.     Left Ear: Tympanic membrane and external ear normal.  Eyes:     Pupils: Pupils are equal, round, and reactive to light.  Cardiovascular:     Rate and Rhythm: Normal rate and regular rhythm.  Pulmonary:     Effort: Pulmonary effort is normal.  Abdominal:     Palpations: Abdomen is soft.  Musculoskeletal:     Cervical back: Normal range of motion and neck supple.  Skin:    General: Skin is warm and dry.  Neurological:     General: No focal deficit present.     Mental Status: She is alert and oriented to person, place, and time.  Psychiatric:        Mood and Affect: Mood normal.        Behavior: Behavior normal.        Thought Content: Thought content normal.        Judgment: Judgment normal.       No results found for any visits on 02/08/20.  Assessment & Plan     1. Right maxillary sinusitis  - doxycycline (VIBRA-TABS) 100 MG tablet; Take 1 tablet (100 mg total) by mouth 2 (two) times daily.  Dispense: 20 tablet; Refill: 0  2. Seasonal allergic rhinitis due to other allergic trigger   3. Atrial fibrillation, unspecified type ([place])    Return in about 4 months (around 06/09/2020) for CPE .      I, Wilhemena Durie, MD, have reviewed all documentation for this visit. The documentation on 02/13/20 for the exam, diagnosis, procedures, and orders are all accurate and complete.    Richard Cranford Mon, MD  [facility] [phone] (phone) [phone] (fax)  [place]

## 2020-02-09 ENCOUNTER — Ambulatory Visit: Payer: Medicare Other

## 2020-02-23 ENCOUNTER — Ambulatory Visit (INDEPENDENT_AMBULATORY_CARE_PROVIDER_SITE_OTHER): Payer: Medicare Other

## 2020-02-23 ENCOUNTER — Other Ambulatory Visit: Payer: Self-pay

## 2020-02-23 DIAGNOSIS — Z23 Encounter for immunization: Secondary | ICD-10-CM

## 2020-03-07 DIAGNOSIS — H353222 Exudative age-related macular degeneration, left eye, with inactive choroidal neovascularization: Secondary | ICD-10-CM | POA: Diagnosis not present

## 2020-03-07 DIAGNOSIS — H353211 Exudative age-related macular degeneration, right eye, with active choroidal neovascularization: Secondary | ICD-10-CM | POA: Diagnosis not present

## 2020-03-17 ENCOUNTER — Ambulatory Visit: Payer: Self-pay | Admitting: *Deleted

## 2020-03-17 ENCOUNTER — Telehealth: Payer: Self-pay | Admitting: Family Medicine

## 2020-03-17 NOTE — Telephone Encounter (Signed)
Patient is calling to request advise on her constipation issues. Patient states she has not been going regularly and is skipping days- she is concerned that she is going to get into trouble and would like to know if there is something that can be delivered by her pharmacy to help. Patient states she walks 3 miles/day, eats small amounts and tries to stay hydrated. She has been using dulcolax and fiber and she has not had success with becoming regular. She wants to know if there is something stronger she can try.( Patient states she really does not want MOM.) Declines appointment- just would like advise- last stool - 2 days ago.  Reason for Disposition . Constipation is a chronic symptom (recurrent or ongoing AND present > 4 weeks)  Answer Assessment - Initial Assessment Questions 1. STOOL PATTERN OR FREQUENCY: "How often do you pass bowel movements (BMs)?"  (Normal range: tid to q 3 days)  "When was the last BM passed?"       Regular daily to every other day 2. STRAINING: "Do you have to strain to have a BM?"      No normal-but lately she has 3. RECTAL PAIN: "Does your rectum hurt when the stool comes out?" If Yes, ask: "Do you have hemorrhoids? How bad is the pain?"  (Scale 1-10; or mild, moderate, severe)     no 4. STOOL COMPOSITION: "Are the stools hard?"      yes 5. BLOOD ON STOOLS: "Has there been any blood on the toilet tissue or on the surface of the BM?" If Yes, ask: "When was the last time?"      no 6. CHRONIC CONSTIPATION: "Is this a new problem for you?"  If no, ask: "How long have you had this problem?" (days, weeks, months)      Years- takes medication 7. CHANGES IN DIET OR HYDRATION: "Have there been any recent changes in your diet?" "How much fluids are you drinking consuming on a daily basis?"  "How much have you had to drink today?"     No- patient tries to drink water and eat healthy 8. MEDICATIONS: "Have you been taking any new medications?" "Are you taking any narcotic pain  medications?" (e.g., Vicoden, Percocet, morphine, dilaudid)     no 9. LAXATIVES: "Have you been using any stool softeners, laxatives, or enemas?"  If yes, ask "What, how often, and when was the last time?" ducal ax- duel action,metamucil- daily 10.ACTIVITY:  "How much walking do you do every day? on a daily basis?"  "Has your activity level decreased in the past week?"        Patient walks 3 miles/day 11. CAUSE: "What do you think is causing the constipation?"        unsure 12. OTHER SYMPTOMS: "Do you have any other symptoms?" (e.g., abdominal pain, bloating, fever, vomiting)       no 13. MEDICAL HISTORY: "Do you have a history of hemorrhoids, rectal fissures, or rectal surgery or rectal abscess?"         no 14. PREGNANCY: "Is there any chance you are pregnant?" "When was your last menstrual period?"       n/a  Protocols used: CONSTIPATION-A-AH

## 2020-03-17 NOTE — Telephone Encounter (Signed)
Take one tablespoon up to three times a day as needed. Drink a full glass of water after each dose.

## 2020-03-17 NOTE — Telephone Encounter (Signed)
Please advise directions for Milk Of Magnesia?

## 2020-03-17 NOTE — Telephone Encounter (Signed)
Patient was advised of directions for MOM. Patient also wanted to know if there is anything stronger than MOM, dulcolax, and fiber that she can try?

## 2020-03-17 NOTE — Telephone Encounter (Signed)
Ms Pappalardo has called back stating that she spoke with a nurse this Am about constipation.  She had requested call back from office for advice and possible medication. Per Ms Lona Kettle she has not had a response from the office. She states that her pharmacy will close now and so she walked to store and bought MOM. She is unable to read the directions. She is requesting to speak with any provider at the office for advice.  She states she needs relief.

## 2020-03-17 NOTE — Telephone Encounter (Signed)
Patient was advised.  

## 2020-03-17 NOTE — Telephone Encounter (Signed)
OTC Miralax is the most effective. Take one heaping spoonful and mix with water once a day.

## 2020-04-03 DIAGNOSIS — M533 Sacrococcygeal disorders, not elsewhere classified: Secondary | ICD-10-CM | POA: Diagnosis not present

## 2020-04-03 DIAGNOSIS — M47816 Spondylosis without myelopathy or radiculopathy, lumbar region: Secondary | ICD-10-CM | POA: Diagnosis not present

## 2020-04-03 DIAGNOSIS — S299XXA Unspecified injury of thorax, initial encounter: Secondary | ICD-10-CM | POA: Diagnosis not present

## 2020-04-03 DIAGNOSIS — S329XXA Fracture of unspecified parts of lumbosacral spine and pelvis, initial encounter for closed fracture: Secondary | ICD-10-CM | POA: Diagnosis not present

## 2020-04-03 DIAGNOSIS — W010XXA Fall on same level from slipping, tripping and stumbling without subsequent striking against object, initial encounter: Secondary | ICD-10-CM | POA: Diagnosis not present

## 2020-04-03 DIAGNOSIS — M25551 Pain in right hip: Secondary | ICD-10-CM | POA: Diagnosis not present

## 2020-04-03 DIAGNOSIS — M545 Low back pain, unspecified: Secondary | ICD-10-CM | POA: Diagnosis not present

## 2020-04-10 DIAGNOSIS — M461 Sacroiliitis, not elsewhere classified: Secondary | ICD-10-CM | POA: Diagnosis not present

## 2020-04-24 DIAGNOSIS — M461 Sacroiliitis, not elsewhere classified: Secondary | ICD-10-CM | POA: Diagnosis not present

## 2020-05-08 DIAGNOSIS — I493 Ventricular premature depolarization: Secondary | ICD-10-CM | POA: Diagnosis not present

## 2020-05-08 DIAGNOSIS — Z5181 Encounter for therapeutic drug level monitoring: Secondary | ICD-10-CM | POA: Diagnosis not present

## 2020-05-08 DIAGNOSIS — I340 Nonrheumatic mitral (valve) insufficiency: Secondary | ICD-10-CM | POA: Diagnosis not present

## 2020-05-08 DIAGNOSIS — R9431 Abnormal electrocardiogram [ECG] [EKG]: Secondary | ICD-10-CM | POA: Diagnosis not present

## 2020-05-08 DIAGNOSIS — I480 Paroxysmal atrial fibrillation: Secondary | ICD-10-CM | POA: Diagnosis not present

## 2020-05-08 DIAGNOSIS — E782 Mixed hyperlipidemia: Secondary | ICD-10-CM | POA: Diagnosis not present

## 2020-05-09 DIAGNOSIS — H353211 Exudative age-related macular degeneration, right eye, with active choroidal neovascularization: Secondary | ICD-10-CM | POA: Diagnosis not present

## 2020-05-10 DIAGNOSIS — M461 Sacroiliitis, not elsewhere classified: Secondary | ICD-10-CM | POA: Diagnosis not present

## 2020-05-15 DIAGNOSIS — M461 Sacroiliitis, not elsewhere classified: Secondary | ICD-10-CM | POA: Diagnosis not present

## 2020-05-18 DIAGNOSIS — M461 Sacroiliitis, not elsewhere classified: Secondary | ICD-10-CM | POA: Diagnosis not present

## 2020-05-23 DIAGNOSIS — M461 Sacroiliitis, not elsewhere classified: Secondary | ICD-10-CM | POA: Diagnosis not present

## 2020-05-25 DIAGNOSIS — M461 Sacroiliitis, not elsewhere classified: Secondary | ICD-10-CM | POA: Diagnosis not present

## 2020-06-06 DIAGNOSIS — R0600 Dyspnea, unspecified: Secondary | ICD-10-CM | POA: Diagnosis not present

## 2020-06-06 DIAGNOSIS — Z79899 Other long term (current) drug therapy: Secondary | ICD-10-CM | POA: Diagnosis not present

## 2020-06-06 DIAGNOSIS — I493 Ventricular premature depolarization: Secondary | ICD-10-CM | POA: Diagnosis not present

## 2020-06-06 DIAGNOSIS — E782 Mixed hyperlipidemia: Secondary | ICD-10-CM | POA: Diagnosis not present

## 2020-06-06 DIAGNOSIS — R0602 Shortness of breath: Secondary | ICD-10-CM | POA: Diagnosis not present

## 2020-06-06 DIAGNOSIS — R000 Tachycardia, unspecified: Secondary | ICD-10-CM | POA: Diagnosis not present

## 2020-06-06 DIAGNOSIS — Z5181 Encounter for therapeutic drug level monitoring: Secondary | ICD-10-CM | POA: Diagnosis not present

## 2020-06-06 DIAGNOSIS — R0789 Other chest pain: Secondary | ICD-10-CM | POA: Diagnosis not present

## 2020-06-06 DIAGNOSIS — I480 Paroxysmal atrial fibrillation: Secondary | ICD-10-CM | POA: Diagnosis not present

## 2020-06-06 DIAGNOSIS — R002 Palpitations: Secondary | ICD-10-CM | POA: Diagnosis not present

## 2020-06-06 DIAGNOSIS — I340 Nonrheumatic mitral (valve) insufficiency: Secondary | ICD-10-CM | POA: Diagnosis not present

## 2020-06-14 DIAGNOSIS — R0602 Shortness of breath: Secondary | ICD-10-CM | POA: Diagnosis not present

## 2020-06-14 DIAGNOSIS — I480 Paroxysmal atrial fibrillation: Secondary | ICD-10-CM | POA: Diagnosis not present

## 2020-06-14 DIAGNOSIS — R0789 Other chest pain: Secondary | ICD-10-CM | POA: Diagnosis not present

## 2020-06-14 DIAGNOSIS — R000 Tachycardia, unspecified: Secondary | ICD-10-CM | POA: Diagnosis not present

## 2020-06-15 ENCOUNTER — Encounter: Payer: Self-pay | Admitting: Family Medicine

## 2020-06-16 NOTE — Progress Notes (Signed)
Established patient visit   Patient: Darlene Serrano   DOB: 29-Mar-1935   86 y.o. Female  MRN: [id number] Visit Date: 06/19/2020  Today's healthcare provider: Wilhemena Durie, MD   No chief complaint on file.  Subjective    HPI  Patient comes in today for follow-up. Everything appears to be stable. She is anxious but has no complaints today. Lipid/Cholesterol, follow-up  Last Lipid Panel: Lab Results  Component Value Date   CHOL 141 08/04/2017   LDLCALC 57 08/04/2017   HDL 71 08/04/2017   TRIG 67 08/04/2017    She was last seen for this 08/04/2017.  Management since that visit includes; no changes made.  She reports excellent compliance with treatment. She is not having side effects.  She is following a Regular diet. Current exercise: walking and instructor lead work outs.  Last metabolic panel Lab Results  Component Value Date   GLUCOSE 91 01/21/2019   NA 139 01/21/2019   K 4.8 01/21/2019   BUN 15 01/21/2019   CREATININE 0.75 01/21/2019   GFRNONAA 75 01/21/2019   GFRAA 86 01/21/2019   CALCIUM 9.5 01/21/2019   AST 22 01/21/2019   ALT 16 01/21/2019   The ASCVD Risk score Mikey Bussing DC Jr., et al., 2013) failed to calculate for the following reasons:   The 2013 ASCVD risk score is only valid for ages 9 to 107  ---------------------------------------------------------------------------------------------------       Medications: Outpatient Medications Prior to Visit  Medication Sig  . ALLERGY 10 MG tablet TAKE 1 TABLET BY MOUTH DAILY (Patient not taking: Reported on 03/24/2019)  . atorvastatin (LIPITOR) 10 MG tablet Take 1 tablet (10 mg total) by mouth every morning.  . calcium carbonate (OS-CAL) 600 MG TABS tablet Take 600 mg by mouth daily.   . cholecalciferol (VITAMIN D) 1000 UNITS tablet VITAMIN D, 1000UNIT (Oral Tablet)  1 Every Day for 0 days  Quantity: 0.00;  Refills: 0   Ordered :01-May-2010  [name] ;  Started 13-Apr-2009 Active  .  doxycycline (VIBRA-TABS) 100 MG tablet Take 1 tablet (100 mg total) by mouth 2 (two) times daily. (Patient not taking: Reported on 03/24/2019)  . doxycycline (VIBRA-TABS) 100 MG tablet Take 1 tablet (100 mg total) by mouth 2 (two) times daily.  Marland Kitchen ELIQUIS 5 MG TABS tablet   . Fexofenadine HCl (ALLEGRA PO) Take by mouth.  . flecainide (TAMBOCOR) 50 MG tablet Take by mouth.  . fluticasone (FLONASE) 50 MCG/ACT nasal spray Place into the nose.  . furosemide (LASIX) 20 MG tablet TAKE ONE TABLET EVERY OTHER DAY  . metoprolol succinate (TOPROL-XL) 25 MG 24 hr tablet Take by mouth.  . Multiple Vitamins-Minerals (ICAPS PO) Take 1 tablet by mouth 2 (two) times daily.   . phenazopyridine (AZO-TABS) 95 MG tablet Take 1 tablet (95 mg total) by mouth 3 (three) times daily as needed for pain. (Patient not taking: Reported on 02/08/2020)  . traZODone (DESYREL) 50 MG tablet Take 1 tablet (50 mg total) by mouth at bedtime as needed for sleep. (Patient not taking: Reported on 12/15/2017)   No facility-administered medications prior to visit.    Review of Systems  Constitutional: Negative for appetite change, chills, fatigue and fever.  Respiratory: Negative for chest tightness and shortness of breath.   Cardiovascular: Negative for chest pain and palpitations.  Gastrointestinal: Negative for abdominal pain, nausea and vomiting.  Neurological: Negative for dizziness and weakness.        Objective  There were no vitals taken for this visit. BP Readings from Last 3 Encounters:  06/19/20 137/72  02/08/20 114/72  03/24/19 121/70   Wt Readings from Last 3 Encounters:  06/19/20 122 lb (55.3 kg)  02/08/20 121 lb 9.6 oz (55.2 kg)  03/24/19 119 lb (54 kg)       Physical Exam Vitals reviewed.  Constitutional:      Appearance: She is well-developed.  HENT:     Head: Normocephalic and atraumatic.     Right Ear: Hearing and tympanic membrane normal.  Eyes:     Pupils: Pupils are equal, round, and  reactive to light.  Cardiovascular:     Rate and Rhythm: Normal rate and regular rhythm.  Pulmonary:     Effort: Pulmonary effort is normal.  Abdominal:     General: Abdomen is flat. Bowel sounds are normal.     Palpations: Abdomen is soft.     Tenderness: There is no abdominal tenderness.  Musculoskeletal:     Cervical back: Normal range of motion and neck supple.  Skin:    General: Skin is warm.  Neurological:     Mental Status: She is alert.      1. High cholesterol  - Lipid Panel With LDL/HDL Ratio  2. Atrial fibrillation, unspecified type ([place]) Followed by cardiology - CBC With Differential  3. Hypertension, unspecified type Good control - Comprehensive metabolic panel  4. Allergic rhinitis due to other allergic trigger, unspecified seasonality Chronic problem. - TSH    No follow-ups on file.      I, Wilhemena Durie, MD, have reviewed all documentation for this visit. The documentation on 07/11/20 for the exam, diagnosis, procedures, and orders are all accurate and complete.    Jazlen Ogarro Cranford Mon, MD  [facility] [phone] (phone) [phone] (fax)  [place]

## 2020-06-19 ENCOUNTER — Ambulatory Visit (INDEPENDENT_AMBULATORY_CARE_PROVIDER_SITE_OTHER): Payer: Medicare Other | Admitting: Family Medicine

## 2020-06-19 ENCOUNTER — Other Ambulatory Visit: Payer: Self-pay

## 2020-06-19 VITALS — BP 137/72 | HR 58 | Temp 97.9°F | Ht 65.0 in | Wt 122.0 lb

## 2020-06-19 DIAGNOSIS — I4891 Unspecified atrial fibrillation: Secondary | ICD-10-CM

## 2020-06-19 DIAGNOSIS — E7800 Pure hypercholesterolemia, unspecified: Secondary | ICD-10-CM | POA: Diagnosis not present

## 2020-06-19 DIAGNOSIS — J3089 Other allergic rhinitis: Secondary | ICD-10-CM

## 2020-06-19 DIAGNOSIS — I10 Essential (primary) hypertension: Secondary | ICD-10-CM | POA: Diagnosis not present

## 2020-06-19 DIAGNOSIS — I270 Primary pulmonary hypertension: Secondary | ICD-10-CM

## 2020-06-20 LAB — CBC WITH DIFFERENTIAL
Basophils Absolute: 0.1 10*3/uL (ref 0.0–0.2)
Basos: 1 %
EOS (ABSOLUTE): 0.2 10*3/uL (ref 0.0–0.4)
Eos: 4 %
Hematocrit: 40.6 % (ref 34.0–46.6)
Hemoglobin: 13.8 g/dL (ref 11.1–15.9)
Immature Grans (Abs): 0 10*3/uL (ref 0.0–0.1)
Immature Granulocytes: 0 %
Lymphocytes Absolute: 1.8 10*3/uL (ref 0.7–3.1)
Lymphs: 27 %
MCH: 31.5 pg (ref 26.6–33.0)
MCHC: 34 g/dL (ref 31.5–35.7)
MCV: 93 fL (ref 79–97)
Monocytes Absolute: 0.4 10*3/uL (ref 0.1–0.9)
Monocytes: 7 %
Neutrophils Absolute: 4.1 10*3/uL (ref 1.4–7.0)
Neutrophils: 61 %
RBC: 4.38 x10E6/uL (ref 3.77–5.28)
RDW: 12.6 % (ref 11.7–15.4)
WBC: 6.7 10*3/uL (ref 3.4–10.8)

## 2020-06-20 LAB — LIPID PANEL WITH LDL/HDL RATIO
Cholesterol, Total: 163 mg/dL (ref 100–199)
HDL: 63 mg/dL (ref >39)
LDL Chol Calc (NIH): 86 mg/dL (ref 0–99)
LDL/HDL Ratio: 1.4 ratio (ref 0.0–3.2)
Triglycerides: 75 mg/dL (ref 0–149)
VLDL Cholesterol Cal: 14 mg/dL (ref 5–40)

## 2020-06-20 LAB — COMPREHENSIVE METABOLIC PANEL
ALT: 15 IU/L (ref 0–32)
AST: 25 IU/L (ref 0–40)
Albumin/Globulin Ratio: 1.8 (ref 1.2–2.2)
Albumin: 4.3 g/dL (ref 3.6–4.6)
Alkaline Phosphatase: 87 IU/L (ref 44–121)
BUN/Creatinine Ratio: 16 (ref 12–28)
BUN: 14 mg/dL (ref 8–27)
Bilirubin Total: 0.7 mg/dL (ref 0.0–1.2)
CO2: 27 mmol/L (ref 20–29)
Calcium: 9.7 mg/dL (ref 8.7–10.3)
Chloride: 100 mmol/L (ref 96–106)
Creatinine, Ser: 0.89 mg/dL (ref 0.57–1.00)
GFR calc Af Amer: 69 mL/min/{1.73_m2} (ref >59)
GFR calc non Af Amer: 60 mL/min/{1.73_m2} (ref >59)
Globulin, Total: 2.4 g/dL (ref 1.5–4.5)
Glucose: 99 mg/dL (ref 65–99)
Potassium: 4.2 mmol/L (ref 3.5–5.2)
Sodium: 141 mmol/L (ref 134–144)
Total Protein: 6.7 g/dL (ref 6.0–8.5)

## 2020-06-20 LAB — TSH: TSH: 2.44 u[IU]/mL (ref 0.450–4.500)

## 2020-06-22 DIAGNOSIS — E782 Mixed hyperlipidemia: Secondary | ICD-10-CM | POA: Diagnosis not present

## 2020-06-22 DIAGNOSIS — I480 Paroxysmal atrial fibrillation: Secondary | ICD-10-CM | POA: Diagnosis not present

## 2020-06-22 DIAGNOSIS — I340 Nonrheumatic mitral (valve) insufficiency: Secondary | ICD-10-CM | POA: Diagnosis not present

## 2020-07-18 DIAGNOSIS — H353211 Exudative age-related macular degeneration, right eye, with active choroidal neovascularization: Secondary | ICD-10-CM | POA: Diagnosis not present

## 2020-09-04 ENCOUNTER — Telehealth: Payer: Self-pay | Admitting: Family Medicine

## 2020-09-04 NOTE — Telephone Encounter (Signed)
I think this is all allergies.  How about a virtual visit through the new [place] telemedicine. I will try a Nettie pot or saline nasal irrigations for her symptoms.

## 2020-09-04 NOTE — Telephone Encounter (Signed)
Please advise 

## 2020-09-04 NOTE — Telephone Encounter (Signed)
Pt would like to know if Dr Rosanna Randy will call her in a Rx for a sinus infection?  Pt states she is allergic to tree pollen and it has been terrible for her. Pt states he usually prescribes doxycycline (VIBRA-TABS) 100 MG tablet.  Pt has facial pain and pressure.  Pt states she does not drive and cannot get to an UC.  [place], [place]

## 2020-09-05 NOTE — Telephone Encounter (Signed)
Pt called back to say thank you and that she will try her allergy medicine, and the nasal irrigation methods. Does not prefer virtual visits she says. She will contact the office next week if symptoms worsen.

## 2020-09-05 NOTE — Telephone Encounter (Signed)
Left message to call back. Ok for [facility] to advise as below.

## 2020-09-28 ENCOUNTER — Telehealth: Payer: Self-pay

## 2020-09-28 DIAGNOSIS — Z20822 Contact with and (suspected) exposure to covid-19: Secondary | ICD-10-CM | POA: Diagnosis not present

## 2020-09-28 NOTE — Telephone Encounter (Signed)
Copied from [place] [phone]. Topic: General - Other >> Sep 28, 2020  2:59 PM Celene Kras wrote: Reason for CRM: Pt called stating that she has a cough and a scratchy throat. Pt does not want a virtual visit, but will take one is necessary. Pt is requesting to have an appt tomorrow to help as she states that she is not feeling well. Please advise.

## 2020-09-28 NOTE — Telephone Encounter (Signed)
Advised pt we do not have opening tomorrow and she would need to be seen at an UC.   Thanks,   -Mickel Baas

## 2020-10-01 DIAGNOSIS — B9689 Other specified bacterial agents as the cause of diseases classified elsewhere: Secondary | ICD-10-CM | POA: Diagnosis not present

## 2020-10-01 DIAGNOSIS — J209 Acute bronchitis, unspecified: Secondary | ICD-10-CM | POA: Diagnosis not present

## 2020-10-01 DIAGNOSIS — J019 Acute sinusitis, unspecified: Secondary | ICD-10-CM | POA: Diagnosis not present

## 2020-10-09 DIAGNOSIS — H353211 Exudative age-related macular degeneration, right eye, with active choroidal neovascularization: Secondary | ICD-10-CM | POA: Diagnosis not present

## 2020-10-18 DIAGNOSIS — H353211 Exudative age-related macular degeneration, right eye, with active choroidal neovascularization: Secondary | ICD-10-CM | POA: Diagnosis not present

## 2020-11-09 DIAGNOSIS — L565 Disseminated superficial actinic porokeratosis (DSAP): Secondary | ICD-10-CM | POA: Diagnosis not present

## 2020-11-09 DIAGNOSIS — C4441 Basal cell carcinoma of skin of scalp and neck: Secondary | ICD-10-CM | POA: Diagnosis not present

## 2020-11-09 DIAGNOSIS — L821 Other seborrheic keratosis: Secondary | ICD-10-CM | POA: Diagnosis not present

## 2020-11-09 DIAGNOSIS — Z85828 Personal history of other malignant neoplasm of skin: Secondary | ICD-10-CM | POA: Diagnosis not present

## 2020-11-09 DIAGNOSIS — L570 Actinic keratosis: Secondary | ICD-10-CM | POA: Diagnosis not present

## 2020-12-13 DIAGNOSIS — R001 Bradycardia, unspecified: Secondary | ICD-10-CM | POA: Diagnosis not present

## 2020-12-13 DIAGNOSIS — I493 Ventricular premature depolarization: Secondary | ICD-10-CM | POA: Diagnosis not present

## 2020-12-13 DIAGNOSIS — I340 Nonrheumatic mitral (valve) insufficiency: Secondary | ICD-10-CM | POA: Diagnosis not present

## 2020-12-13 DIAGNOSIS — E782 Mixed hyperlipidemia: Secondary | ICD-10-CM | POA: Diagnosis not present

## 2020-12-13 DIAGNOSIS — I480 Paroxysmal atrial fibrillation: Secondary | ICD-10-CM | POA: Diagnosis not present

## 2020-12-18 DIAGNOSIS — H353211 Exudative age-related macular degeneration, right eye, with active choroidal neovascularization: Secondary | ICD-10-CM | POA: Diagnosis not present

## 2020-12-19 ENCOUNTER — Other Ambulatory Visit: Payer: Self-pay | Admitting: Family Medicine

## 2020-12-19 DIAGNOSIS — Z1231 Encounter for screening mammogram for malignant neoplasm of breast: Secondary | ICD-10-CM

## 2020-12-25 ENCOUNTER — Ambulatory Visit: Payer: Self-pay | Admitting: Family Medicine

## 2021-01-02 ENCOUNTER — Encounter: Payer: Self-pay | Admitting: Family Medicine

## 2021-01-02 ENCOUNTER — Ambulatory Visit (INDEPENDENT_AMBULATORY_CARE_PROVIDER_SITE_OTHER): Payer: Medicare Other | Admitting: Family Medicine

## 2021-01-02 ENCOUNTER — Other Ambulatory Visit: Payer: Self-pay

## 2021-01-02 VITALS — BP 145/72 | HR 54 | Temp 98.2°F | Resp 16 | Wt 119.0 lb

## 2021-01-02 DIAGNOSIS — K130 Diseases of lips: Secondary | ICD-10-CM

## 2021-01-02 DIAGNOSIS — R002 Palpitations: Secondary | ICD-10-CM

## 2021-01-02 DIAGNOSIS — M160 Bilateral primary osteoarthritis of hip: Secondary | ICD-10-CM

## 2021-01-02 DIAGNOSIS — E7800 Pure hypercholesterolemia, unspecified: Secondary | ICD-10-CM

## 2021-01-02 DIAGNOSIS — I4891 Unspecified atrial fibrillation: Secondary | ICD-10-CM | POA: Diagnosis not present

## 2021-01-02 NOTE — Progress Notes (Signed)
Established patient visit   Patient: Darlene Serrano   DOB: March 11, 1935   86 y.o. Female  MRN: [id number] Visit Date: 01/02/2021  Today's healthcare provider: Wilhemena Durie, MD   Chief Complaint  Patient presents with   Hyperlipidemia   Subjective    HPI  Patient has no complaints and feels well.  She does have cheilitis in the left side of her mouth.  Her dentist has her using Lotrisone cream.  She has had 4 COVID shots. Lipid/Cholesterol, follow-up  Last Lipid Panel: Lab Results  Component Value Date   CHOL 163 06/19/2020   LDLCALC 86 06/19/2020   HDL 63 06/19/2020   TRIG 75 06/19/2020    She was last seen for this 7 months ago.  Management since that visit includes; on atorvastatin.  She reports good compliance with treatment. She is not having side effects.   She is following a Regular diet. Current exercise: walking  Last metabolic panel Lab Results  Component Value Date   GLUCOSE 99 06/19/2020   NA 141 06/19/2020   K 4.2 06/19/2020   BUN 14 06/19/2020   CREATININE 0.89 06/19/2020   GFRNONAA 60 06/19/2020   GFRAA 69 06/19/2020   CALCIUM 9.7 06/19/2020   AST 25 06/19/2020   ALT 15 06/19/2020   The ASCVD Risk score Mikey Bussing DC Jr., et al., 2013) failed to calculate for the following reasons:   The 2013 ASCVD risk score is only valid for ages 13 to 51      Medications: Outpatient Medications Prior to Visit  Medication Sig   ALLERGY 10 MG tablet TAKE 1 TABLET BY MOUTH DAILY   atorvastatin (LIPITOR) 10 MG tablet Take 1 tablet (10 mg total) by mouth every morning.   calcium carbonate (OS-CAL) 600 MG TABS tablet Take 600 mg by mouth daily.    cholecalciferol (VITAMIN D) 1000 UNITS tablet VITAMIN D, 1000UNIT (Oral Tablet)  1 Every Day for 0 days  Quantity: 0.00;  Refills: 0   Ordered :01-May-2010  [name] ;  Started 13-Apr-2009 Active   doxycycline (VIBRA-TABS) 100 MG tablet Take 1 tablet (100 mg total) by mouth 2 (two) times daily.  (Patient not taking: Reported on 06/19/2020)   doxycycline (VIBRA-TABS) 100 MG tablet Take 1 tablet (100 mg total) by mouth 2 (two) times daily. (Patient not taking: Reported on 06/19/2020)   ELIQUIS 5 MG TABS tablet    Fexofenadine HCl (ALLEGRA PO) Take by mouth.   flecainide (TAMBOCOR) 50 MG tablet Take by mouth.   fluticasone (FLONASE) 50 MCG/ACT nasal spray Place into the nose.   furosemide (LASIX) 20 MG tablet TAKE ONE TABLET EVERY OTHER DAY   metoprolol succinate (TOPROL-XL) 25 MG 24 hr tablet Take by mouth.   Multiple Vitamins-Minerals (ICAPS PO) Take 1 tablet by mouth 2 (two) times daily.   phenazopyridine (AZO-TABS) 95 MG tablet Take 1 tablet (95 mg total) by mouth 3 (three) times daily as needed for pain. (Patient not taking: Reported on 06/19/2020)   traZODone (DESYREL) 50 MG tablet Take 1 tablet (50 mg total) by mouth at bedtime as needed for sleep. (Patient not taking: Reported on 06/19/2020)   No facility-administered medications prior to visit.    Review of Systems  Constitutional:  Negative for appetite change, chills, fatigue and fever.  Respiratory:  Negative for chest tightness and shortness of breath.   Cardiovascular:  Negative for chest pain and palpitations.  Gastrointestinal:  Negative for abdominal pain, nausea and vomiting.  Neurological:  Negative for dizziness and weakness.      Objective    BP (!) 145/72   Pulse (!) 54   Temp 98.2 F (36.8 C)   Resp 16   Wt 119 lb (54 kg)   BMI 19.80 kg/m     Physical Exam Vitals reviewed.  Constitutional:      Appearance: She is well-developed.  HENT:     Head: Normocephalic and atraumatic.     Right Ear: Hearing, tympanic membrane and external ear normal.     Left Ear: External ear normal.     Mouth/Throat:     Mouth: Mucous membranes are moist.     Pharynx: Oropharynx is clear.     Comments: Left angular cheilitis Eyes:     General: No scleral icterus.    Conjunctiva/sclera: Conjunctivae normal.      Pupils: Pupils are equal, round, and reactive to light.  Cardiovascular:     Rate and Rhythm: Normal rate and regular rhythm.  Pulmonary:     Effort: Pulmonary effort is normal.  Abdominal:     General: Abdomen is flat. Bowel sounds are normal.     Palpations: Abdomen is soft.     Tenderness: There is no abdominal tenderness.  Musculoskeletal:     Cervical back: Normal range of motion and neck supple.  Skin:    General: Skin is warm and dry.  Neurological:     General: No focal deficit present.     Mental Status: She is alert and oriented to person, place, and time.  Psychiatric:        Mood and Affect: Mood normal.        Behavior: Behavior normal.        Thought Content: Thought content normal.        Judgment: Judgment normal.      No results found for any visits on 01/02/21.  Assessment & Plan     1. Hypercholesterolemia   2. Rapid palpitations On Toprol  3. Primary osteoarthritis of both hips   4. Cheilitis Lysine daily. 5.  A. Fib On Eliquis  No follow-ups on file.      I, Wilhemena Durie, MD, have reviewed all documentation for this visit. The documentation on 01/07/21 for the exam, diagnosis, procedures, and orders are all accurate and complete.    Brandy Zuba Cranford Mon, MD  [facility] [phone] (phone) [phone] (fax)  [place]

## 2021-01-02 NOTE — Patient Instructions (Signed)
Take OTC Lysine once daily.

## 2021-01-22 ENCOUNTER — Ambulatory Visit
Admission: RE | Admit: 2021-01-22 | Discharge: 2021-01-22 | Disposition: A | Discharge status: Home / Self Care | Payer: Medicare Other | Source: Ambulatory Visit | Attending: Family Medicine | Admitting: Family Medicine

## 2021-01-22 ENCOUNTER — Other Ambulatory Visit: Payer: Self-pay

## 2021-01-22 DIAGNOSIS — Z1231 Encounter for screening mammogram for malignant neoplasm of breast: Secondary | ICD-10-CM | POA: Insufficient documentation

## 2021-01-30 ENCOUNTER — Other Ambulatory Visit: Payer: Self-pay | Admitting: Family Medicine

## 2021-01-30 DIAGNOSIS — R928 Other abnormal and inconclusive findings on diagnostic imaging of breast: Secondary | ICD-10-CM

## 2021-01-30 DIAGNOSIS — N6489 Other specified disorders of breast: Secondary | ICD-10-CM

## 2021-02-02 ENCOUNTER — Other Ambulatory Visit: Payer: Self-pay

## 2021-02-02 ENCOUNTER — Ambulatory Visit
Admission: RE | Admit: 2021-02-02 | Discharge: 2021-02-02 | Disposition: A | Discharge status: Home / Self Care | Payer: Medicare Other | Source: Ambulatory Visit | Attending: Family Medicine | Admitting: Family Medicine

## 2021-02-02 DIAGNOSIS — N6489 Other specified disorders of breast: Secondary | ICD-10-CM | POA: Diagnosis not present

## 2021-02-02 DIAGNOSIS — R928 Other abnormal and inconclusive findings on diagnostic imaging of breast: Secondary | ICD-10-CM

## 2021-02-02 DIAGNOSIS — R922 Inconclusive mammogram: Secondary | ICD-10-CM | POA: Diagnosis not present

## 2021-02-07 ENCOUNTER — Ambulatory Visit (INDEPENDENT_AMBULATORY_CARE_PROVIDER_SITE_OTHER): Payer: Medicare Other

## 2021-02-07 ENCOUNTER — Other Ambulatory Visit: Payer: Self-pay

## 2021-02-07 DIAGNOSIS — Z23 Encounter for immunization: Secondary | ICD-10-CM | POA: Diagnosis not present

## 2021-03-05 DIAGNOSIS — H353211 Exudative age-related macular degeneration, right eye, with active choroidal neovascularization: Secondary | ICD-10-CM | POA: Diagnosis not present

## 2021-05-22 DIAGNOSIS — H353211 Exudative age-related macular degeneration, right eye, with active choroidal neovascularization: Secondary | ICD-10-CM | POA: Diagnosis not present

## 2021-06-06 DIAGNOSIS — R001 Bradycardia, unspecified: Secondary | ICD-10-CM | POA: Diagnosis not present

## 2021-06-06 DIAGNOSIS — I480 Paroxysmal atrial fibrillation: Secondary | ICD-10-CM | POA: Diagnosis not present

## 2021-06-06 DIAGNOSIS — E782 Mixed hyperlipidemia: Secondary | ICD-10-CM | POA: Diagnosis not present

## 2021-07-04 ENCOUNTER — Ambulatory Visit: Payer: Self-pay | Admitting: Family Medicine

## 2021-07-04 NOTE — Progress Notes (Deleted)
Established patient visit   Patient: Darlene Serrano   DOB: [birth year]   87 y.o. Female  MRN: [id number] Visit Date: 07/04/2021  Today's healthcare provider: Wilhemena Durie, MD   No chief complaint on file.  Subjective    HPI  Lipid/Cholesterol, Follow-up  Last lipid panel Other pertinent labs  Lab Results  Component Value Date   CHOL 163 06/19/2020   HDL 63 06/19/2020   LDLCALC 86 06/19/2020   TRIG 75 06/19/2020   CHOLHDL 2.0 08/04/2017   Lab Results  Component Value Date   ALT 15 06/19/2020   AST 25 06/19/2020   PLT 202 01/21/2019   TSH 2.440 06/19/2020     She was last seen for this 1 years ago.  Management since that visit includes; labs checked showing-normal.  She reports {excellent/good/fair/poor:19665} compliance with treatment. She {is/is not:9024} having side effects. {document side effects if present:1}  Current diet: {diet habits:16563} Current exercise: {exercise types:16438}  The ASCVD Risk score (Arnett DK, et al., 2019) failed to calculate for the following reasons:   The 2019 ASCVD risk score is only valid for ages 52 to 68  --------------------------------------------------------------------------------------------------- Hypertension, follow-up  BP Readings from Last 3 Encounters:  01/02/21 (!) 145/72  06/19/20 137/72  02/08/20 114/72   Wt Readings from Last 3 Encounters:  01/02/21 119 lb (54 kg)  06/19/20 122 lb (55.3 kg)  02/08/20 121 lb 9.6 oz (55.2 kg)     She was last seen for hypertension 6 months ago.  BP at that visit was 145/72.  Management since that visit includes; on metoprolol.  She reports {excellent/good/fair/poor:19665} compliance with treatment. She {is/is not:9024} having side effects. {document side effects if present:1} She is following a {diet:21022986} diet. She {is/is not:9024} exercising. She {does/does not:200015} smoke.  Use of agents associated with hypertension: {bp agents assoc with  hypertension:511::"none"}.   Outside blood pressures are {***enter patient reported home BP readings, or 'not being checked':1}.  Pertinent labs: Lab Results  Component Value Date   CHOL 163 06/19/2020   HDL 63 06/19/2020   LDLCALC 86 06/19/2020   TRIG 75 06/19/2020   CHOLHDL 2.0 08/04/2017   Lab Results  Component Value Date   NA 141 06/19/2020   K 4.2 06/19/2020   CREATININE 0.89 06/19/2020   GFRNONAA 60 06/19/2020   GLUCOSE 99 06/19/2020   TSH 2.440 06/19/2020     The ASCVD Risk score (Arnett DK, et al., 2019) failed to calculate for the following reasons:   The 2019 ASCVD risk score is only valid for ages 57 to 25   ---------------------------------------------------------------------------------------------------   Medications: Outpatient Medications Prior to Visit  Medication Sig   ALLERGY 10 MG tablet TAKE 1 TABLET BY MOUTH DAILY   atorvastatin (LIPITOR) 10 MG tablet Take 1 tablet (10 mg total) by mouth every morning.   calcium carbonate (OS-CAL) 600 MG TABS tablet Take 600 mg by mouth daily.    cholecalciferol (VITAMIN D) 1000 UNITS tablet VITAMIN D, 1000UNIT (Oral Tablet)  1 Every Day for 0 days  Quantity: 0.00;  Refills: 0   Ordered :01-May-2010  [name] ;  Started 13-Apr-2009 Active   doxycycline (VIBRA-TABS) 100 MG tablet Take 1 tablet (100 mg total) by mouth 2 (two) times daily. (Patient not taking: Reported on 06/19/2020)   doxycycline (VIBRA-TABS) 100 MG tablet Take 1 tablet (100 mg total) by mouth 2 (two) times daily. (Patient not taking: Reported on 06/19/2020)   ELIQUIS 5 MG TABS tablet  Fexofenadine HCl (ALLEGRA PO) Take by mouth.   flecainide (TAMBOCOR) 50 MG tablet Take by mouth.   fluticasone (FLONASE) 50 MCG/ACT nasal spray Place into the nose.   furosemide (LASIX) 20 MG tablet TAKE ONE TABLET EVERY OTHER DAY   metoprolol succinate (TOPROL-XL) 25 MG 24 hr tablet Take by mouth.   Multiple Vitamins-Minerals (ICAPS PO) Take 1 tablet by mouth 2  (two) times daily.   phenazopyridine (AZO-TABS) 95 MG tablet Take 1 tablet (95 mg total) by mouth 3 (three) times daily as needed for pain. (Patient not taking: Reported on 06/19/2020)   traZODone (DESYREL) 50 MG tablet Take 1 tablet (50 mg total) by mouth at bedtime as needed for sleep. (Patient not taking: Reported on 06/19/2020)   No facility-administered medications prior to visit.    Review of Systems  Constitutional:  Negative for appetite change, chills, fatigue and fever.  Respiratory:  Negative for chest tightness and shortness of breath.   Cardiovascular:  Negative for chest pain and palpitations.  Gastrointestinal:  Negative for abdominal pain, nausea and vomiting.  Neurological:  Negative for dizziness and weakness.   {Labs   Heme   Chem   Endocrine   Serology   Results Review (optional):23779}   Objective    There were no vitals taken for this visit. {Show previous vital signs (optional):23777}  Physical Exam  ***  No results found for any visits on 07/04/21.  Assessment & Plan     ***  No follow-ups on file.      {provider attestation***:1}   Wilhemena Durie, MD  [facility] [phone] (phone) [phone] (fax)  [place]

## 2021-07-05 ENCOUNTER — Ambulatory Visit: Payer: Self-pay | Admitting: Family Medicine

## 2021-07-26 ENCOUNTER — Telehealth: Payer: Self-pay

## 2021-07-26 ENCOUNTER — Other Ambulatory Visit: Payer: Self-pay

## 2021-07-26 ENCOUNTER — Ambulatory Visit (INDEPENDENT_AMBULATORY_CARE_PROVIDER_SITE_OTHER): Payer: Medicare Other | Admitting: Family Medicine

## 2021-07-26 VITALS — BP 129/58 | HR 74 | Temp 97.6°F | Wt 120.0 lb

## 2021-07-26 DIAGNOSIS — M160 Bilateral primary osteoarthritis of hip: Secondary | ICD-10-CM | POA: Diagnosis not present

## 2021-07-26 DIAGNOSIS — I340 Nonrheumatic mitral (valve) insufficiency: Secondary | ICD-10-CM | POA: Diagnosis not present

## 2021-07-26 DIAGNOSIS — E7800 Pure hypercholesterolemia, unspecified: Secondary | ICD-10-CM

## 2021-07-26 DIAGNOSIS — F419 Anxiety disorder, unspecified: Secondary | ICD-10-CM

## 2021-07-26 DIAGNOSIS — I10 Essential (primary) hypertension: Secondary | ICD-10-CM

## 2021-07-26 DIAGNOSIS — I4891 Unspecified atrial fibrillation: Secondary | ICD-10-CM | POA: Diagnosis not present

## 2021-07-26 DIAGNOSIS — H3530 Unspecified macular degeneration: Secondary | ICD-10-CM | POA: Diagnosis not present

## 2021-07-26 NOTE — Progress Notes (Signed)
?  ? ?I,Elena D DeSanto,acting as a scribe for Wilhemena Durie, MD.,have documented all relevant documentation on the behalf of Wilhemena Durie, MD,as directed by  Wilhemena Durie, MD while in the presence of Wilhemena Durie, MD. ? ? ?Established patient visit ? ? ?Patient: Darlene Serrano   DOB: 13-Feb-1935   87 y.o. Female  MRN: [id number] ?Visit Date: 07/26/2021 ? ?Today's healthcare provider: Wilhemena Durie, MD  ? ?No chief complaint on file. ? ?Subjective  ?  ?HPI  ?Patient is an 87 year old female who presents for follow up of chronic health issues.  She has labs for Lipids, CBC and Met C ordered from February but has not yet had them drawn.   ?Her last visit in August no changes were made in medications for her Hypercholesterolemia, Palpitations, Arthritis, or Atrial Fibrillation. ?Patient has stopped driving about 2 years ago due to macular degeneration.  She has significant chronic anxiety but is doing well with that. ?She has had no recent falls. ?Medications: ?Outpatient Medications Prior to Visit  ?Medication Sig  ? ELIQUIS 5 MG TABS tablet   ? Fexofenadine HCl (ALLEGRA PO) Take by mouth.  ? furosemide (LASIX) 20 MG tablet TAKE ONE TABLET EVERY OTHER DAY  ? Multiple Vitamins-Minerals (ICAPS PO) Take 1 tablet by mouth 2 (two) times daily.  ? ALLERGY 10 MG tablet TAKE 1 TABLET BY MOUTH DAILY  ? atorvastatin (LIPITOR) 10 MG tablet Take 1 tablet (10 mg total) by mouth every morning.  ? calcium carbonate (OS-CAL) 600 MG TABS tablet Take 600 mg by mouth daily.   ? cholecalciferol (VITAMIN D) 1000 UNITS tablet VITAMIN D, 1000UNIT (Oral Tablet) ? 1 Every Day for 0 days ? Quantity: 0.00;  Refills: 0 ? ? Ordered :01-May-2010 ? [name] ;  Started 13-Apr-2009 ?Active  ? flecainide (TAMBOCOR) 50 MG tablet Take by mouth.  ? fluticasone (FLONASE) 50 MCG/ACT nasal spray Place into the nose.  ? metoprolol succinate (TOPROL-XL) 25 MG 24 hr tablet Take by mouth.  ? phenazopyridine (AZO-TABS) 95 MG  tablet Take 1 tablet (95 mg total) by mouth 3 (three) times daily as needed for pain. (Patient not taking: Reported on 06/19/2020)  ? traZODone (DESYREL) 50 MG tablet Take 1 tablet (50 mg total) by mouth at bedtime as needed for sleep. (Patient not taking: Reported on 06/19/2020)  ? [DISCONTINUED] doxycycline (VIBRA-TABS) 100 MG tablet Take 1 tablet (100 mg total) by mouth 2 (two) times daily. (Patient not taking: Reported on 06/19/2020)  ? [DISCONTINUED] doxycycline (VIBRA-TABS) 100 MG tablet Take 1 tablet (100 mg total) by mouth 2 (two) times daily. (Patient not taking: Reported on 06/19/2020)  ? ?No facility-administered medications prior to visit.  ? ? ?Review of Systems  ?Respiratory:  Negative for shortness of breath.   ?Cardiovascular:  Negative for chest pain, palpitations and leg swelling.  ?Gastrointestinal:  Negative for abdominal pain.  ? ?  ?  Objective  ?  ?BP (!) 129/58 (BP Location: Left Arm, Patient Position: Sitting, Cuff Size: Normal)   Pulse 74   Temp 97.6 ?F (36.4 ?C) (Oral)   Wt 120 lb (54.4 kg)   SpO2 97%   BMI 19.97 kg/m?  ?BP Readings from Last 3 Encounters:  ?07/26/21 (!) 129/58  ?01/02/21 (!) 145/72  ?06/19/20 137/72  ? ?Wt Readings from Last 3 Encounters:  ?07/26/21 120 lb (54.4 kg)  ?01/02/21 119 lb (54 kg)  ?06/19/20 122 lb (55.3 kg)  ? ?  ? ?Physical  Exam ?Vitals reviewed.  ?Constitutional:   ?   Appearance: She is well-developed.  ?HENT:  ?   Head: Normocephalic and atraumatic.  ?   Right Ear: Hearing, tympanic membrane and external ear normal.  ?   Left Ear: External ear normal.  ?   Mouth/Throat:  ?   Mouth: Mucous membranes are moist.  ?   Pharynx: Oropharynx is clear.  ?Eyes:  ?   General: No scleral icterus. ?   Conjunctiva/sclera: Conjunctivae normal.  ?   Pupils: Pupils are equal, round, and reactive to light.  ?Cardiovascular:  ?   Rate and Rhythm: Normal rate and regular rhythm.  ?Pulmonary:  ?   Effort: Pulmonary effort is normal.  ?Abdominal:  ?   General: Abdomen is flat.  Bowel sounds are normal.  ?   Palpations: Abdomen is soft.  ?   Tenderness: There is no abdominal tenderness.  ?Musculoskeletal:  ?   Cervical back: Normal range of motion and neck supple.  ?Skin: ?   General: Skin is warm and dry.  ?Neurological:  ?   General: No focal deficit present.  ?   Mental Status: She is alert and oriented to person, place, and time.  ?Psychiatric:     ?   Mood and Affect: Mood normal.     ?   Behavior: Behavior normal.     ?   Thought Content: Thought content normal.     ?   Judgment: Judgment normal.  ?  ? ? ?No results found for any visits on 07/26/21. ? Assessment & Plan  ?  ? ?1. Hypercholesterolemia ?On atorvastatin 10 ?- CBC with Differential/Platelet ?- Comprehensive metabolic panel ?- Lipid Panel With LDL/HDL Ratio ?- TSH ? ?2. Hypertension, unspecified type ?Good blood pressures.  He has been on furosemide through the years fluid retention. ?- CBC with Differential/Platelet ?- Comprehensive metabolic panel ?- TSH ? ?3. Macular degeneration, unspecified laterality, unspecified type ?No longer driving. ?- CBC with Differential/Platelet ?- Comprehensive metabolic panel ?- TSH ? ?4. Nonrheumatic mitral valve regurgitation ? ? ?5. Atrial fibrillation, unspecified type ([place]) ?On Eliquis ? ?6. Primary osteoarthritis of both hips ? ? ?7. Anxiety ?Chronic problem but patient is coping well. ? ? ?No follow-ups on file.  ?   ? ?I, Wilhemena Durie, MD, have reviewed all documentation for this visit. The documentation on 07/28/21 for the exam, diagnosis, procedures, and orders are all accurate and complete. ? ? ? ?Rodrigus Kilker Cranford Mon, MD  ?[facility] ?[phone] (phone) ?[phone] (fax) ? ?[facility] ?

## 2021-07-26 NOTE — Telephone Encounter (Signed)
Copied from [place] [id number]. Topic: General - Other ?>> Jul 26, 2021  3:13 PM Leward Quan A wrote: ?Reason for CRM: Patient called in to inform Dr Rosanna Randy nurse from her visit today. Per patient nurse was right but Patient would like a call back please at Ph# [phone] ?

## 2021-07-27 DIAGNOSIS — H3530 Unspecified macular degeneration: Secondary | ICD-10-CM | POA: Diagnosis not present

## 2021-07-27 DIAGNOSIS — E7800 Pure hypercholesterolemia, unspecified: Secondary | ICD-10-CM | POA: Diagnosis not present

## 2021-07-27 DIAGNOSIS — I10 Essential (primary) hypertension: Secondary | ICD-10-CM | POA: Diagnosis not present

## 2021-07-28 LAB — CBC WITH DIFFERENTIAL/PLATELET
Basophils Absolute: 0.1 10*3/uL (ref 0.0–0.2)
Basos: 1 %
EOS (ABSOLUTE): 0.2 10*3/uL (ref 0.0–0.4)
Eos: 3 %
Hematocrit: 40.4 % (ref 34.0–46.6)
Hemoglobin: 13.5 g/dL (ref 11.1–15.9)
Immature Grans (Abs): 0 10*3/uL (ref 0.0–0.1)
Immature Granulocytes: 0 %
Lymphocytes Absolute: 1.4 10*3/uL (ref 0.7–3.1)
Lymphs: 23 %
MCH: 32.1 pg (ref 26.6–33.0)
MCHC: 33.4 g/dL (ref 31.5–35.7)
MCV: 96 fL (ref 79–97)
Monocytes Absolute: 0.4 10*3/uL (ref 0.1–0.9)
Monocytes: 7 %
Neutrophils Absolute: 4 10*3/uL (ref 1.4–7.0)
Neutrophils: 66 %
Platelets: 238 10*3/uL (ref 150–450)
RBC: 4.2 x10E6/uL (ref 3.77–5.28)
RDW: 12.4 % (ref 11.7–15.4)
WBC: 6 10*3/uL (ref 3.4–10.8)

## 2021-07-28 LAB — COMPREHENSIVE METABOLIC PANEL
ALT: 13 IU/L (ref 0–32)
AST: 31 IU/L (ref 0–40)
Albumin/Globulin Ratio: 2.3 — ABNORMAL HIGH (ref 1.2–2.2)
Albumin: 4.5 g/dL (ref 3.6–4.6)
Alkaline Phosphatase: 96 IU/L (ref 44–121)
BUN/Creatinine Ratio: 21 (ref 12–28)
BUN: 15 mg/dL (ref 8–27)
Bilirubin Total: 0.6 mg/dL (ref 0.0–1.2)
CO2: 21 mmol/L (ref 20–29)
Calcium: 9.3 mg/dL (ref 8.7–10.3)
Chloride: 99 mmol/L (ref 96–106)
Creatinine, Ser: 0.71 mg/dL (ref 0.57–1.00)
Globulin, Total: 2 g/dL (ref 1.5–4.5)
Glucose: 101 mg/dL — ABNORMAL HIGH (ref 70–99)
Potassium: 4.7 mmol/L (ref 3.5–5.2)
Sodium: 136 mmol/L (ref 134–144)
Total Protein: 6.5 g/dL (ref 6.0–8.5)
eGFR: 84 mL/min/{1.73_m2} (ref >59)

## 2021-07-28 LAB — LIPID PANEL WITH LDL/HDL RATIO
Cholesterol, Total: 165 mg/dL (ref 100–199)
HDL: 71 mg/dL (ref >39)
LDL Chol Calc (NIH): 76 mg/dL (ref 0–99)
LDL/HDL Ratio: 1.1 ratio (ref 0.0–3.2)
Triglycerides: 100 mg/dL (ref 0–149)
VLDL Cholesterol Cal: 18 mg/dL (ref 5–40)

## 2021-07-28 LAB — TSH: TSH: 1.98 u[IU]/mL (ref 0.450–4.500)

## 2021-08-14 DIAGNOSIS — H353211 Exudative age-related macular degeneration, right eye, with active choroidal neovascularization: Secondary | ICD-10-CM | POA: Diagnosis not present

## 2021-11-06 DIAGNOSIS — H353211 Exudative age-related macular degeneration, right eye, with active choroidal neovascularization: Secondary | ICD-10-CM | POA: Diagnosis not present

## 2021-11-12 DIAGNOSIS — C44612 Basal cell carcinoma of skin of right upper limb, including shoulder: Secondary | ICD-10-CM | POA: Diagnosis not present

## 2021-11-12 DIAGNOSIS — L821 Other seborrheic keratosis: Secondary | ICD-10-CM | POA: Diagnosis not present

## 2021-11-12 DIAGNOSIS — Z85828 Personal history of other malignant neoplasm of skin: Secondary | ICD-10-CM | POA: Diagnosis not present

## 2021-11-12 DIAGNOSIS — L570 Actinic keratosis: Secondary | ICD-10-CM | POA: Diagnosis not present

## 2021-11-12 DIAGNOSIS — L814 Other melanin hyperpigmentation: Secondary | ICD-10-CM | POA: Diagnosis not present

## 2021-11-12 DIAGNOSIS — L820 Inflamed seborrheic keratosis: Secondary | ICD-10-CM | POA: Diagnosis not present

## 2021-11-13 DIAGNOSIS — M2042 Other hammer toe(s) (acquired), left foot: Secondary | ICD-10-CM | POA: Diagnosis not present

## 2021-11-13 DIAGNOSIS — M2012 Hallux valgus (acquired), left foot: Secondary | ICD-10-CM | POA: Diagnosis not present

## 2021-12-24 DIAGNOSIS — R600 Localized edema: Secondary | ICD-10-CM | POA: Diagnosis not present

## 2021-12-24 DIAGNOSIS — I480 Paroxysmal atrial fibrillation: Secondary | ICD-10-CM | POA: Diagnosis not present

## 2021-12-24 DIAGNOSIS — Z79899 Other long term (current) drug therapy: Secondary | ICD-10-CM | POA: Diagnosis not present

## 2021-12-24 DIAGNOSIS — I5032 Chronic diastolic (congestive) heart failure: Secondary | ICD-10-CM | POA: Insufficient documentation

## 2021-12-24 DIAGNOSIS — E782 Mixed hyperlipidemia: Secondary | ICD-10-CM | POA: Diagnosis not present

## 2021-12-24 DIAGNOSIS — R001 Bradycardia, unspecified: Secondary | ICD-10-CM | POA: Diagnosis not present

## 2021-12-24 DIAGNOSIS — Z5181 Encounter for therapeutic drug level monitoring: Secondary | ICD-10-CM | POA: Diagnosis not present

## 2022-01-07 ENCOUNTER — Other Ambulatory Visit: Payer: Self-pay | Admitting: Family Medicine

## 2022-01-07 DIAGNOSIS — Z1231 Encounter for screening mammogram for malignant neoplasm of breast: Secondary | ICD-10-CM

## 2022-01-10 DIAGNOSIS — I480 Paroxysmal atrial fibrillation: Secondary | ICD-10-CM | POA: Diagnosis not present

## 2022-01-10 DIAGNOSIS — R600 Localized edema: Secondary | ICD-10-CM | POA: Diagnosis not present

## 2022-01-10 DIAGNOSIS — R001 Bradycardia, unspecified: Secondary | ICD-10-CM | POA: Diagnosis not present

## 2022-01-18 DIAGNOSIS — S32030A Wedge compression fracture of third lumbar vertebra, initial encounter for closed fracture: Secondary | ICD-10-CM | POA: Diagnosis not present

## 2022-01-18 DIAGNOSIS — R110 Nausea: Secondary | ICD-10-CM | POA: Diagnosis not present

## 2022-01-18 DIAGNOSIS — M545 Low back pain, unspecified: Secondary | ICD-10-CM | POA: Diagnosis not present

## 2022-01-18 DIAGNOSIS — M8588 Other specified disorders of bone density and structure, other site: Secondary | ICD-10-CM | POA: Diagnosis not present

## 2022-01-21 ENCOUNTER — Ambulatory Visit: Payer: Self-pay | Admitting: *Deleted

## 2022-01-21 NOTE — Telephone Encounter (Signed)
  Chief Complaint: soreness after exercise class with weights Symptoms: Saw MD for back and hip pain and they found a fx rib Frequency: soreness constant Pertinent Negatives: Patient denies fever, urine/bowel changes Disposition: '[]'$ ED /'[]'$ Urgent Care (no appt availability in office) / '[x]'$ Appointment(In office/virtual)/ '[]'$  [place] [facility]/ '[]'$ Home Care/ '[]'$ Refused Recommended Disposition /'[]'$ [place] [facility]/ '[]'$  Follow-up with PCP Additional Notes: Incidental finding of fx rib while looking at back and hip after injury in weight lifting class at residence. Appt Wednesday.   Reason for Disposition  [1] MODERATE back pain (e.g., interferes with normal activities) AND [2] present > 3 days  Answer Assessment - Initial Assessment Questions 1. ONSET: "When did the pain begin?"      1 week ago, lifting weights in a class 2. LOCATION: "Where does it hurt?" (upper, mid or lower back)     Lower back and hip 3. SEVERITY: "How bad is the pain?"  (e.g., Scale 1-10; mild, moderate, or severe)   - MILD (1-3): Doesn't interfere with normal activities.    - MODERATE (4-7): Interferes with normal activities or awakens from sleep.    - SEVERE (8-10): Excruciating pain, unable to do any normal activities.      Soreness 4. PATTERN: "Is the pain constant?" (e.g., yes, no; constant, intermittent)      constant 5. RADIATION: "Does the pain shoot into your legs or somewhere else?"     no 6. CAUSE:  "What do you think is causing the back pain?"      Hurt herself lifting weights, MD found fx rib 7. BACK OVERUSE:  "Any recent lifting of heavy objects, strenuous work or exercise?"     Yes in exercise class 8. MEDICINES: "What have you taken so far for the pain?" (e.g., nothing, acetaminophen, NSAIDS)     Rx from another md 9. NEUROLOGIC SYMPTOMS: "Do you have any weakness, numbness, or problems with bowel/bladder control?"     no 10. OTHER SYMPTOMS: "Do you have any other symptoms?" (e.g., fever,  abdomen pain, burning with urination, blood in urine)       no 11. PREGNANCY: "Is there any chance you are pregnant?" "When was your last menstrual period?"       no  Protocols used: Back Pain-A-AH

## 2022-01-23 ENCOUNTER — Telehealth: Payer: Self-pay

## 2022-01-23 ENCOUNTER — Ambulatory Visit (INDEPENDENT_AMBULATORY_CARE_PROVIDER_SITE_OTHER): Payer: Medicare Other | Admitting: Family Medicine

## 2022-01-23 ENCOUNTER — Encounter: Payer: Self-pay | Admitting: Family Medicine

## 2022-01-23 VITALS — BP 166/79 | HR 68 | Resp 16 | Wt 118.0 lb

## 2022-01-23 DIAGNOSIS — M8088XA Other osteoporosis with current pathological fracture, vertebra(e), initial encounter for fracture: Secondary | ICD-10-CM | POA: Diagnosis not present

## 2022-01-23 DIAGNOSIS — I10 Essential (primary) hypertension: Secondary | ICD-10-CM

## 2022-01-23 DIAGNOSIS — F419 Anxiety disorder, unspecified: Secondary | ICD-10-CM

## 2022-01-23 DIAGNOSIS — H3530 Unspecified macular degeneration: Secondary | ICD-10-CM

## 2022-01-23 DIAGNOSIS — E7800 Pure hypercholesterolemia, unspecified: Secondary | ICD-10-CM | POA: Diagnosis not present

## 2022-01-23 MED ORDER — ALENDRONATE SODIUM 70 MG/75ML PO SOLN: 70.0000 mg | ORAL | 11 refills | Status: DC

## 2022-01-23 NOTE — Telephone Encounter (Unsigned)
Copied from [place] [id number]. Topic: General - Other >> Jan 23, 2022  4:20 PM Chapman Fitch wrote: Reason for CRM: pt called to speak with Dr. Marlan Palau nurse today/ she would like to speak with her about her results and what she thought they would have been / please advise

## 2022-01-23 NOTE — Progress Notes (Signed)
Established patient visit  I,Darlene Serrano,acting as a scribe for Darlene Durie, MD.,have documented all relevant documentation on the behalf of Darlene Durie, MD,as directed by  Darlene Durie, MD while in the presence of Darlene Durie, MD.   Patient: Darlene Serrano   DOB: 1934-09-28   87 y.o. Female  MRN: [id number] Visit Date: 01/23/2022  Today's healthcare provider: Wilhemena Durie, MD   Chief Complaint  Patient presents with   Follow-up   Hypertension   Rib Injury   Subjective    HPI  Patient is here for fx rib. Patient was in her exercise class 1 week ago. Patient felt a twinge on her left side while lifting weights. Patient states she began having pain in left side and chest. Patient went to [facility] Friday. UC did an x-ray of her back and discovered patient fractured a rib.  From the notes it seems pt had a vertebral compression fracture. She is hurting less since her visit. Hypertension, follow-up  BP Readings from Last 3 Encounters:  01/23/22 (!) 166/79  07/26/21 (!) 129/58  01/02/21 (!) 145/72   Wt Readings from Last 3 Encounters:  01/23/22 118 lb (53.5 kg)  07/26/21 120 lb (54.4 kg)  01/02/21 119 lb (54 kg)     She was last seen for hypertension 5 months ago.  Management since that visit includes; Good blood pressures.  He has been on furosemide through the years fluid retention..  Outside blood pressures are not checking.  Pertinent labs Lab Results  Component Value Date   CHOL 165 07/27/2021   HDL 71 07/27/2021   LDLCALC 76 07/27/2021   TRIG 100 07/27/2021   CHOLHDL 2.0 08/04/2017   Lab Results  Component Value Date   NA 136 07/27/2021   K 4.7 07/27/2021   CREATININE 0.71 07/27/2021   EGFR 84 07/27/2021   GLUCOSE 101 (H) 07/27/2021   TSH 1.980 07/27/2021     The ASCVD Risk score (Arnett DK, et al., 2019) failed to calculate for the following reasons:   The 2019 ASCVD risk score is only valid for ages 37 to  54  ---------------------------------------------------------------------------------------------------   Medications: Outpatient Medications Prior to Visit  Medication Sig   ALLERGY 10 MG tablet TAKE 1 TABLET BY MOUTH DAILY   atorvastatin (LIPITOR) 10 MG tablet Take 1 tablet (10 mg total) by mouth every morning.   calcium carbonate (OS-CAL) 600 MG TABS tablet Take 600 mg by mouth daily.    cholecalciferol (VITAMIN D) 1000 UNITS tablet VITAMIN D, 1000UNIT (Oral Tablet)  1 Every Day for 0 days  Quantity: 0.00;  Refills: 0   Ordered :01-May-2010  [name] ;  Started 13-Apr-2009 Active   ELIQUIS 5 MG TABS tablet    Fexofenadine HCl (ALLEGRA PO) Take by mouth.   flecainide (TAMBOCOR) 50 MG tablet Take by mouth.   fluticasone (FLONASE) 50 MCG/ACT nasal spray Place into the nose.   furosemide (LASIX) 20 MG tablet TAKE ONE TABLET EVERY OTHER DAY   metaxalone (SKELAXIN) 800 MG tablet May take 1/2-1 whole tablet up to 3 times daily as needed for pain or spasm.   metoprolol succinate (TOPROL-XL) 25 MG 24 hr tablet Take by mouth. (Patient not taking: Reported on 01/23/2022)   Multiple Vitamins-Minerals (ICAPS PO) Take 1 tablet by mouth 2 (two) times daily.   phenazopyridine (AZO-TABS) 95 MG tablet Take 1 tablet (95 mg total) by mouth 3 (three) times daily as needed for pain. (Patient  not taking: Reported on 06/19/2020)   predniSONE (DELTASONE) 10 MG tablet Take by mouth.   traZODone (DESYREL) 50 MG tablet Take 1 tablet (50 mg total) by mouth at bedtime as needed for sleep. (Patient not taking: Reported on 06/19/2020)   No facility-administered medications prior to visit.    Review of Systems  Constitutional:  Negative for appetite change, chills, fatigue and fever.  Respiratory:  Negative for chest tightness and shortness of breath.   Cardiovascular:  Negative for chest pain and palpitations.  Gastrointestinal:  Negative for abdominal pain, nausea and vomiting.  Neurological:  Negative for  dizziness and weakness.        Objective    BP (!) 166/79 (BP Location: Right Arm, Patient Position: Sitting, Cuff Size: Normal)   Pulse 68   Resp 16   Wt 118 lb (53.5 kg)   SpO2 97%   BMI 19.64 kg/m  BP Readings from Last 3 Encounters:  01/23/22 (!) 166/79  07/26/21 (!) 129/58  01/02/21 (!) 145/72   Wt Readings from Last 3 Encounters:  01/23/22 118 lb (53.5 kg)  07/26/21 120 lb (54.4 kg)  01/02/21 119 lb (54 kg)      Physical Exam Vitals reviewed.  Constitutional:      Appearance: She is well-developed.  HENT:     Head: Normocephalic and atraumatic.     Right Ear: Hearing, tympanic membrane and external ear normal.     Left Ear: External ear normal.     Mouth/Throat:     Mouth: Mucous membranes are moist.     Pharynx: Oropharynx is clear.  Eyes:     General: No scleral icterus.    Conjunctiva/sclera: Conjunctivae normal.     Pupils: Pupils are equal, round, and reactive to light.  Cardiovascular:     Rate and Rhythm: Normal rate and regular rhythm.  Pulmonary:     Effort: Pulmonary effort is normal.     Breath sounds: Normal breath sounds.  Abdominal:     General: Bowel sounds are normal.     Palpations: Abdomen is soft.     Tenderness: There is no abdominal tenderness.  Musculoskeletal:     Cervical back: Neck supple.     Comments: No vertebral tenderness.  Skin:    General: Skin is warm and dry.  Neurological:     General: No focal deficit present.     Mental Status: She is alert and oriented to person, place, and time.  Psychiatric:        Mood and Affect: Mood normal.        Behavior: Behavior normal.        Thought Content: Thought content normal.        Judgment: Judgment normal.       No results found for any visits on 01/23/22.  Assessment & Plan     1. Osteoporotic compression fracture of vertebra, initial encounter ([place]) With alendronate for 5 years until 2028 get BMD now and in 2 years. - alendronate (FOSAMAX) 70 MG/75ML solution;  Take 75 mLs (70 mg total) by mouth every 7 (seven) days. Take with a full glass of water on an empty stomach.  Dispense: 4 mL; Refill: 11  2. Macular degeneration, unspecified laterality, unspecified type   3. Hypertension, unspecified type   4. Anxiety Consider MMSE on next visit in addition to GAD-7 and PHQ-9  5. Hypercholesteremia     No follow-ups on file.      I, Darlene Durie, MD, have  reviewed all documentation for this visit. The documentation on 01/26/22 for the exam, diagnosis, procedures, and orders are all accurate and complete.    Lauryn Lizardi Cranford Mon, MD  [facility] [phone] (phone) [phone] (fax)  [place]

## 2022-01-24 ENCOUNTER — Other Ambulatory Visit: Payer: Self-pay | Admitting: *Deleted

## 2022-01-24 DIAGNOSIS — I5032 Chronic diastolic (congestive) heart failure: Secondary | ICD-10-CM | POA: Diagnosis not present

## 2022-01-24 DIAGNOSIS — I493 Ventricular premature depolarization: Secondary | ICD-10-CM | POA: Diagnosis not present

## 2022-01-24 DIAGNOSIS — M8088XA Other osteoporosis with current pathological fracture, vertebra(e), initial encounter for fracture: Secondary | ICD-10-CM

## 2022-01-24 DIAGNOSIS — E782 Mixed hyperlipidemia: Secondary | ICD-10-CM | POA: Diagnosis not present

## 2022-01-24 DIAGNOSIS — I480 Paroxysmal atrial fibrillation: Secondary | ICD-10-CM | POA: Diagnosis not present

## 2022-01-24 NOTE — Telephone Encounter (Signed)
Tried calling patient back. Line was busy.

## 2022-01-24 NOTE — Telephone Encounter (Signed)
Per Dr. Rosanna Randy, bone density was ordered.

## 2022-01-29 DIAGNOSIS — H353211 Exudative age-related macular degeneration, right eye, with active choroidal neovascularization: Secondary | ICD-10-CM | POA: Diagnosis not present

## 2022-01-30 ENCOUNTER — Ambulatory Visit: Payer: Self-pay

## 2022-01-30 NOTE — Telephone Encounter (Signed)
Patient called in wants to speak with nurse about med, alendronate (FOSAMAX) 70 MG/75ML. She says it makes her retain fluid, so she stopped taking it. Please call back to discuss   Pt. Reports after taking Fosamax, she had fluid retention. She stopped the medicine and feels better. Is taking Calcium twice a day. Answer Assessment - Initial Assessment Questions 1. NAME of MEDICINE: "What medicine(s) are you calling about?"     Fosamax 2. QUESTION: "What is your question?" (e.g., double dose of medicine, side effect)     Had fluid retention 3. PRESCRIBER: "Who prescribed the medicine?" Reason: if prescribed by specialist, call should be referred to that group.     Dr. Rosanna Randy 4. SYMPTOMS: "Do you have any symptoms?" If Yes, ask: "What symptoms are you having?"  "How bad are the symptoms (e.g., mild, moderate, severe)     Fluid retention 5. PREGNANCY:  "Is there any chance that you are pregnant?" "When was your last menstrual period?"     No  Protocols used: Medication Question Call-A-AH

## 2022-01-30 NOTE — Telephone Encounter (Signed)
OK--pt has BMD ordered. Might need consult for osteoporosis.

## 2022-01-31 NOTE — Telephone Encounter (Signed)
Patient returned call- she actually has MM on Monday at [facility] and wants to know if they can do it there. Patient also wants to know if there is alternative medication for treatment of osteoporosis- or if PCP is wanting to wait for results of scan before deciding that.  Patient has concerns about follow up- prefers female provider and would like to know if she has option to request one at this office- would like to follow up with Dr Rosanna Randy before he leaves if possible after hr scan.

## 2022-02-01 NOTE — Telephone Encounter (Signed)
Darlene Serrano spoke with pt this morning and scheduled her BMD.

## 2022-02-01 NOTE — Telephone Encounter (Signed)
Patient was advised.  

## 2022-02-04 ENCOUNTER — Ambulatory Visit
Admission: RE | Admit: 2022-02-04 | Discharge: 2022-02-04 | Disposition: A | Discharge status: Home / Self Care | Payer: Medicare Other | Source: Ambulatory Visit | Attending: Family Medicine | Admitting: Family Medicine

## 2022-02-04 DIAGNOSIS — Z1231 Encounter for screening mammogram for malignant neoplasm of breast: Secondary | ICD-10-CM | POA: Insufficient documentation

## 2022-02-07 ENCOUNTER — Ambulatory Visit (INDEPENDENT_AMBULATORY_CARE_PROVIDER_SITE_OTHER): Payer: Medicare Other | Admitting: Family Medicine

## 2022-02-07 ENCOUNTER — Encounter: Payer: Self-pay | Admitting: Family Medicine

## 2022-02-07 VITALS — BP 137/76 | HR 80 | Resp 16 | Wt 118.0 lb

## 2022-02-07 DIAGNOSIS — Z23 Encounter for immunization: Secondary | ICD-10-CM

## 2022-02-07 DIAGNOSIS — H3530 Unspecified macular degeneration: Secondary | ICD-10-CM | POA: Diagnosis not present

## 2022-02-07 DIAGNOSIS — M8088XA Other osteoporosis with current pathological fracture, vertebra(e), initial encounter for fracture: Secondary | ICD-10-CM

## 2022-02-07 DIAGNOSIS — F419 Anxiety disorder, unspecified: Secondary | ICD-10-CM | POA: Diagnosis not present

## 2022-02-07 NOTE — Progress Notes (Unsigned)
Established patient visit  I,[name],acting as a scribe for Wilhemena Durie, MD.,have documented all relevant documentation on the behalf of Wilhemena Durie, MD,as directed by  Wilhemena Durie, MD while in the presence of Wilhemena Durie, MD.   Patient: Darlene Serrano   DOB: 1934-06-15   87 y.o. Female  MRN: [id number] Visit Date: 02/07/2022  Today's healthcare provider: Wilhemena Durie, MD   Chief Complaint  Patient presents with   Follow-up   Subjective    HPI  Patient feels much better from her vertebral compression fracture.  The pain is almost completely resolved.  She states she could not tolerate the alendronate for the osteoporosis.  Follow up for Osteoporotic compression fracture of vertebra, initial encounter [facility]):  The patient was last seen for this 2 weeks ago. Changes made at last visit include; With alendronate for 5 years until 2028 get BMD now and in 2 years.  Patient only took Fosamax for one day. She stopped taking it because she started having swelling. -----------------------------------------------------------------------------------------   Medications: Outpatient Medications Prior to Visit  Medication Sig   ALLERGY 10 MG tablet TAKE 1 TABLET BY MOUTH DAILY   atorvastatin (LIPITOR) 10 MG tablet Take 1 tablet (10 mg total) by mouth every morning.   calcium carbonate (OS-CAL) 600 MG TABS tablet Take 600 mg by mouth daily.    cholecalciferol (VITAMIN D) 1000 UNITS tablet VITAMIN D, 1000UNIT (Oral Tablet)  1 Every Day for 0 days  Quantity: 0.00;  Refills: 0   Ordered :01-May-2010  [name] ;  Started 13-Apr-2009 Active   ELIQUIS 5 MG TABS tablet    Fexofenadine HCl (ALLEGRA PO) Take by mouth.   furosemide (LASIX) 20 MG tablet TAKE ONE TABLET EVERY OTHER DAY   metaxalone (SKELAXIN) 800 MG tablet May take 1/2-1 whole tablet up to 3 times daily as needed for pain or spasm.   Multiple Vitamins-Minerals (ICAPS PO) Take 1  tablet by mouth 2 (two) times daily.   alendronate (FOSAMAX) 70 MG/75ML solution Take 75 mLs (70 mg total) by mouth every 7 (seven) days. Take with a full glass of water on an empty stomach. (Patient not taking: Reported on 02/07/2022)   [DISCONTINUED] flecainide (TAMBOCOR) 50 MG tablet Take by mouth.   [DISCONTINUED] fluticasone (FLONASE) 50 MCG/ACT nasal spray Place into the nose.   [DISCONTINUED] metoprolol succinate (TOPROL-XL) 25 MG 24 hr tablet Take by mouth. (Patient not taking: Reported on 01/23/2022)   [DISCONTINUED] phenazopyridine (AZO-TABS) 95 MG tablet Take 1 tablet (95 mg total) by mouth 3 (three) times daily as needed for pain. (Patient not taking: Reported on 06/19/2020)   [DISCONTINUED] traZODone (DESYREL) 50 MG tablet Take 1 tablet (50 mg total) by mouth at bedtime as needed for sleep. (Patient not taking: Reported on 06/19/2020)   No facility-administered medications prior to visit.    Review of Systems  Constitutional:  Negative for appetite change, chills, fatigue and fever.  Respiratory:  Negative for chest tightness and shortness of breath.   Cardiovascular:  Negative for chest pain and palpitations.  Gastrointestinal:  Negative for abdominal pain, nausea and vomiting.  Neurological:  Negative for dizziness and weakness.    Last vitamin D No results found for: "25OHVITD2", "25OHVITD3", "VD25OH"     Objective    BP 137/76 (BP Location: Right Arm, Patient Position: Sitting, Cuff Size: Small)   Pulse 80   Resp 16   Wt 118 lb (53.5 kg)   SpO2 96%  BMI 19.64 kg/m  BP Readings from Last 3 Encounters:  02/07/22 137/76  01/23/22 (!) 166/79  07/26/21 (!) 129/58   Wt Readings from Last 3 Encounters:  02/07/22 118 lb (53.5 kg)  01/23/22 118 lb (53.5 kg)  07/26/21 120 lb (54.4 kg)      Physical Exam Vitals reviewed.  Constitutional:      Appearance: She is well-developed.  HENT:     Head: Normocephalic and atraumatic.     Right Ear: Hearing, tympanic membrane  and external ear normal.     Left Ear: External ear normal.     Mouth/Throat:     Mouth: Mucous membranes are moist.     Pharynx: Oropharynx is clear.  Eyes:     General: No scleral icterus.    Conjunctiva/sclera: Conjunctivae normal.     Pupils: Pupils are equal, round, and reactive to light.  Cardiovascular:     Rate and Rhythm: Normal rate and regular rhythm.  Pulmonary:     Effort: Pulmonary effort is normal.     Breath sounds: Normal breath sounds.  Abdominal:     General: Bowel sounds are normal.     Palpations: Abdomen is soft.     Tenderness: There is no abdominal tenderness.  Musculoskeletal:     Cervical back: Neck supple.     Comments: No vertebral tenderness.  Skin:    General: Skin is warm and dry.  Neurological:     General: No focal deficit present.     Mental Status: She is alert and oriented to person, place, and time.  Psychiatric:        Mood and Affect: Mood normal.        Behavior: Behavior normal.        Thought Content: Thought content normal.        Judgment: Judgment normal.       No results found for any visits on 02/07/22.  Assessment & Plan     1. Osteoporotic compression fracture of vertebra, initial encounter [facility]) Patient states she cannot take alendronate.  MD upcoming, consider referring for Prolia type treatment  2. Need for immunization against influenza  - Flu Vaccine QUAD High Dose(Fluad)  3. Macular degeneration, unspecified laterality, unspecified type   4. Anxiety  Stable.  Patient could have some MCI, MMSE on next visit   No follow-ups on file.      I, Wilhemena Durie, MD, have reviewed all documentation for this visit. The documentation on 02/11/22 for the exam, diagnosis, procedures, and orders are all accurate and complete.    Nairi Oswald Cranford Mon, MD  [facility] [phone] (phone) [phone] (fax)  [place]

## 2022-03-13 ENCOUNTER — Ambulatory Visit
Admission: RE | Admit: 2022-03-13 | Discharge: 2022-03-13 | Disposition: A | Discharge status: Home / Self Care | Payer: Medicare Other | Source: Ambulatory Visit | Attending: Family Medicine | Admitting: Family Medicine

## 2022-03-13 DIAGNOSIS — M8088XA Other osteoporosis with current pathological fracture, vertebra(e), initial encounter for fracture: Secondary | ICD-10-CM | POA: Insufficient documentation

## 2022-03-14 ENCOUNTER — Telehealth: Payer: Self-pay

## 2022-03-14 NOTE — Telephone Encounter (Signed)
Copied from [place] [id number]. Topic: General - Inquiry >> Mar 14, 2022 12:27 PM Rosanne Ashing P wrote: Reason for CRM: Pt called asking about the bone scan she had done yesterday asking for results.  CB@  [phone]

## 2022-03-14 NOTE — Telephone Encounter (Signed)
Patient advised that it has not been signed off yet but will call when it is.

## 2022-03-15 ENCOUNTER — Other Ambulatory Visit: Payer: Self-pay | Admitting: Family Medicine

## 2022-03-15 ENCOUNTER — Ambulatory Visit: Payer: Self-pay | Admitting: *Deleted

## 2022-03-15 DIAGNOSIS — M8000XD Age-related osteoporosis with current pathological fracture, unspecified site, subsequent encounter for fracture with routine healing: Secondary | ICD-10-CM

## 2022-03-15 DIAGNOSIS — M810 Age-related osteoporosis without current pathological fracture: Secondary | ICD-10-CM | POA: Insufficient documentation

## 2022-03-15 DIAGNOSIS — M8000XA Age-related osteoporosis with current pathological fracture, unspecified site, initial encounter for fracture: Secondary | ICD-10-CM | POA: Insufficient documentation

## 2022-03-15 MED ORDER — DENOSUMAB 60 MG/ML ~~LOC~~ SOSY: 60.0000 mg | PREFILLED_SYRINGE | SUBCUTANEOUS | 1 refills | Status: DC

## 2022-03-15 NOTE — Telephone Encounter (Signed)
No dr. Is calling me about my bone density test results and I'm upset because no one was called me with the results.

## 2022-03-15 NOTE — Telephone Encounter (Signed)
I feel like I'm being ignored.    The [facility] used to be [facility] [place]" and now I feel like I'm being ignored.   Things are not the same there anymore.    I'm just asking my dr. To call me with my bone density results.  I used to be married to a GYN dr.   I know how the process works but this is the 3rd time I've called and my dr. Maida Sale hasn't called me back.  I'm upset with the way things are being run at [facility] these days.   Reason for Disposition  [1] Caller requesting NON-URGENT health information AND [2] PCP's office is the best resource  Answer Assessment - Initial Assessment Questions 1. REASON FOR CALL or QUESTION: "What is your reason for calling today?" or "How can I best help you?" or "What question do you have that I can help answer?"     This is 3rd time pt has called for for her bone density result.  See documentation notes for details.  Protocols used: Information Only Call - No Triage-A-AH

## 2022-03-15 NOTE — Telephone Encounter (Signed)
Returned patient's call.  Review results.  Patient is reporting that she is unable to take Fosamax due to swelling.  Prescribed Prolia 60 mg injection every 6 months recommended that patient call on 12/16/2021 to schedule appointment within the next month to check CMP recommended that she continue her calcium and vitamin D supplementation until that time.  [name], MD  [facility]  [phone]

## 2022-04-15 ENCOUNTER — Ambulatory Visit: Payer: Self-pay

## 2022-04-15 ENCOUNTER — Encounter: Payer: Self-pay | Admitting: Family Medicine

## 2022-04-15 ENCOUNTER — Ambulatory Visit (INDEPENDENT_AMBULATORY_CARE_PROVIDER_SITE_OTHER): Payer: Medicare Other | Admitting: Family Medicine

## 2022-04-15 VITALS — BP 172/71 | HR 88 | Temp 97.5°F | Resp 16 | Wt 120.0 lb

## 2022-04-15 DIAGNOSIS — R300 Dysuria: Secondary | ICD-10-CM

## 2022-04-15 LAB — POCT URINALYSIS DIPSTICK
Bilirubin, UA: NEGATIVE
Blood, UA: POSITIVE
Glucose, UA: NEGATIVE
Ketones, UA: POSITIVE
Leukocytes, UA: NEGATIVE
Nitrite, UA: POSITIVE
Protein, UA: NEGATIVE
Spec Grav, UA: 1.01 (ref 1.010–1.025)
Urobilinogen, UA: 0.2 E.U./dL
pH, UA: 7 (ref 5.0–8.0)

## 2022-04-15 MED ORDER — PHENAZOPYRIDINE HCL 200 MG PO TABS: 200.0000 mg | ORAL_TABLET | Freq: Three times a day (TID) | ORAL | 0 refills | Status: AC | PRN

## 2022-04-15 NOTE — Telephone Encounter (Signed)
Reviewed office visit note from today 04/15/2022 with Dr. Ky Barban  No repeated antibiotics recommended from today's visit.  Patient recommended to follow-up with results of urine culture and use Pyridium for symptom management in the meantime.  Recommend follow-up as advised in triage note.  [name], MD  [facility]

## 2022-04-15 NOTE — Progress Notes (Signed)
   SUBJECTIVE:   CHIEF COMPLAINT / HPI:   URINARY SYMPTOMS - symptom onset Friday - seen at [facility] 11/17 for same. Given Macrobid, pyridium. Urine culture contaminated, <10k colonies.  - no missed doses of abx  Dysuria: yes Urinary frequency:  unsure Urgency: no Urinary incontinence: no Foul odor: no Hematuria: no Abdominal pain: no Back pain: no Suprapubic pain/pressure: yes Flank pain: no Fever:  no Vomiting: no Relief with pyridium: yes Status: worse Previous urinary tract infection: yes Recurrent urinary tract infection: no Vaginal discharge: no Treatments attempted: antibiotics and pyridium    OBJECTIVE:   BP (!) 172/71 (BP Location: Right Arm, Patient Position: Sitting, Cuff Size: Normal)   Pulse 88   Temp (!) 97.5 F (36.4 C) (Temporal)   Resp 16   Wt 120 lb (54.4 kg)   SpO2 100%   BMI 19.97 kg/m   Gen: well appearing, in NAD Card: Reg rate Lungs: Comfortable WOB on RA Ext: WWP, no edema   ASSESSMENT/PLAN:   Dysuria UA today with nitrites, trace blood, new from previous UA on Friday. Will resend urine for culture. Continue macrobid for now and adjust regimen based on culture. Refilled pyridium. If culture negative for infection, consider other causes of bladder irritation/spasm. Did have prolia injection ~1 month ago, otherwise no other new medications, events in last few weeks. Counseled on increasing fluids, cranberry juice.    Myles Gip, DO

## 2022-04-15 NOTE — Telephone Encounter (Signed)
  Chief Complaint: medication refill Symptoms: UT I sx Frequency:  Pertinent Negatives: NA Disposition: '[]'$ ED /'[]'$ Urgent Care (no appt availability in office) / '[]'$ Appointment(In office/virtual)/ '[]'$  [place] [facility]/ '[]'$ Home Care/ '[]'$ Refused Recommended Disposition /'[]'$  Mobile Bus/ '[x]'$  Follow-up with PCP Additional Notes: pt went to UC on 04/12/22 was given Macrobid '100mg'$  BIG x 5 days. Pt has 2 days left and still having sx but also had OV today with Dr. Ky Barban. Pt states Pyridium was sent in but she didn't mention sending more abx in to her knowledge and pt states that she has 2 days left and knows she will need more still sx still present. Pt asking if can be sent to [facility] pharmacy. I advised her I would send message back and we can FU with her. Advised pt that urine culture will also take couple of days before coming back but would FU then as well. Pt verbalized understanding.   Summary: request for med   Patient called in asking for Nitrofurantoin just had an appt today.         Reason for Disposition  [1] Prescription refill request for NON-ESSENTIAL medicine (i.e., no harm to patient if med not taken) AND [2] triager unable to refill per department policy  Answer Assessment - Initial Assessment Questions 1. DRUG NAME: "What medicine do you need to have refilled?"     Macrobid '100mg'$  BID  2. REFILLS REMAINING: "How many refills are remaining?" (Note: The label on the medicine or pill bottle will show how many refills are remaining. If there are no refills remaining, then a renewal may be needed.)     0 3. EXPIRATION DATE: "What is the expiration date?" (Note: The label states when the prescription will expire, and thus can no longer be refilled.)     0 4. PRESCRIBING HCP: "Who prescribed it?" Reason: If prescribed by specialist, call should be referred to that group.     UC provider  5. SYMPTOMS: "Do you have any symptoms?"     UTI sx  Protocols used: Medication  Refill and Renewal Call-A-AH

## 2022-04-17 ENCOUNTER — Telehealth: Payer: Self-pay | Admitting: Family Medicine

## 2022-04-17 ENCOUNTER — Ambulatory Visit (INDEPENDENT_AMBULATORY_CARE_PROVIDER_SITE_OTHER): Payer: Medicare Other | Admitting: Physician Assistant

## 2022-04-17 ENCOUNTER — Ambulatory Visit: Payer: Self-pay | Admitting: *Deleted

## 2022-04-17 ENCOUNTER — Encounter: Payer: Self-pay | Admitting: Physician Assistant

## 2022-04-17 VITALS — Wt 120.0 lb

## 2022-04-17 DIAGNOSIS — U071 COVID-19: Secondary | ICD-10-CM | POA: Diagnosis not present

## 2022-04-17 LAB — CULTURE, URINE COMPREHENSIVE

## 2022-04-17 MED ORDER — MOLNUPIRAVIR EUA 200MG CAPSULE: 4.0000 | ORAL_CAPSULE | Freq: Two times a day (BID) | ORAL | 0 refills | Status: AC

## 2022-04-17 NOTE — Progress Notes (Unsigned)
Virtual telephone visit    Virtual Visit via Telephone Note   This format is felt to be most appropriate for this patient at this time. The patient did not have access to video technology or had technical difficulties with video requiring transitioning to audio format only (telephone). Physical exam was limited to content and character of the telephone converstion.    Patient location: home Provider location: [facility]  I discussed the limitations of evaluation and management by telemedicine and the availability of in person appointments. The patient expressed understanding and agreed to proceed.   Visit Date: 04/17/2022  Today's healthcare provider: Mardene Speak, PA-C   I,Jasline Buskirk J Lydia Toren,acting as a scribe for [employer], PA-C.,have documented all relevant documentation on the behalf of Mardene Speak, PA-C,as directed by  Mardene Speak, PA-C while in the presence of [employer], PA-C.   Chief Complaint  Patient presents with   Covid Positive   Subjective    HPI  Covid Positive/Upper respiratory symptoms She complains of achiness, congestion, and malaise . Onset of symptoms was  4 days ago  and gradually worsening.She is not drinking much, and has no appetite. Tested positive for covid today, is also being treated for UTI. Facility nurse where she lives suggested she get antiviral from PCP.  ---------------------------------------------------------------------------------------------------     Medications: Outpatient Medications Prior to Visit  Medication Sig   ALLERGY 10 MG tablet TAKE 1 TABLET BY MOUTH DAILY   atorvastatin (LIPITOR) 10 MG tablet Take 1 tablet (10 mg total) by mouth every morning.   calcium carbonate (OS-CAL) 600 MG TABS tablet Take 600 mg by mouth daily.    cholecalciferol (VITAMIN D) 1000 UNITS tablet VITAMIN D, 1000UNIT (Oral Tablet)  1 Every Day for 0 days  Quantity: 0.00;  Refills: 0   Ordered :01-May-2010  [name] ;  Started  13-Apr-2009 Active   denosumab (PROLIA) 60 MG/ML SOSY injection Inject 60 mg into the skin every 6 (six) months.   ELIQUIS 5 MG TABS tablet    Fexofenadine HCl (ALLEGRA PO) Take by mouth.   furosemide (LASIX) 20 MG tablet TAKE ONE TABLET EVERY OTHER DAY   metaxalone (SKELAXIN) 800 MG tablet May take 1/2-1 whole tablet up to 3 times daily as needed for pain or spasm.   metoprolol tartrate (LOPRESSOR) 25 MG tablet Take 25 mg by mouth 2 (two) times daily.   Multiple Vitamins-Minerals (ICAPS PO) Take 1 tablet by mouth 2 (two) times daily.   phenazopyridine (PYRIDIUM) 200 MG tablet Take 1 tablet (200 mg total) by mouth 3 (three) times daily as needed for up to 3 days for pain.   No facility-administered medications prior to visit.    Review of Systems  Constitutional:  Positive for fatigue.  HENT:  Positive for congestion.   Respiratory:  Positive for cough and chest tightness.   Psychiatric/Behavioral:  Positive for agitation and confusion.        Objective    Wt 120 lb (54.4 kg)   BMI 19.97 kg/m    Physical Exam  Constitutional: She appears acutely ill.  Neurological: She is alert and oriented to person, place, and time.      Assessment & Plan     1. COVID-19 X 4 days, within 5 days of symptoms onset Acute URI. Positive for Covid-19. We reviewed pt's medication list.  Considering drug interactions between her current medications and paxlovid and pt being confused about her current medications, the following drug was sent to pt's local pharmacy -  molnupiravir EUA (LAGEVRIO) 200 mg CAPS capsule; Take 4 capsules (800 mg total) by mouth 2 (two) times daily for 5 days.  Dispense: 40 capsule; Refill: 0 Continue drink a lot of fluids. Rest.  Return precautions were discussed Masking and isolation were recommended  RTC PRN    I discussed the assessment and treatment plan with the patient and her daughter via conference call. The patient was provided an opportunity to ask  questions and all were answered. The patient agreed with the plan and demonstrated an understanding of the instructions.   The patient was advised to call back or seek an in-person evaluation if the symptoms worsen or if the condition fails to improve as anticipated.  I provided 15 minutes of non-face-to-face time with patient and her daughter [place] during this encounter.  The entirety of the information documented in the History of Present Illness, Review of Systems and Physical Exam were personally obtained by me. Portions of this information were initially documented by the CMA and reviewed by me for thoroughness and accuracy.   Mardene Speak, [facility], [place] [phone] (phone) [phone] (fax)

## 2022-04-17 NOTE — Telephone Encounter (Signed)
Pt is calling to check on the request for Antiviral Please advise

## 2022-04-17 NOTE — Telephone Encounter (Signed)
Darlene Serrano has agreed to complete telephone visit with patient to discuss medications and paxlovid  Our team will work to schedule for same day visit  [name], MD  [facility]

## 2022-04-17 NOTE — Telephone Encounter (Signed)
Pts daughter [place] called to request Paxlovid / she stated that The pt was advised to call pcp for paxlovid by the nurse at [facility] / her contact [phone]/please advise

## 2022-04-17 NOTE — Telephone Encounter (Signed)
Summary: Covid positive   Pt has tested positive for Covid; declined to answer when asked to detail her symptoms Best contact: [phone]           Chief Complaint: tested positive for covid today at [facility] requesting medication  Symptoms: chills, fever not sleeping well Frequency: 1-2 days ago  Pertinent Negatives: Patient denies chest pain no difficulty breathing no headache no cough no runny nose no sore throat Disposition: '[]'$ ED /'[]'$ Urgent Care (no appt availability in office) / '[]'$ Appointment(In office/virtual)/  [place] Virtual [facility]/ '[]'$ Home Care/ '[]'$ Refused Recommended Dis'[]'$ position /'[]'$ [place] Mobile Bus/ '[x]'$  Follow-up with PCP Additional Notes:   Patient requesting antiviral per nurse at [facility] due to testing positive for covid today . Patient reluctant to give much information regarding sx. Requesting medication to be sent to pharmacy so it can be delivered to [facility]. Patient "shocked" she test positive for covid. Did not want appt.       Reason for Disposition  [1] HIGH RISK patient (e.g., weak immune system, age > 52 years, obesity with BMI 30 or higher, pregnant, chronic lung disease or other chronic medical condition) AND [2] COVID symptoms (e.g., cough, fever)  (Exceptions: Already seen by PCP and no new or worsening symptoms.)  Answer Assessment - Initial Assessment Questions 1. COVID-19 DIAGNOSIS: "How do you know that you have COVID?" (e.g., positive lab test or self-test, diagnosed by doctor or NP/PA, symptoms after exposure).     Positive covid test noted per patient at [facility] 2. COVID-19 EXPOSURE: "Was there any known exposure to COVID before the symptoms began?" CDC Definition of close contact: within 6 feet (2 meters) for a total of 15 minutes or more over a 24-hour period.      na 3. ONSET: "When did the COVID-19 symptoms start?"      1-2 days ago  4. WORST SYMPTOM: "What is your worst symptom?" (e.g., cough, fever, shortness of breath,  muscle aches)     Chills , fever, not sleeping well.  5. COUGH: "Do you have a cough?" If Yes, ask: "How bad is the cough?"       no 6. FEVER: "Do you have a fever?" If Yes, ask: "What is your temperature, how was it measured, and when did it start?"     Yes  7. RESPIRATORY STATUS: "Describe your breathing?" (e.g., normal; shortness of breath, wheezing, unable to speak)      normal 8. BETTER-SAME-WORSE: "Are you getting better, staying the same or getting worse compared to yesterday?"  If getting worse, ask, "In what way?"     Chills today  9. OTHER SYMPTOMS: "Do you have any other symptoms?"  (e.g., chills, fatigue, headache, loss of smell or taste, muscle pain, sore throat)     Chills fever no other sx 10. HIGH RISK DISEASE: "Do you have any chronic medical problems?" (e.g., asthma, heart or lung disease, weak immune system, obesity, etc.)       See hx  11. VACCINE: "Have you had the COVID-19 vaccine?" If Yes, ask: "Which one, how many shots, when did you get it?"       na 12. PREGNANCY: "Is there any chance you are pregnant?" "When was your last menstrual period?"       na 13. O2 SATURATION MONITOR:  "Do you use an oxygen saturation monitor (pulse oximeter) at home?" If Yes, ask "What is your reading (oxygen level) today?" "What is your usual oxygen saturation reading?" (e.g., 95%)  na  Protocols used: Coronavirus (IRWER-15) Diagnosed or Suspected-A-AH

## 2022-04-17 NOTE — Telephone Encounter (Signed)
Summary: covid   Pt tested positive for covid and was advised to call pcp to see if she can be prescribed the medication for covid / pt is experiencing Coughing / sneezing and body aches / please advise         No contact with patient at this time. Patient called back to report to agent sx of cough, sneezing and body aches. Please advise is patient can be prescribed medication today .

## 2022-04-17 NOTE — Telephone Encounter (Signed)
Spoke with patient, daughter and Merlene Morse from [place]. Patient has a telephone visit with Mardene Speak today at 2:20.

## 2022-04-17 NOTE — Telephone Encounter (Signed)
Can patient get telephone appt today with a provider? Does not have My Chart access. Please advise .

## 2022-04-17 NOTE — Telephone Encounter (Signed)
See other CRM patient scheduled for today

## 2022-04-22 ENCOUNTER — Encounter: Payer: Self-pay | Admitting: Emergency Medicine

## 2022-04-22 ENCOUNTER — Emergency Department: Payer: Medicare Other

## 2022-04-22 DIAGNOSIS — U071 COVID-19: Secondary | ICD-10-CM | POA: Insufficient documentation

## 2022-04-22 DIAGNOSIS — H109 Unspecified conjunctivitis: Secondary | ICD-10-CM | POA: Insufficient documentation

## 2022-04-22 DIAGNOSIS — Z859 Personal history of malignant neoplasm, unspecified: Secondary | ICD-10-CM | POA: Diagnosis not present

## 2022-04-22 DIAGNOSIS — J1282 Pneumonia due to coronavirus disease 2019: Secondary | ICD-10-CM | POA: Diagnosis not present

## 2022-04-22 DIAGNOSIS — Z96641 Presence of right artificial hip joint: Secondary | ICD-10-CM | POA: Diagnosis not present

## 2022-04-22 DIAGNOSIS — R509 Fever, unspecified: Secondary | ICD-10-CM | POA: Diagnosis present

## 2022-04-22 DIAGNOSIS — J45909 Unspecified asthma, uncomplicated: Secondary | ICD-10-CM | POA: Insufficient documentation

## 2022-04-22 NOTE — ED Triage Notes (Signed)
Pt presents via POV with complaints of COVID. Pt tested positive on Wednesday and since that time she has had increased fatigue, eye redness/drainage, and nasal congestion. Pt has been taking Tylenol and Motrin for the fever. Denies CP.

## 2022-04-23 ENCOUNTER — Emergency Department
Admission: EM | Admit: 2022-04-23 | Discharge: 2022-04-23 | Disposition: A | Discharge status: Home / Self Care | Payer: Medicare Other | Attending: Emergency Medicine | Admitting: Emergency Medicine

## 2022-04-23 DIAGNOSIS — H109 Unspecified conjunctivitis: Secondary | ICD-10-CM

## 2022-04-23 DIAGNOSIS — U071 COVID-19: Secondary | ICD-10-CM

## 2022-04-23 MED ORDER — DOXYCYCLINE MONOHYDRATE 100 MG PO TABS: 100.0000 mg | ORAL_TABLET | Freq: Two times a day (BID) | ORAL | 0 refills | Status: AC

## 2022-04-23 MED ORDER — DOXYCYCLINE HYCLATE 100 MG PO TABS
100.0000 mg | ORAL_TABLET | Freq: Once | ORAL | Status: AC
  Administered 2022-04-23: 100 mg via ORAL
  Filled 2022-04-23: qty 1

## 2022-04-23 MED ORDER — CEFDINIR 300 MG PO CAPS: 300.0000 mg | ORAL_CAPSULE | Freq: Two times a day (BID) | ORAL | 0 refills | Status: AC

## 2022-04-23 MED ORDER — CEFDINIR 300 MG PO CAPS
300.0000 mg | ORAL_CAPSULE | Freq: Two times a day (BID) | ORAL | Status: DC
  Filled 2022-04-23: qty 1

## 2022-04-23 MED ORDER — ERYTHROMYCIN 5 MG/GM OP OINT: 1.0000 | TOPICAL_OINTMENT | Freq: Four times a day (QID) | OPHTHALMIC | 0 refills | Status: AC

## 2022-04-23 NOTE — ED Notes (Signed)
Patient discharged to home per MD order. Patient in stable condition, and deemed medically cleared by ED provider for discharge. Discharge instructions reviewed with patient/family using "Teach Back"; verbalized understanding of medication education and administration, and information about follow-up care. Denies further concerns. ° °

## 2022-04-23 NOTE — Discharge Instructions (Addendum)
You have COVID-19 but it also looks like you could have a bacterial pneumonia on your x-ray.  Please take the 2 antibiotics twice a day for the next 5 days to treat this.  It also is possible that you have a bacterial infection of your eye so please use the ophthalmic ointment 4 times a day for the next 7 days.  Please follow-up with your primary doctor within the next several days for reevaluation.

## 2022-04-23 NOTE — ED Provider Notes (Signed)
[facility] Provider Note    Event Date/Time   First MD Initiated Contact with Patient 04/23/22 [phone]     (approximate)   History   Covid Positive   HPI  Darlene Serrano is a 87 y.o. female past medical history of vascular degeneration hyperlipidemia A-fib on Eliquis who presents because of fevers congestion and hoarseness.  Patient is accompanied by her daughter who assist with history.  Patient started getting sick the Wednesday before Thanksgiving, which was approximately 10 days ago.  Started with congestion and was diagnosed with COVID-19.  Started on antiviral which she finished yesterday.  Patient has had intermittent fever since then as high as 101.  She is having some cough.  Denies nausea vomiting or diarrhea.  Did develop new redness of bilateral eyes with purulent drainage as well as hoarseness in the last day.  She still tolerating p.o.  Daughter says that she was a little bit more confused 2 days ago but is now back to baseline.  Patient has no urinary symptoms.   Patient denies visual change or eye pain  No sick contacts that she is aware of  Past Medical History:  Diagnosis Date   Arthritis    RIGHT HIP AND HANDS;  DDD   Asthma 08/30/2010   Cancer ([place])    LOCAL EXCISION SKIN CA FROM NOSE  06/11/13   History of palpitations    YEARS AGO   Impaired hearing    Macular degeneration of both eyes    HX OF INJECTIONS INTO LEFT EYE ( ? AVASTIN ) FROM OCT 2012 TO OCT 2014.   Osteoporosis    Pure hypercholesterolemia    Unspecified vitamin D deficiency     Patient Active Problem List   Diagnosis Date Noted   Age-related osteoporosis with current pathological fracture 03/15/2022   MR (mitral regurgitation) 02/13/2016   Cough 06/12/2015   CN (constipation) 01/25/2015   Sinusitis, acute 01/25/2015   Malaise and fatigue 10/29/2013   Allergic rhinitis 07/08/2013   Status post right hip replacement 07/06/2013   Postoperative anemia due to acute  blood loss 07/01/2013   OA (osteoarthritis) of hip 06/29/2013   Osteoarthritis (arthritis due to wear and tear of joints) 04/13/2013   Rapid palpitations 09/30/2012   Hypercholesterolemia 08/30/2010   Asthma 08/30/2010   Macular degeneration 08/30/2010   Acid reflux 04/13/2009   Hypercholesteremia 04/13/2009   Auditory vertigo 04/13/2009   Arthritis, degenerative 04/13/2009     Physical Exam  Triage Vital Signs: ED Triage Vitals  Enc Vitals Group     BP 04/22/22 2012 (!) 101/57     Pulse Rate 04/22/22 2012 (!) 118     Resp 04/22/22 2012 16     Temp 04/22/22 2013 98.6 F (37 C)     Temp Source 04/22/22 2012 Oral     SpO2 04/22/22 2012 94 %     Weight 04/22/22 2016 121 lb 14.6 oz (55.3 kg)     Height 04/22/22 2016 '5\' 5"'$  (1.651 m)     Head Circumference --      Peak Flow --      Pain Score --      Pain Loc --      Pain Edu? --      Excl. in [place]? --     Most recent vital signs: Vitals:   04/22/22 2013 04/23/22 0336  BP:  106/80  Pulse:  90  Resp:  20  Temp: 98.6 F (37 C) 98.4  F (36.9 C)  SpO2:  100%     General: Awake, no distress.  Patient's voice is hoarse CV:  Good peripheral perfusion.  Resp:  Normal effort.  Lungs are clear Abd:  No distention.  Abdomen is soft nontender Neuro:             Awake, Alert, Oriented x 3  Other:  Bilateral conjunctival injection with purulent discharge and crusting PERRLA   ED Results / Procedures / Treatments  Labs (all labs ordered are listed, but only abnormal results are displayed) Labs Reviewed - No data to display   EKG     RADIOLOGY I reviewed and interpreted the x-ray which shows left-sided infiltrate with effusion   PROCEDURES:  Critical Care performed: No  Procedures  The patient is on the cardiac monitor to evaluate for evidence of arrhythmia and/or significant heart rate changes.   MEDICATIONS ORDERED IN ED: Medications  cefdinir (OMNICEF) capsule 300 mg (has no administration in time range)   doxycycline (VIBRA-TABS) tablet 100 mg (100 mg Oral Given 04/23/22 0523)     IMPRESSION / MDM / ASSESSMENT AND PLAN / ED COURSE  I reviewed the triage vital signs and the nursing notes.                              Patient's presentation is most consistent with acute complicated illness / injury requiring diagnostic workup.  Differential diagnosis includes, but is not limited to, COVID-pneumonia, pneumonia from bacterial superinfection, atelectasis, pulmonary embolism  The patient is an 87 year old female who is relatively healthy and independent who presents with cough and ongoing fevers after being diagnosed with COVID about 10 days ago.  She started with viral upper respiratory symptoms about 10 days ago and was diagnosed with COVID.  Was put on antiviral.  She has had ongoing fever since that time.  Has had persistent cough as well and has now developed some hoarseness and conjunctival injection and eye drainage.  Her daughter thought she was somewhat confused yesterday.  Patient has reassuring vital signs she does have significant conjunctival injection and significant lid drainage and crusting.  She also is hoarse but is not in respiratory distress her lungs are clear and she is alert and oriented and is not confused.  Initially she is tachycardic but this resolved without intervention.  My evaluation she is satting 100% on room air.  Patient's chest x-ray does show a questionable left-sided infiltrate with pleural effusion.  Given that it has been 10 days since her diagnosis of COVID and she is still spiking fevers I am concerned for bacterial superinfection.  Patient was quite adamant that she feels well and does not want to be admitted or have further workup here.  Given she is not hypoxic with stable vital signs is not altered and is tolerating p.o. I think this is appropriate.  Will treat her with outpatient antibiotics for bacterial Including Augmentin and doxycycline.  I have also covered  her with erythromycin ophthalmic ointment for bacterial conjunctivitis.  Strict return precautions were provided both to her and her daughter.       FINAL CLINICAL IMPRESSION(S) / ED DIAGNOSES   Final diagnoses:  Pneumonia due to COVID-19 virus  Conjunctivitis of both eyes, unspecified conjunctivitis type     Rx / DC Orders   ED Discharge Orders          Ordered    cefdinir (OMNICEF) 300 MG capsule  2 times daily        04/23/22 0504    doxycycline (ADOXA) 100 MG tablet  2 times daily        04/23/22 0504    erythromycin ophthalmic ointment  4 times daily        04/23/22 2179             Note:  This document was prepared using Dragon voice recognition software and may include unintentional dictation errors.   Rada Hay, MD 04/23/22 [phone]

## 2022-04-30 ENCOUNTER — Telehealth: Payer: Self-pay

## 2022-04-30 NOTE — Telephone Encounter (Signed)
        Patient  visited [place] on 11/28     Telephone encounter attempt :  1st  A HIPAA compliant voice message was left requesting a return call.  Instructed patient to call back     [facility]  [phone] [address], [place], [place] [address] Phone: [phone] Email: [email or web address]

## 2022-05-01 ENCOUNTER — Telehealth: Payer: Self-pay

## 2022-05-01 NOTE — Telephone Encounter (Signed)
     Patient  visit on 11/28  at [place] [facility]   Have you been able to follow up with your primary care physician? Na   The patient was or was not able to obtain any needed medicine or equipment. Na   Are there diet recommendations that you are having difficulty following? Na   Patient expresses understanding of discharge instructions and education provided has no other needs at this time.  Na  Patient refused  call     [facility]  [phone] [address], [place], [place] [address] Phone: [phone] Email: [email or web address]

## 2022-05-03 ENCOUNTER — Ambulatory Visit (INDEPENDENT_AMBULATORY_CARE_PROVIDER_SITE_OTHER): Payer: Medicare Other | Admitting: Physician Assistant

## 2022-05-03 ENCOUNTER — Encounter: Payer: Self-pay | Admitting: Physician Assistant

## 2022-05-03 VITALS — BP 107/86 | HR 97 | Temp 97.9°F | Resp 17 | Ht 65.0 in | Wt 112.0 lb

## 2022-05-03 DIAGNOSIS — J0110 Acute frontal sinusitis, unspecified: Secondary | ICD-10-CM

## 2022-05-03 MED ORDER — LEVOFLOXACIN 500 MG PO TABS: 500.0000 mg | ORAL_TABLET | Freq: Every day | ORAL | 0 refills | Status: AC

## 2022-05-03 NOTE — Progress Notes (Signed)
Established patient visit   Patient: Darlene Serrano   DOB: Feb 21, 1935   87 y.o. Female  MRN: [id number] Visit Date: 05/03/2022  Today's healthcare provider: Mikey Kirschner, PA-C   I,Tiffany J Bragg,acting as a scribe for Mikey Kirschner, PA-C.,have documented all relevant documentation on the behalf of Mikey Kirschner, PA-C,as directed by  Mikey Kirschner, PA-C while in the presence of Mikey Kirschner, PA-C.   Chief Complaint  Patient presents with   Cough    Patient complains of continued cough, sore throat, and congestion since having covid 3 weeks ago.    Subjective    HPI Pt was seen in the ED 04/23/22 and dx with COVID 19 pneumonia. Treated with doxycycline. She reports worsened sinus symptoms, headaches, fatigue. She reports improved cough, denies shortness of breath, wheezing. Denies fever.  Medications: Outpatient Medications Prior to Visit  Medication Sig   ALLERGY 10 MG tablet TAKE 1 TABLET BY MOUTH DAILY   atorvastatin (LIPITOR) 10 MG tablet Take 1 tablet (10 mg total) by mouth every morning.   calcium carbonate (OS-CAL) 600 MG TABS tablet Take 600 mg by mouth daily.    cholecalciferol (VITAMIN D) 1000 UNITS tablet VITAMIN D, 1000UNIT (Oral Tablet)  1 Every Day for 0 days  Quantity: 0.00;  Refills: 0   Ordered :01-May-2010  [name] ;  Started 13-Apr-2009 Active   denosumab (PROLIA) 60 MG/ML SOSY injection Inject 60 mg into the skin every 6 (six) months.   ELIQUIS 5 MG TABS tablet    Fexofenadine HCl (ALLEGRA PO) Take by mouth.   furosemide (LASIX) 20 MG tablet TAKE ONE TABLET EVERY OTHER DAY   metaxalone (SKELAXIN) 800 MG tablet May take 1/2-1 whole tablet up to 3 times daily as needed for pain or spasm.   metoprolol tartrate (LOPRESSOR) 25 MG tablet Take 25 mg by mouth 2 (two) times daily.   Multiple Vitamins-Minerals (ICAPS PO) Take 1 tablet by mouth 2 (two) times daily.   No facility-administered medications prior to visit.    Review of Systems   Constitutional:  Positive for fatigue. Negative for fever.  HENT:  Positive for congestion, sinus pressure and sinus pain.   Respiratory:  Negative for cough and shortness of breath.   Cardiovascular:  Negative for chest pain and leg swelling.  Gastrointestinal:  Negative for abdominal pain.  Neurological:  Positive for headaches. Negative for dizziness.       Objective    BP 107/86 (BP Location: Right Arm, Patient Position: Sitting, Cuff Size: Normal)   Pulse 97   Temp 97.9 F (36.6 C) (Oral)   Resp 17   Ht '5\' 5"'$  (1.651 m)   Wt 112 lb (50.8 kg)   SpO2 98%   BMI 18.64 kg/m    Physical Exam Constitutional:      General: She is awake.     Appearance: She is well-developed.  HENT:     Head: Normocephalic.     Right Ear: Tympanic membrane normal.     Left Ear: Tympanic membrane normal.  Eyes:     Conjunctiva/sclera: Conjunctivae normal.  Cardiovascular:     Rate and Rhythm: Normal rate and regular rhythm.     Heart sounds: Normal heart sounds.  Pulmonary:     Effort: Pulmonary effort is normal. No respiratory distress.     Breath sounds: Normal breath sounds. No wheezing, rhonchi or rales.  Skin:    General: Skin is warm.  Neurological:     Mental Status: She  is alert and oriented to person, place, and time.  Psychiatric:        Attention and Perception: Attention normal.        Mood and Affect: Mood normal.        Speech: Speech normal.        Behavior: Behavior is cooperative.     No results found for any visits on 05/03/22.  Assessment & Plan     Acute sinusitis Severe all to pcn, pt lives alone and is hesitant against keflex for potential cross reaction Rx levaquin 500 mg x 7 days.  Increase fluids ,rest, tylenol  Return if symptoms worsen or fail to improve.      I, Mikey Kirschner, PA-C have reviewed all documentation for this visit. The documentation on  05/03/2022  for the exam, diagnosis, procedures, and orders are all accurate and  complete.  Mikey Kirschner, PA-C [facility] [address] [place], [place], [address] Office: [phone] Fax: [place]

## 2022-05-08 NOTE — Progress Notes (Unsigned)
I,Darlene Serrano,acting as a scribe for [employer], MD.,have documented all relevant documentation on the behalf of Darlene Foster, MD,as directed by  Darlene Foster, MD while in the presence of Darlene Foster, MD.   Established patient visit   Patient: Darlene Serrano   DOB: July 08, 1934   86 y.o. Female  MRN: [id number] Visit Date: 05/10/2022  Today's healthcare provider: [name], MD   Chief Complaint  Patient presents with   Follow-up   Subjective    HPI   Follow up for Sinusitis and COVID infection.   Patient was evaluated in the ED for COVID 04/23/22  She was later treated in office for sinusitis with 7-day course of Levaquin on 05/03/2022  Today she states overall feeling much better.Finished Levaquin yesterday.Still has scratchy throat and has not been able to get her voice back as of yet. She denies feeling SOB, muscle aches, fevers or chills today     Medications: Outpatient Medications Prior to Visit  Medication Sig   ALLERGY 10 MG tablet TAKE 1 TABLET BY MOUTH DAILY   atorvastatin (LIPITOR) 10 MG tablet Take 1 tablet (10 mg total) by mouth every morning.   calcium carbonate (OS-CAL) 600 MG TABS tablet Take 600 mg by mouth daily.    cholecalciferol (VITAMIN D) 1000 UNITS tablet VITAMIN D, 1000UNIT (Oral Tablet)  1 Every Day for 0 days  Quantity: 0.00;  Refills: 0   Ordered :01-May-2010  Darlene Serrano ;  Started 13-Apr-2009 Active   denosumab (PROLIA) 60 MG/ML SOSY injection Inject 60 mg into the skin every 6 (six) months.   ELIQUIS 5 MG TABS tablet    Fexofenadine HCl (ALLEGRA PO) Take by mouth.   furosemide (LASIX) 20 MG tablet TAKE ONE TABLET EVERY OTHER DAY   levofloxacin (LEVAQUIN) 500 MG tablet Take 1 tablet (500 mg total) by mouth daily for 7 days.   metaxalone (SKELAXIN) 800 MG tablet May take 1/2-1 whole tablet up to 3 times daily as needed for pain or spasm.   metoprolol tartrate  (LOPRESSOR) 25 MG tablet Take 25 mg by mouth 2 (two) times daily.   Multiple Vitamins-Minerals (ICAPS PO) Take 1 tablet by mouth 2 (two) times daily.   No facility-administered medications prior to visit.    Review of Systems  {Labs  Heme  Chem  Endocrine  Serology  Results Review (optional):23779}   Objective    BP 131/63 (BP Location: Right Arm, Patient Position: Sitting, Cuff Size: Normal)   Pulse 78   Resp 16   Wt 112 lb (50.8 kg)   SpO2 100%   BMI 18.64 kg/m  {Show previous vital signs (optional):23777}  Physical Exam HENT:     Right Ear: Decreased hearing noted. No drainage or tenderness. A middle ear effusion is present. There is no impacted cerumen. Tympanic membrane is not injected, erythematous or bulging.     Left Ear: Decreased hearing noted. No drainage or tenderness.  No middle ear effusion. There is no impacted cerumen. Tympanic membrane is not injected, erythematous or bulging.     Mouth/Throat:     Mouth: Mucous membranes are moist.     Pharynx: No oropharyngeal exudate or posterior oropharyngeal erythema.  Eyes:     Conjunctiva/sclera: Conjunctivae normal.  Cardiovascular:     Rate and Rhythm: Regular rhythm.     Pulses: Normal pulses.     Heart sounds: No murmur heard.    No friction rub.  Pulmonary:     Effort: Pulmonary effort  is normal. No respiratory distress.     Breath sounds: Normal breath sounds. No stridor. No wheezing, rhonchi or rales.     ***  No results found for any visits on 05/10/22.  Assessment & Plan     Problem List Items Addressed This Visit   None    Return if symptoms worsen or fail to improve.      I, Darlene Foster, MD, have reviewed all documentation for this visit.  Portions of this information were initially documented by the CMA and reviewed by me for thoroughness and accuracy.      Darlene Foster, MD  Darlene Serrano [phone] (phone) [phone] (fax)  Darlene Serrano

## 2022-05-10 ENCOUNTER — Ambulatory Visit (INDEPENDENT_AMBULATORY_CARE_PROVIDER_SITE_OTHER): Payer: Medicare Other | Admitting: Family Medicine

## 2022-05-10 ENCOUNTER — Encounter: Payer: Self-pay | Admitting: Family Medicine

## 2022-05-10 VITALS — BP 131/63 | HR 78 | Resp 16 | Wt 112.0 lb

## 2022-05-10 DIAGNOSIS — J018 Other acute sinusitis: Secondary | ICD-10-CM

## 2022-05-10 DIAGNOSIS — H938X1 Other specified disorders of right ear: Secondary | ICD-10-CM | POA: Insufficient documentation

## 2022-05-10 MED ORDER — AZELASTINE HCL 137 MCG/SPRAY NA SOLN: 2.0000 | Freq: Two times a day (BID) | NASAL | 2 refills | Status: DC

## 2022-05-10 NOTE — Patient Instructions (Addendum)
Throat Coat tea with honey to help with recovery.   You can continue to drink coffee and eat soup daily.   Please start using the nasal spray daily. Please spray two sprays into the nostrils twice daily.

## 2022-05-13 NOTE — Assessment & Plan Note (Signed)
Recommended Azelastine nasal spray

## 2022-05-13 NOTE — Assessment & Plan Note (Signed)
Resolved after abx treatment is nearly complete with Levaquin RX  Some residual hoarseness  Recommended oral hydration with warm fluids and honey  Counseled that it could take a few more weeks to resolve

## 2022-05-15 ENCOUNTER — Encounter: Payer: Self-pay | Admitting: Family Medicine

## 2022-05-15 ENCOUNTER — Ambulatory Visit (INDEPENDENT_AMBULATORY_CARE_PROVIDER_SITE_OTHER): Payer: Medicare Other | Admitting: Family Medicine

## 2022-05-15 VITALS — BP 150/64 | HR 79 | Temp 97.8°F | Resp 16 | Wt 109.6 lb

## 2022-05-15 DIAGNOSIS — J040 Acute laryngitis: Secondary | ICD-10-CM | POA: Diagnosis not present

## 2022-05-15 DIAGNOSIS — R030 Elevated blood-pressure reading, without diagnosis of hypertension: Secondary | ICD-10-CM

## 2022-05-15 DIAGNOSIS — I10 Essential (primary) hypertension: Secondary | ICD-10-CM | POA: Insufficient documentation

## 2022-05-15 MED ORDER — PREDNISONE 10 MG PO TABS: 10.0000 mg | ORAL_TABLET | Freq: Every day | ORAL | 0 refills | Status: AC

## 2022-05-15 NOTE — Progress Notes (Signed)
Established patient visit   Patient: Darlene Serrano   DOB: Jan 23, 1935   87 y.o. Female  MRN: [id number] Visit Date: 05/15/2022  Today's healthcare provider: [name], MD   Chief Complaint  Patient presents with   Sore Throat   Subjective    HPI   Sore Throat Patient here because she is not better from her throat hurting. She was seen 12/15. She is using the nasal spray that she was prescribed.  She reports that she has a scratchy throat and still feels that the hoarseness is getting worse She reports that needing to clear her throat frequently  She sometimes is able to cough up sputum but mostly reports a hacking cough She denies feeling feverish or having chills  She denies symptoms of heartburn  She has cough residual from COVID + prior to treatment with levaquin for sinusitis  She has been been drinking hot tea to soothe her throat    Elevated Blood pressure  Patient is not sure that she takes metoprolol '25mg'$  twice daily  She reports feeling fuzzy from feeling ill for so long  She does not normally measure her blood pressure     Medications: Outpatient Medications Prior to Visit  Medication Sig   ALLERGY 10 MG tablet TAKE 1 TABLET BY MOUTH DAILY   atorvastatin (LIPITOR) 10 MG tablet Take 1 tablet (10 mg total) by mouth every morning.   Azelastine HCl 137 MCG/SPRAY SOLN Place 2 sprays into the nose in the morning and at bedtime.   calcium carbonate (OS-CAL) 600 MG TABS tablet Take 600 mg by mouth daily.    cholecalciferol (VITAMIN D) 1000 UNITS tablet VITAMIN D, 1000UNIT (Oral Tablet)  1 Every Day for 0 days  Quantity: 0.00;  Refills: 0   Ordered :01-May-2010  [name] ;  Started 13-Apr-2009 Active   denosumab (PROLIA) 60 MG/ML SOSY injection Inject 60 mg into the skin every 6 (six) months.   ELIQUIS 5 MG TABS tablet    Fexofenadine HCl (ALLEGRA PO) Take by mouth.   furosemide (LASIX) 20 MG tablet TAKE ONE TABLET EVERY OTHER DAY    metaxalone (SKELAXIN) 800 MG tablet May take 1/2-1 whole tablet up to 3 times daily as needed for pain or spasm.   metoprolol tartrate (LOPRESSOR) 25 MG tablet Take 25 mg by mouth 2 (two) times daily.   Multiple Vitamins-Minerals (ICAPS PO) Take 1 tablet by mouth 2 (two) times daily.   No facility-administered medications prior to visit.    Review of Systems     Objective    BP (!) 150/64 (BP Location: Left Arm, Patient Position: Sitting, Cuff Size: Normal)   Pulse 79   Temp 97.8 F (36.6 C) (Oral)   Resp 16   Wt 109 lb 9.6 oz (49.7 kg)   SpO2 100%   BMI 18.24 kg/m    Physical Exam Constitutional:      General: She is not in acute distress.    Appearance: She is well-developed. She is not ill-appearing, toxic-appearing or diaphoretic.  HENT:     Mouth/Throat:     Mouth: Mucous membranes are dry. No injury or oral lesions.     Tongue: No lesions.     Palate: No mass and lesions.     Pharynx: Oropharynx is clear. Posterior oropharyngeal erythema present. No pharyngeal swelling, oropharyngeal exudate or uvula swelling.     Tonsils: No tonsillar exudate.  Neurological:     Mental Status: She is alert.  No results found for any visits on 05/15/22.  Assessment & Plan     Problem List Items Addressed This Visit       Respiratory   Laryngitis - Primary    Patient is recovering from URI and treatment for PNA following +COVID test Recommended that she continue drinking warm fluids including warm team with honey as well as soup broths  Given concern for continue discomfort from hoarseness and sore throat, offered short course of steroids to include prednisone '10mg'$  daily for 3 days (patient verbalized she would rather do a shorter course than 5 days)          Other   Elevated blood pressure reading    BP elevated systolic measurements in 161W on two measurements  Patient is not sure of medication lists  Noted to be on metoprolol '25mg'$  twice daily  Patient anxious  appearing with conversation regarding BP elevation Recommended follow up in 1 month for BP check        Return in about 1 month (around 06/15/2022) for HTN .      I, [name], MD, have reviewed all documentation for this visit.  Portions of this information were initially documented by the CMA and reviewed by me for thoroughness and accuracy.    [name], MD  [facility] [phone] (phone) [phone] (fax)  [place]

## 2022-05-15 NOTE — Assessment & Plan Note (Signed)
BP elevated systolic measurements in 460C on two measurements  Patient is not sure of medication lists  Noted to be on metoprolol '25mg'$  twice daily  Patient anxious appearing with conversation regarding BP elevation Recommended follow up in 1 month for BP check

## 2022-05-15 NOTE — Assessment & Plan Note (Signed)
Patient is recovering from URI and treatment for PNA following +COVID test Recommended that she continue drinking warm fluids including warm team with honey as well as soup broths  Given concern for continue discomfort from hoarseness and sore throat, offered short course of steroids to include prednisone '10mg'$  daily for 3 days (patient verbalized she would rather do a shorter course than 5 days)

## 2022-05-15 NOTE — Patient Instructions (Addendum)
Laryngitis    I have prescribed prednisone for you to take for three days (5 if symptoms persist).  It is important to continue drinking 6-8  eight oz glasses of water per day to stay hydrated along with warm teas with honey and soup broth to soothe the throat. This medication should help with decreasing inflammation.  Laryngitis is irritation and swelling (inflammation) of your vocal cords. It causes your voice to sound hoarse and may cause you to lose your voice. Depending on the cause, this condition may go away after a short time or may last for more than 3 weeks. Treatment often involves resting your voice and using medicines to soothe your throat. What are the causes? Laryngitis that lasts for a short time may be caused by: An infection caused by a virus. Lots of talking, yelling, or singing. This is also called vocal strain. An infection caused by bacteria. Laryngitis that lasts for more than 3 weeks can be caused by: Lots of talking, yelling, or singing. An injury to the vocal cords. Acid reflux. Allergies. Sinus infection. Mucus draining from the nose down the throat (postnasal drip). Smoking. Drinking too much alcohol. Breathing in chemicals or dust. Having growths on the vocal cords. What increases the risk? Smoking. Drinking too much alcohol. Having allergies. Breathing in fumes at work. What are the signs or symptoms? A change in your voice. It may sound low and hoarse. Loss of voice. Dry cough. Sore throat. Dry throat. Stuffy nose. How is this treated? Treatment depends on what is causing the laryngitis. Usually, treatment includes: Resting your voice. Using medicines to soothe your throat. If your laryngitis is caused by an infection from bacteria, you may need to take antibiotics. If your laryngitis is caused by a growth on your vocal cords, you may need to have a surgery to remove it. Follow these instructions at home: Medicines Take over-the-counter and  prescription medicines only as told by your doctor. If you were prescribed an antibiotic medicine, take it as told by your doctor. Do not stop taking it even if you start to feel better. Use throat lozenges or sprays to soothe your throat as told by your doctor. General instructions  Talk as little as possible. To do this: Avoid whispering. Write instead of talking. Do this until your voice is back to normal. Rinse your mouth (gargle) with a salt water mixture 3-4 times a day or as needed. To make salt water, dissolve -1 tsp (3-6 g) of salt in 1 cup (237 mL) of warm water. Do not swallow this mixture. Drink enough fluid to keep your pee (urine) pale yellow. Breathe in moist air. Use a humidifier if you live in a dry climate. Do not smoke or use any products that contain nicotine or tobacco. If you need help quitting, ask your doctor. Contact a doctor if: You have a fever. Your pain is worse. Your symptoms do not get better in 2 weeks. Get help right away if: You cough up blood. You have trouble swallowing. You have trouble breathing. Summary Laryngitis is inflammation of your vocal cords. This condition causes your voice to sound low and hoarse. Rest your voice by talking as little as possible. Also avoid whispering. Get help right away if you have trouble swallowing or breathing or if you cough up blood. This information is not intended to replace advice given to you by your health care provider. Make sure you discuss any questions you have with your health care provider. Document  Revised: 07/31/2020 Document Reviewed: 07/31/2020 Elsevier Patient Education  [place].

## 2022-05-24 ENCOUNTER — Encounter: Payer: Self-pay | Admitting: Student

## 2022-05-24 ENCOUNTER — Ambulatory Visit: Payer: Medicare Other | Admitting: Student

## 2022-05-24 VITALS — BP 140/76 | HR 67 | Temp 97.6°F | Ht 65.0 in | Wt 111.0 lb

## 2022-05-24 DIAGNOSIS — J3089 Other allergic rhinitis: Secondary | ICD-10-CM

## 2022-05-24 DIAGNOSIS — H9193 Unspecified hearing loss, bilateral: Secondary | ICD-10-CM | POA: Insufficient documentation

## 2022-05-24 DIAGNOSIS — Z711 Person with feared health complaint in whom no diagnosis is made: Secondary | ICD-10-CM | POA: Diagnosis not present

## 2022-05-24 DIAGNOSIS — R413 Other amnesia: Secondary | ICD-10-CM | POA: Diagnosis not present

## 2022-05-24 DIAGNOSIS — I480 Paroxysmal atrial fibrillation: Secondary | ICD-10-CM | POA: Diagnosis not present

## 2022-05-24 DIAGNOSIS — I5032 Chronic diastolic (congestive) heart failure: Secondary | ICD-10-CM | POA: Diagnosis not present

## 2022-05-24 DIAGNOSIS — H9113 Presbycusis, bilateral: Secondary | ICD-10-CM

## 2022-05-24 DIAGNOSIS — H9190 Unspecified hearing loss, unspecified ear: Secondary | ICD-10-CM | POA: Insufficient documentation

## 2022-05-24 DIAGNOSIS — H3530 Unspecified macular degeneration: Secondary | ICD-10-CM

## 2022-05-24 NOTE — Patient Instructions (Signed)
Nice to Meet you today!   I hope you have a wonderful New Year!

## 2022-05-24 NOTE — Progress Notes (Signed)
Location:   [facility]    Place of Service:   [facility]   Provider: Unk Lightning  Code Status: full code Goals of Care:     01/25/2015    4:35 PM  Advanced Directives  Does Patient Have a Medical Advance Directive? Yes  Type of Paramedic of [place];Living will     Chief Complaint  Patient presents with   Establish Care    Np Patient to Establish Care. Here with Daughter. Patient does not want to establish but daughter wants her to for convenience      HPI: Patient is a 87 y.o. female seen today for medical management of chronic diseases.  She is here with her Daughter, Herbert Spires today.   She had COVID in November and had some isolation. It's taken some time to feel better. She started walking just about 2 weeks ago. She does 2 miles in the morning and one mile in the afternoon.   She sees cardiology regularly - no symptoms with atrial fibrillation or CHF, [facility].  Orthopedics - hx of pelvis fracture (2019) and rib fracture, s/p hip replacement in 2015  Allergies - seasonal Acid reflux - No issues.   She wears hearing aids. Macular degeneration  She lives at a [facility] here on [address] and was in [place] prior to that. She enjoys walking, exericise, visiting. She stopped driving 3 years ago due to macular degeneration. She used to work at [employer] before moving to [employer]. 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   Mentation- Memory changes have only been since UTIs and now in the time frame. Medications were getting agitating. She is getting them as bottles -- she has a routine, but there were medicatiion changes that were difficult. Tells random stories about other people, and her history but doesn't give exact context regarding these stories.   Sleep - Her sleep has been fine, perhaps 7-8 hours. Gets up to use the bathroom at night but she goes back to  sleep. Mood - COVID has been an issue.   Medications- She has no issue, daughter had concerns during her acute illness.   Past Medical History:  Diagnosis Date   Arthritis    RIGHT HIP AND HANDS;  DDD   Asthma 08/30/2010   Cancer ([place])    LOCAL EXCISION SKIN CA FROM NOSE  06/11/13   History of palpitations    YEARS AGO   Impaired hearing    Macular degeneration of both eyes    HX OF INJECTIONS INTO LEFT EYE ( ? AVASTIN ) FROM OCT 2012 TO OCT 2014.   Osteoporosis    Pure hypercholesterolemia    Unspecified vitamin D deficiency     Past Surgical History:  Procedure Laterality Date   11 TMJ SURGERY     1996 LEFT KNEE ARTHROSCOPY     ABDOMINAL HYSTERECTOMY  [place] DISCECTOMY   MOHS SURGERY FOREHEAD FOR BASAL CELL CA  2014   TOTAL HIP ARTHROPLASTY Right 06/30/2013   Procedure: RIGHT TOTAL HIP ARTHROPLASTY ANTERIOR APPROACH;  Surgeon: Gearlean Alf, MD;  Location: [facility];  Service: Orthopedics;  Laterality: Right;    Allergies  Allergen Reactions   Penicillins Swelling   Codeine Itching   Fluticasone Propionate Other (See Comments)    Made the patient's throat  raw     Outpatient Encounter Medications as of 05/24/2022  Medication Sig   ALLERGY 10 MG tablet TAKE 1 TABLET BY MOUTH DAILY   apixaban (ELIQUIS) 2.5 MG TABS tablet Take by mouth 2 (two) times daily.   atorvastatin (LIPITOR) 10 MG tablet Take 1 tablet (10 mg total) by mouth every morning.   Azelastine HCl 137 MCG/SPRAY SOLN Place 2 sprays into the nose in the morning and at bedtime.   calcium carbonate (OS-CAL) 600 MG TABS tablet Take 600 mg by mouth daily.    denosumab (PROLIA) 60 MG/ML SOSY injection Inject 60 mg into the skin every 6 (six) months.   Fexofenadine HCl (ALLEGRA PO) Take by mouth.   flecainide (TAMBOCOR) 50 MG tablet Take 50 mg by mouth 3 (three) times daily.   furosemide (LASIX) 20 MG tablet TAKE ONE TABLET EVERY OTHER DAY   Multiple  Vitamins-Minerals (ICAPS PO) Take 1 tablet by mouth 2 (two) times daily.   [DISCONTINUED] cholecalciferol (VITAMIN D) 1000 UNITS tablet VITAMIN D, 1000UNIT (Oral Tablet)  1 Every Day for 0 days  Quantity: 0.00;  Refills: 0   Ordered :01-May-2010  [name] ;  Started 13-Apr-2009 Active   [DISCONTINUED] ELIQUIS 5 MG TABS tablet    [DISCONTINUED] metaxalone (SKELAXIN) 800 MG tablet May take 1/2-1 whole tablet up to 3 times daily as needed for pain or spasm.   [DISCONTINUED] metoprolol tartrate (LOPRESSOR) 25 MG tablet Take 25 mg by mouth 2 (two) times daily.   No facility-administered encounter medications on file as of 05/24/2022.    Review of Systems:  Review of Systems  All other systems reviewed and are negative.   Health Maintenance  Topic Date Due   COVID-19 Vaccine (1) Never done   Zoster Vaccines- Shingrix (1 of 2) Never done   Medicare Annual Wellness (AWV)  05/03/2016   DTaP/Tdap/Td (2 - Td or Tdap) 02/08/2021   Pneumonia Vaccine 71+ Years old  Completed   INFLUENZA VACCINE  Completed   DEXA SCAN  Completed   HPV VACCINES  Aged Out    Physical Exam: Vitals:   05/24/22 1255  BP: (!) 140/76  Pulse: 67  Temp: 97.6 F (36.4 C)  SpO2: 97%  Weight: 111 lb (50.3 kg)  Height: '5\' 5"'$  (1.651 m)   Body mass index is 18.47 kg/m. Physical Exam Vitals reviewed.  Constitutional:      Appearance: Normal appearance.  HENT:     Right Ear: Tympanic membrane, ear canal and external ear normal.     Left Ear: Tympanic membrane, ear canal and external ear normal.     Ears:     Comments: Hard of Hearing with hearing aids present    Nose: Nose normal.     Mouth/Throat:     Mouth: Mucous membranes are moist.  Eyes:     Pupils: Pupils are equal, round, and reactive to light.  Cardiovascular:     Rate and Rhythm: Normal rate and regular rhythm.     Pulses: Normal pulses.     Heart sounds: Normal heart sounds.  Pulmonary:     Effort: Pulmonary effort is normal.     Breath  sounds: Normal breath sounds.  Abdominal:     General: Abdomen is flat.     Palpations: Abdomen is soft.  Musculoskeletal:     Cervical back: Normal range of motion and neck supple.  Skin:    General: Skin is warm and dry.  Neurological:     Mental Status: She  is alert.  Psychiatric:     Comments: Patient yelled at [place] insisted she was forced to have this appointment by her daughter. She states she does not want to transition her care or move to assisted living.      Labs reviewed: Basic Metabolic Panel: Recent Labs    07/27/21 0921  NA 136  K 4.7  CL 99  CO2 21  GLUCOSE 101*  BUN 15  CREATININE 0.71  CALCIUM 9.3  TSH 1.980   Liver Function Tests: Recent Labs    07/27/21 0921  AST 31  ALT 13  ALKPHOS 96  BILITOT 0.6  PROT 6.5  ALBUMIN 4.5   No results for input(s): "LIPASE", "AMYLASE" in the last 8760 hours. No results for input(s): "AMMONIA" in the last 8760 hours. CBC: Recent Labs    07/27/21 0921  WBC 6.0  NEUTROABS 4.0  HGB 13.5  HCT 40.4  MCV 96  PLT 238   Lipid Panel: Recent Labs    07/27/21 0921  CHOL 165  HDL 71  LDLCALC 76  TRIG 100   No results found for: "HGBA1C"  Procedures since last visit: No results found.  Assessment/Plan 1. Worried well 2. Memory changes Patient presents to clinic after recent COVID infection for potential transition for primary care. Patient states she DOES NOT want to transition care to [place]. She has some confusion with timeline of events, locations for events, and who certain individuals are. There were concerns regarding her memory noted at most recent visit with PCP, however, no record or MMSE, SLUMS, or MoCA.  Based on history with daughter, unclear if all of this is acute versus chronic and clarifying between delirium and dementia is essential to make sure patient is in the safest level of care in [facility]. She declines memory assessment at this time. Recommend PCP follow up with this testing or  referral to neurocognitive testing to assess patient's memory changes. Assess for mood changes and regular hearing and vision treatment. Medications are appropriate at this time, could consider discontinuing antihistamines to prevent impact on memory. Encourage continued healthy diet and exercise. No movements concerns at this time. Patient would benefit from a comprehensive geriatric assessment involving entire inter-disciplinary team. Recommend PCP follow up with this testing or referral to neurocognitive testing to assess patient's memory changes.   3. Chronic diastolic CHF (congestive heart failure), NYHA class 2 (HCC) No symptoms at this time. CTM.   4. Paroxysmal atrial fibrillation ([place]) Patient denies symptoms of palpitations or flutter at this time, continue apixiban 2.5 mg BID.   5. Seasonal allergic rhinitis due to other allergic trigger Patient with symptoms seasonally. Plan to continue PRN allegra.   6. Macular degeneration of both eyes, unspecified type 7. Presbycusis of both ears Discussed with daughter concern for how sensory changes contribute to patients memory and mood changes. She had significant difficulty following certain questions during conversation. Continue annual assessments at minimum for further support.     Labs/tests ordered:  * No order type specified * Next appt:  Visit date not found

## 2022-06-12 NOTE — Progress Notes (Signed)
I,Connie R Striblin,acting as a Education administrator for [employer], MD.,have documented all relevant documentation on the behalf of Darlene Foster, MD,as directed by  Darlene Foster, MD while in the presence of Darlene Foster, MD.   Established patient visit   Patient: Darlene Serrano   DOB: [birth year]   88 y.o. Female  MRN: [id number] Visit Date: 06/13/2022  Today's healthcare provider: [name], MD   Chief Complaint  Patient presents with   Follow-up   Subjective    HPI  Hypertension, follow-up  BP Readings from Last 3 Encounters:  06/13/22 (!) 125/56  05/24/22 (!) 140/76  05/15/22 (!) 150/64   Wt Readings from Last 3 Encounters:  06/13/22 113 lb 12.8 oz (51.6 kg)  05/24/22 111 lb (50.3 kg)  05/15/22 109 lb 9.6 oz (49.7 kg)     She was last seen for hypertension 4 weeks ago.  BP at that visit was 150/64. Management since that visit includes monitoring at home and continuing metoprolol '25mg'$  twice daily  .  She reports excellent compliance with treatment. She is not having side effects.  She is following a Regular diet. Outside blood pressures are not being taken  Symptoms: No chest pain No chest pressure  No palpitations No syncope  No dyspnea No orthopnea  No paroxysmal nocturnal dyspnea No lower extremity edema   Pertinent labs Lab Results  Component Value Date   CHOL 165 07/27/2021   HDL 71 07/27/2021   LDLCALC 76 07/27/2021   TRIG 100 07/27/2021   CHOLHDL 2.0 08/04/2017   Lab Results  Component Value Date   NA 136 07/27/2021   K 4.7 07/27/2021   CREATININE 0.71 07/27/2021   EGFR 84 07/27/2021   GLUCOSE 101 (H) 07/27/2021   TSH 1.980 07/27/2021     The ASCVD Risk score (Arnett DK, et al., 2019) failed to calculate for the following reasons:   The 2019 ASCVD risk score is only valid for ages 52 to 67  Follow up for Laryngitis    The patient was last seen for this 5 weeks ago. Changes made at  last visit include prednisone '10mg'$  daily for 3 days .  She reports excellent compliance with treatment. She feels that condition is Improved. She is not having side effects.   Pt is still complaining of head congestion with associated symptom of hoarse voice  She reports that her voice is improved but she is concerned that the nasal spray caused her to have drainage down the back of her throat  She state that an antibiotic   She reports that she feels drainage from her head and congestion  She reports that symptoms have been present for 1 month  Discussed recommendation for referral to ENT, patient prefers to trial abx first   -----------------------------------------------------------------------------------------   Medications: Outpatient Medications Prior to Visit  Medication Sig   apixaban (ELIQUIS) 2.5 MG TABS tablet Take by mouth 2 (two) times daily.   atorvastatin (LIPITOR) 10 MG tablet Take 1 tablet (10 mg total) by mouth every morning.   calcium carbonate (OS-CAL) 600 MG TABS tablet Take 600 mg by mouth daily.    denosumab (PROLIA) 60 MG/ML SOSY injection Inject 60 mg into the skin every 6 (six) months.   Fexofenadine HCl (ALLEGRA PO) Take by mouth.   flecainide (TAMBOCOR) 50 MG tablet Take 50 mg by mouth 3 (three) times daily.   furosemide (LASIX) 20 MG tablet TAKE ONE TABLET EVERY OTHER DAY   Multiple Vitamins-Minerals (ICAPS PO)  Take 1 tablet by mouth 2 (two) times daily.   [DISCONTINUED] Azelastine HCl 137 MCG/SPRAY SOLN Place 2 sprays into the nose in the morning and at bedtime.   No facility-administered medications prior to visit.    Review of Systems     Objective    BP (!) 125/56 (BP Location: Right Arm, Patient Position: Sitting, Cuff Size: Normal)   Pulse 89   Temp 97.9 F (36.6 C) (Oral)   Wt 113 lb 12.8 oz (51.6 kg)   SpO2 98%   BMI 18.94 kg/m    Physical Exam HENT:     Nose: Nose normal. No nasal tenderness, mucosal edema or rhinorrhea.      Right Nostril: No foreign body, epistaxis or occlusion.     Left Nostril: No foreign body, epistaxis or occlusion.     Right Turbinates: Not enlarged or swollen.     Left Turbinates: Not enlarged or swollen.     Right Sinus: No maxillary sinus tenderness or frontal sinus tenderness.     Left Sinus: No maxillary sinus tenderness or frontal sinus tenderness.     Mouth/Throat:     Mouth: Mucous membranes are moist. No oral lesions or angioedema.     Dentition: Normal dentition.     Tongue: No lesions. Tongue does not deviate from midline.     Palate: No mass and lesions.     Pharynx: Oropharynx is clear. Posterior oropharyngeal erythema present. No pharyngeal swelling, oropharyngeal exudate or uvula swelling.     Tonsils: No tonsillar exudate.     Comments:   Pulmonary:     Effort: Pulmonary effort is normal. No respiratory distress.       No results found for any visits on 06/13/22.  Assessment & Plan     Problem List Items Addressed This Visit       Respiratory   Congestion of upper airway - Primary    Recommended ENT referral due to persistent upper airway symptoms despite abx, steroids, and allergy treatments  Patient's report of drainage problems with nasal spray discussed, recommended that she discontinue the nasal spray as it reportedly made her symptoms worse  She repeatedly declines recommendation for ENT referral and prefers to treat as sinus infection despite having no objective findings (maxillary/frontal sinus tenderness/fullness, no fever, no purulent nasal discharge, no cough and drainage only reported when using nasal spray)  Agreed to treat and re-evaluate for persistent symptoms and referral if patient is agreeable at that time  Follow up PRN       Relevant Medications   doxycycline (VIBRA-TABS) 100 MG tablet     Return if symptoms worsen or fail to improve.        The entirety of the information documented in the History of Present Illness, Review of  Systems and Physical Exam were personally obtained by me. Portions of this information were initially documented by Eduard Clos, CMA and reviewed by me for thoroughness and accuracy.Darlene Foster, MD     Darlene Foster, MD  [facility] [phone] (phone) [phone] (fax)  [place]

## 2022-06-13 ENCOUNTER — Ambulatory Visit: Payer: Medicare Other | Admitting: Family Medicine

## 2022-06-13 ENCOUNTER — Encounter: Payer: Self-pay | Admitting: Family Medicine

## 2022-06-13 ENCOUNTER — Ambulatory Visit (INDEPENDENT_AMBULATORY_CARE_PROVIDER_SITE_OTHER): Payer: Medicare Other | Admitting: Family Medicine

## 2022-06-13 VITALS — BP 125/56 | HR 89 | Temp 97.9°F | Wt 113.8 lb

## 2022-06-13 DIAGNOSIS — J988 Other specified respiratory disorders: Secondary | ICD-10-CM | POA: Insufficient documentation

## 2022-06-13 DIAGNOSIS — J309 Allergic rhinitis, unspecified: Secondary | ICD-10-CM | POA: Insufficient documentation

## 2022-06-13 MED ORDER — DOXYCYCLINE HYCLATE 100 MG PO TABS: 100.0000 mg | ORAL_TABLET | Freq: Two times a day (BID) | ORAL | 0 refills | Status: AC

## 2022-06-13 NOTE — Assessment & Plan Note (Signed)
Held lengthy discussion for recommendation for ENT due to prolonged symptoms despite treatment with levaquin, nasal spray, allegra and steroid course  Patient adamant that she does not want to be referrred to any other providers and repeats dissatisfaction with the fact that her symptoms have remained  She repeatedly requested abx be prescribed despite explanation that she does not have symptoms that would be improved by antibiotics based on my examination and the history provided  Ultimately compromised on treatment for sinusitis and follow up if symptoms are not improved and would again recommend ENT referral at that time

## 2022-06-14 ENCOUNTER — Telehealth: Payer: Self-pay | Admitting: Family Medicine

## 2022-06-14 NOTE — Telephone Encounter (Signed)
Copied from [place] [phone] [id number]. Topic: General - Other >> Jun 13, 2022  2:59 PM Oley Balm A wrote: Reason for CRM: Patient would like a call back from PCP to discuss some things with her regarding her appointment that she had today.  Please advise

## 2022-06-14 NOTE — Telephone Encounter (Signed)
Pt is calling back to speak to office manager. Please advise CB- [phone]

## 2022-06-14 NOTE — Telephone Encounter (Signed)
Spoke with patient.  Her concern was changing suites from where she was use to going.  I assured her she would receive the same great care on either side of our practice.    Patient kept stating it was dark and drury on the other side and she didn't like it.  However, patient continues to want to see MD.

## 2022-06-14 NOTE — Assessment & Plan Note (Signed)
Recommended ENT referral due to persistent upper airway symptoms despite abx, steroids, and allergy treatments  Patient's report of drainage problems with nasal spray discussed, recommended that she discontinue the nasal spray as it reportedly made her symptoms worse  She repeatedly declines recommendation for ENT referral and prefers to treat as sinus infection despite having no objective findings (maxillary/frontal sinus tenderness/fullness, no fever, no purulent nasal discharge, no cough and drainage only reported when using nasal spray)  Agreed to treat and re-evaluate for persistent symptoms and referral if patient is agreeable at that time  Follow up PRN

## 2022-07-01 DIAGNOSIS — R600 Localized edema: Secondary | ICD-10-CM | POA: Diagnosis not present

## 2022-07-01 DIAGNOSIS — R001 Bradycardia, unspecified: Secondary | ICD-10-CM | POA: Diagnosis not present

## 2022-07-01 DIAGNOSIS — I480 Paroxysmal atrial fibrillation: Secondary | ICD-10-CM | POA: Diagnosis not present

## 2022-07-01 DIAGNOSIS — E782 Mixed hyperlipidemia: Secondary | ICD-10-CM | POA: Diagnosis not present

## 2022-07-01 DIAGNOSIS — I493 Ventricular premature depolarization: Secondary | ICD-10-CM | POA: Diagnosis not present

## 2022-07-01 DIAGNOSIS — I5032 Chronic diastolic (congestive) heart failure: Secondary | ICD-10-CM | POA: Diagnosis not present

## 2022-07-24 DIAGNOSIS — R059 Cough, unspecified: Secondary | ICD-10-CM | POA: Diagnosis not present

## 2022-07-24 DIAGNOSIS — J301 Allergic rhinitis due to pollen: Secondary | ICD-10-CM | POA: Diagnosis not present

## 2022-07-24 DIAGNOSIS — K219 Gastro-esophageal reflux disease without esophagitis: Secondary | ICD-10-CM | POA: Diagnosis not present

## 2022-07-24 DIAGNOSIS — R490 Dysphonia: Secondary | ICD-10-CM | POA: Diagnosis not present

## 2022-07-24 DIAGNOSIS — J329 Chronic sinusitis, unspecified: Secondary | ICD-10-CM | POA: Diagnosis not present

## 2022-07-24 DIAGNOSIS — Z8616 Personal history of COVID-19: Secondary | ICD-10-CM | POA: Diagnosis not present

## 2022-07-26 DIAGNOSIS — I480 Paroxysmal atrial fibrillation: Secondary | ICD-10-CM | POA: Diagnosis not present

## 2022-07-26 DIAGNOSIS — I493 Ventricular premature depolarization: Secondary | ICD-10-CM | POA: Diagnosis not present

## 2022-07-31 DIAGNOSIS — I5032 Chronic diastolic (congestive) heart failure: Secondary | ICD-10-CM | POA: Diagnosis not present

## 2022-07-31 DIAGNOSIS — I480 Paroxysmal atrial fibrillation: Secondary | ICD-10-CM | POA: Diagnosis not present

## 2022-07-31 DIAGNOSIS — E782 Mixed hyperlipidemia: Secondary | ICD-10-CM | POA: Diagnosis not present

## 2022-07-31 DIAGNOSIS — I493 Ventricular premature depolarization: Secondary | ICD-10-CM | POA: Diagnosis not present

## 2022-07-31 DIAGNOSIS — R001 Bradycardia, unspecified: Secondary | ICD-10-CM | POA: Diagnosis not present

## 2022-07-31 DIAGNOSIS — R600 Localized edema: Secondary | ICD-10-CM | POA: Diagnosis not present

## 2022-08-09 DIAGNOSIS — J320 Chronic maxillary sinusitis: Secondary | ICD-10-CM | POA: Diagnosis not present

## 2022-11-11 ENCOUNTER — Ambulatory Visit (INDEPENDENT_AMBULATORY_CARE_PROVIDER_SITE_OTHER): Payer: Medicare Other

## 2022-11-11 VITALS — Ht 65.0 in | Wt 113.0 lb

## 2022-11-11 DIAGNOSIS — Z0000 Encounter for general adult medical examination without abnormal findings: Secondary | ICD-10-CM | POA: Diagnosis not present

## 2022-11-11 NOTE — Progress Notes (Signed)
I connected with  Darlene Serrano on 11/11/22 by a audio enabled telemedicine application and verified that I am speaking with the correct person using two identifiers.  Patient Location: Home  Provider Location: Office/Clinic  I discussed the limitations of evaluation and management by telemedicine. The patient expressed understanding and agreed to proceed.  Subjective:   Darlene Serrano is a 88 y.o. female who presents for an Initial Medicare Annual Wellness Visit.  Review of Systems    Cardiac Risk Factors include: advanced age (>37men, >61 women)    Objective:    Today's Vitals   11/11/22 1338  Weight: 113 lb (51.3 kg)  Height: 5\' 5"  (1.651 m)   Body mass index is 18.8 kg/m.     11/11/2022    2:06 PM 01/25/2015    4:35 PM 06/30/2013   11:10 AM 06/23/2013    1:36 PM  Advanced Directives  Does Patient Have a Medical Advance Directive?  Yes Patient has advance directive, copy not in chart Patient has advance directive, copy not in chart  Type of Advance Directive  Healthcare Power of [place];Living will Healthcare Power of [place];Living will Healthcare Power of [place];Living will  Does patient want to make changes to medical advance directive?   No   Copy of Healthcare Power of Attorney in Chart?   Copy requested from family   Would patient like information on creating a medical advance directive? No - Patient declined       Current Medications (verified) Outpatient Encounter Medications as of 11/11/2022  Medication Sig   apixaban (ELIQUIS) 2.5 MG TABS tablet Take by mouth 2 (two) times daily.   atorvastatin (LIPITOR) 10 MG tablet Take 1 tablet (10 mg total) by mouth every morning.   denosumab (PROLIA) 60 MG/ML SOSY injection Inject 60 mg into the skin every 6 (six) months.   Fexofenadine HCl (ALLEGRA PO) Take by mouth.   flecainide (TAMBOCOR) 50 MG tablet Take 50 mg by mouth 3 (three) times daily.   furosemide (LASIX) 20 MG tablet TAKE ONE TABLET EVERY OTHER DAY    Multiple Vitamins-Minerals (ICAPS PO) Take 1 tablet by mouth 2 (two) times daily.   calcium carbonate (OS-CAL) 600 MG TABS tablet Take 600 mg by mouth daily.    No facility-administered encounter medications on file as of 11/11/2022.    Allergies (verified) Penicillins, Codeine, and Fluticasone propionate   History: Past Medical History:  Diagnosis Date   Acid reflux 04/13/2009   Arthritis    RIGHT HIP AND HANDS;  DDD   Asthma 08/30/2010   Cancer (HCC)    LOCAL EXCISION SKIN CA FROM NOSE  06/11/13   History of palpitations    YEARS AGO   Impaired hearing    Macular degeneration of both eyes    HX OF INJECTIONS INTO LEFT EYE ( ? AVASTIN ) FROM OCT 2012 TO OCT 2014.   Osteoporosis    Pure hypercholesterolemia    Rapid palpitations 09/30/2012   Unspecified vitamin D deficiency    Past Surgical History:  Procedure Laterality Date   1987 TMJ SURGERY     1996 LEFT KNEE ARTHROSCOPY     ABDOMINAL HYSTERECTOMY  1971   APPENDECTOMY  1964   BACK SURGERY  1993   LUMBAR DISCECTOMY   MOHS SURGERY FOREHEAD FOR BASAL CELL CA  2014   TOTAL HIP ARTHROPLASTY Right 06/30/2013   Procedure: RIGHT TOTAL HIP ARTHROPLASTY ANTERIOR APPROACH;  Surgeon: Loanne Drilling, MD;  Location: [facility];  Service: Orthopedics;  Laterality: Right;   Family History  Problem Relation Age of Onset   Congestive Heart Failure Mother    AAA (abdominal aortic aneurysm) Father    Cancer Father    Breast cancer Neg Hx    Social History   Socioeconomic History   Marital status: Divorced    Spouse name: Not on file   Number of children: Not on file   Years of education: Not on file   Highest education level: Not on file  Occupational History   Not on file  Tobacco Use   Smoking status: Never   Smokeless tobacco: Never  Vaping Use   Vaping Use: Never used  Substance and Sexual Activity   Alcohol use: No   Drug use: No   Sexual activity: Not on file  Other Topics Concern   Not on file  Social History  Narrative   Not on file   Social Determinants of Health   Financial Resource Strain: Low Risk  (11/11/2022)   Overall Financial Resource Strain (CARDIA)    Difficulty of Paying Living Expenses: Not hard at all  Food Insecurity: No Food Insecurity (11/11/2022)   Hunger Vital Sign    Worried About Running Out of Food in the Last Year: Never true    Ran Out of Food in the Last Year: Never true  Transportation Needs: No Transportation Needs (11/11/2022)   PRAPARE - Administrator, Civil Service (Medical): No    Lack of Transportation (Non-Medical): No  Physical Activity: Sufficiently Active (11/11/2022)   Exercise Vital Sign    Days of Exercise per Week: 6 days    Minutes of Exercise per Session: 60 min  Stress: No Stress Concern Present (11/11/2022)   [employer] of Occupational Health - Occupational Stress Questionnaire    Feeling of Stress : Not at all  Social Connections: Moderately Integrated (11/11/2022)   Social Connection and Isolation Panel [NHANES]    Frequency of Communication with Friends and Family: More than three times a week    Frequency of Social Gatherings with Friends and Family: Twice a week    Attends Religious Services: More than 4 times per year    Active Member of [employer] or Organizations: Yes    Attends Banker Meetings: More than 4 times per year    Marital Status: Widowed    Tobacco Counseling Counseling given: Not Answered   Clinical Intake:  Pre-visit preparation completed: Yes  Pain : No/denies pain   BMI - recorded: 18.8 Nutritional Status: BMI <19  Underweight Nutritional Risks: None Diabetes: No  How often do you need to have someone help you when you read instructions, pamphlets, or other written materials from your doctor or pharmacy?: 1 - Never  Diabetic?no  Interpreter Needed?: No  Comments: lives alone at [facility] Information entered by :: B.Shan Valdes,LPN   Activities of Daily Living    11/11/2022     1:49 PM  In your present state of health, do you have any difficulty performing the following activities:  Hearing? 0  Vision? 0  Difficulty concentrating or making decisions? 0  Walking or climbing stairs? 0  Dressing or bathing? 0  Doing errands, shopping? 0  Preparing Food and eating ? N  Using the Toilet? N  In the past six months, have you accidently leaked urine? N  Do you have problems with loss of bowel control? N  Managing your Medications? N  Managing your Finances? N  Housekeeping or managing your Housekeeping?  N    Patient Care Team: Ronnald Ramp, MD as PCP - General (Family Medicine)  Indicate any recent Medical Services you may have received from other than [facility] providers in the past year (date may be approximate).     Assessment:   This is a routine wellness examination for Darlene Serrano.  Hearing/Vision screen Hearing Screening - Comments:: Adequate hearing with hearing aides Vision Screening - Comments:: Adequate vision w/glasses Dr Lillie Fragmin  Dietary issues and exercise activities discussed: Current Exercise Habits: Home exercise routine;Structured exercise class, Type of exercise: walking;stretching, Time (Minutes): > 60, Frequency (Times/Week): 5, Weekly Exercise (Minutes/Week): 0, Intensity: Mild, Exercise limited by: cardiac condition(s)   Goals Addressed   None    Depression Screen    11/11/2022    1:45 PM 06/19/2020    9:56 AM 07/31/2017   10:10 AM 10/15/2016    1:52 PM 05/04/2015    3:11 PM  PHQ 2/9 Scores  PHQ - 2 Score 0 0 0 2 0  PHQ- 9 Score  0 0 5     Fall Risk    11/11/2022    1:42 PM 05/10/2022    9:51 AM 01/02/2021   11:35 AM 06/19/2020    9:56 AM 07/31/2017   10:10 AM  Fall Risk   Falls in the past year? 0 0 1 1 Yes  Number falls in past yr: 0 0 1 0 1  Injury with Fall? 0 0 1 1 No  Risk for fall due to : No Fall Risks No Fall Risks Impaired balance/gait    Follow up Education provided;Falls prevention discussed  Falls  evaluation completed  Falls prevention discussed    FALL RISK PREVENTION PERTAINING TO THE HOME:  Any stairs in or around the home? No  If so, are there any without handrails? No  Home free of loose throw rugs in walkways, pet beds, electrical cords, etc? Yes  Adequate lighting in your home to reduce risk of falls? Yes   ASSISTIVE DEVICES UTILIZED TO PREVENT FALLS:  Life alert? No  Use of a cane, walker or w/c? No  Grab bars in the bathroom? Yes  Shower chair or bench in shower? No  Elevated toilet seat or a handicapped toilet? No   Cognitive Function:        11/11/2022    2:00 PM  6CIT Screen  What Year? 0 points  What month? 0 points  What time? 0 points  Count back from 20 0 points  Months in reverse 0 points  Repeat phrase 6 points  Total Score 6 points    Immunizations Immunization History  Administered Date(s) Administered   Fluad Quad(high Dose 65+) 02/23/2020, 02/07/2021, 02/07/2022   Influenza, High Dose Seasonal PF 03/02/2015, 03/09/2016, 02/03/2017, 01/28/2018, 02/09/2019   Pneumococcal Conjugate-13 04/15/2014   Pneumococcal Polysaccharide-23 02/03/2017   Tdap 02/09/2011    TDAP status: Up to date  Flu Vaccine status: Up to date  Pneumococcal vaccine status: Up to date  Covid-19 vaccine status: Declined, Education has been provided regarding the importance of this vaccine but patient still declined. Advised may receive this vaccine at local pharmacy or Health Dept.or vaccine clinic. Aware to provide a copy of the vaccination record if obtained from local pharmacy or Health Dept. Verbalized acceptance and understanding.  Qualifies for Shingles Vaccine? Yes   Zostavax completed No   Shingrix Completed?: No.    Education has been provided regarding the importance of this vaccine. Patient has been advised to call insurance company to determine  out of pocket expense if they have not yet received this vaccine. Advised may also receive vaccine at local  pharmacy or Health Dept. Verbalized acceptance and understanding.  Screening Tests Health Maintenance  Topic Date Due   COVID-19 Vaccine (1) Never done   Zoster Vaccines- Shingrix (1 of 2) Never done   DTaP/Tdap/Td (2 - Td or Tdap) 02/08/2021   INFLUENZA VACCINE  12/26/2022   Medicare Annual Wellness (AWV)  11/11/2023   Pneumonia Vaccine 81+ Years old  Completed   DEXA SCAN  Completed   HPV VACCINES  Aged Out    Health Maintenance  Health Maintenance Due  Topic Date Due   COVID-19 Vaccine (1) Never done   Zoster Vaccines- Shingrix (1 of 2) Never done   DTaP/Tdap/Td (2 - Td or Tdap) 02/08/2021    Colorectal cancer screening: No longer required.   Mammogram status: No longer required due to age.  Bone Density status: Completed yes. Results reflect: Bone density results: OSTEOPOROSIS. Repeat every no longer required years.  Lung Cancer Screening: (Low Dose CT Chest recommended if Age 56-80 years, 30 pack-year currently smoking OR have quit w/in 15years.) does not qualify.   Lung Cancer Screening Referral: no  Additional Screening:  Hepatitis C Screening: does not qualify; Completed yes  Vision Screening: Recommended annual ophthalmology exams for early detection of glaucoma and other disorders of the eye. Is the patient up to date with their annual eye exam?  Yes  Who is the provider or what is the name of the office in which the patient attends annual eye exams? Dr Lillie Fragmin If pt is not established with a provider, would they like to be referred to a provider to establish care? No .   Dental Screening: Recommended annual dental exams for proper oral hygiene  Community Resource Referral / Chronic Care Management: CRR required this visit?  No   CCM required this visit?  No      Plan:     I have personally reviewed and noted the following in the patient's chart:   Medical and social history Use of alcohol, tobacco or illicit drugs  Current medications and  supplements including opioid prescriptions. Patient is not currently taking opioid prescriptions. Functional ability and status Nutritional status Physical activity Advanced directives List of other physicians Hospitalizations, surgeries, and ER visits in previous 12 months Vitals Screenings to include cognitive, depression, and falls Referrals and appointments  In addition, I have reviewed and discussed with patient certain preventive protocols, quality metrics, and best practice recommendations. A written personalized care plan for preventive services as well as general preventive health recommendations were provided to patient.     Sue Lush, LPN   1/61/0960   Nurse Notes: pt answered most of the questions for AWV (though sounding very reluctantly).. Pt at EOV states she does not desire to do the AWV again. Pt placed on do not call/schedule list.

## 2022-11-11 NOTE — Patient Instructions (Signed)
Darlene Serrano , Thank you for taking time to come for your Medicare Wellness Visit. I appreciate your ongoing commitment to your health goals. Please review the following plan we discussed and let me know if I can assist you in the future.   These are the goals we discussed:  Goals   None     This is a list of the screening recommended for you and due dates:  Health Maintenance  Topic Date Due   COVID-19 Vaccine (1) Never done   Zoster (Shingles) Vaccine (1 of 2) Never done   DTaP/Tdap/Td vaccine (2 - Td or Tdap) 02/08/2021   Flu Shot  12/26/2022   Medicare Annual Wellness Visit  11/11/2023   Pneumonia Vaccine  Completed   DEXA scan (bone density measurement)  Completed   HPV Vaccine  Aged Out    Advanced directives: declined to answer  Conditions/risks identified: low falls risk  Next appointment: Follow up in one year for your annual wellness visit declined   Preventive Care 65 Years and Older, Female Preventive care refers to lifestyle choices and visits with your health care provider that can promote health and wellness. What does preventive care include? A yearly physical exam. This is also called an annual well check. Dental exams once or twice a year. Routine eye exams. Ask your health care provider how often you should have your eyes checked. Personal lifestyle choices, including: Daily care of your teeth and gums. Regular physical activity. Eating a healthy diet. Avoiding tobacco and drug use. Limiting alcohol use. Practicing safe sex. Taking low-dose aspirin every day. Taking vitamin and mineral supplements as recommended by your health care provider. What happens during an annual well check? The services and screenings done by your health care provider during your annual well check will depend on your age, overall health, lifestyle risk factors, and family history of disease. Counseling  Your health care provider may ask you questions about your: Alcohol  use. Tobacco use. Drug use. Emotional well-being. Home and relationship well-being. Sexual activity. Eating habits. History of falls. Memory and ability to understand (cognition). Work and work Astronomer. Reproductive health. Screening  You may have the following tests or measurements: Height, weight, and BMI. Blood pressure. Lipid and cholesterol levels. These may be checked every 5 years, or more frequently if you are over 63 years old. Skin check. Lung cancer screening. You may have this screening every year starting at age 45 if you have a 30-pack-year history of smoking and currently smoke or have quit within the past 15 years. Fecal occult blood test (FOBT) of the stool. You may have this test every year starting at age 38. Flexible sigmoidoscopy or colonoscopy. You may have a sigmoidoscopy every 5 years or a colonoscopy every 10 years starting at age 73. Hepatitis C blood test. Hepatitis B blood test. Sexually transmitted disease (STD) testing. Diabetes screening. This is done by checking your blood sugar (glucose) after you have not eaten for a while (fasting). You may have this done every 1-3 years. Bone density scan. This is done to screen for osteoporosis. You may have this done starting at age 46. Mammogram. This may be done every 1-2 years. Talk to your health care provider about how often you should have regular mammograms. Talk with your health care provider about your test results, treatment options, and if necessary, the need for more tests. Vaccines  Your health care provider may recommend certain vaccines, such as: Influenza vaccine. This is recommended every year.  Tetanus, diphtheria, and acellular pertussis (Tdap, Td) vaccine. You Juanita need a Td booster every 10 years. Zoster vaccine. You Taina need this after age 103. Pneumococcal 13-valent conjugate (PCV13) vaccine. One dose is recommended after age 41. Pneumococcal polysaccharide (PPSV23) vaccine. One dose is  recommended after age 43. Talk to your health care provider about which screenings and vaccines you need and how often you need them. This information is not intended to replace advice given to you by your health care provider. Make sure you discuss any questions you have with your health care provider. Document Released: 06/09/2015 Document Revised: 01/31/2016 Document Reviewed: 03/14/2015 Elsevier Interactive Patient Education  2017 [place] Prevention in the Home Falls can cause injuries. They can happen to people of all ages. There are many things you can do to make your home safe and to help prevent falls. What can I do on the outside of my home? Regularly fix the edges of walkways and driveways and fix any cracks. Remove anything that might make you trip as you walk through a door, such as a raised step or threshold. Trim any bushes or trees on the path to your home. Use bright outdoor lighting. Clear any walking paths of anything that might make someone trip, such as rocks or tools. Regularly check to see if handrails are loose or broken. Make sure that both sides of any steps have handrails. Any raised decks and porches should have guardrails on the edges. Have any leaves, snow, or ice cleared regularly. Use sand or salt on walking paths during winter. Clean up any spills in your garage right away. This includes oil or grease spills. What can I do in the bathroom? Use night lights. Install grab bars by the toilet and in the tub and shower. Do not use towel bars as grab bars. Use non-skid mats or decals in the tub or shower. If you need to sit down in the shower, use a plastic, non-slip stool. Keep the floor dry. Clean up any water that spills on the floor as soon as it happens. Remove soap buildup in the tub or shower regularly. Attach bath mats securely with double-sided non-slip rug tape. Do not have throw rugs and other things on the floor that can make you  trip. What can I do in the bedroom? Use night lights. Make sure that you have a light by your bed that is easy to reach. Do not use any sheets or blankets that are too big for your bed. They should not hang down onto the floor. Have a firm chair that has side arms. You can use this for support while you get dressed. Do not have throw rugs and other things on the floor that can make you trip. What can I do in the kitchen? Clean up any spills right away. Avoid walking on wet floors. Keep items that you use a lot in easy-to-reach places. If you need to reach something above you, use a strong step stool that has a grab bar. Keep electrical cords out of the way. Do not use floor polish or wax that makes floors slippery. If you must use wax, use non-skid floor wax. Do not have throw rugs and other things on the floor that can make you trip. What can I do with my stairs? Do not leave any items on the stairs. Make sure that there are handrails on both sides of the stairs and use them. Fix handrails that are broken or loose.  Make sure that handrails are as long as the stairways. Check any carpeting to make sure that it is firmly attached to the stairs. Fix any carpet that is loose or worn. Avoid having throw rugs at the top or bottom of the stairs. If you do have throw rugs, attach them to the floor with carpet tape. Make sure that you have a light switch at the top of the stairs and the bottom of the stairs. If you do not have them, ask someone to add them for you. What else can I do to help prevent falls? Wear shoes that: Do not have high heels. Have rubber bottoms. Are comfortable and fit you well. Are closed at the toe. Do not wear sandals. If you use a stepladder: Make sure that it is fully opened. Do not climb a closed stepladder. Make sure that both sides of the stepladder are locked into place. Ask someone to hold it for you, if possible. Clearly mark and make sure that you can  see: Any grab bars or handrails. First and last steps. Where the edge of each step is. Use tools that help you move around (mobility aids) if they are needed. These include: Canes. Walkers. Scooters. Crutches. Turn on the lights when you go into a dark area. Replace any light bulbs as soon as they burn out. Set up your furniture so you have a clear path. Avoid moving your furniture around. If any of your floors are uneven, fix them. If there are any pets around you, be aware of where they are. Review your medicines with your doctor. Some medicines can make you feel dizzy. This can increase your chance of falling. Ask your doctor what other things that you can do to help prevent falls. This information is not intended to replace advice given to you by your health care provider. Make sure you discuss any questions you have with your health care provider. Document Released: 03/09/2009 Document Revised: 10/19/2015 Document Reviewed: 06/17/2014 Elsevier Interactive Patient Education  2017 [employer].

## 2022-11-15 DIAGNOSIS — D225 Melanocytic nevi of trunk: Secondary | ICD-10-CM | POA: Diagnosis not present

## 2022-11-15 DIAGNOSIS — D2262 Melanocytic nevi of left upper limb, including shoulder: Secondary | ICD-10-CM | POA: Diagnosis not present

## 2022-11-15 DIAGNOSIS — D2261 Melanocytic nevi of right upper limb, including shoulder: Secondary | ICD-10-CM | POA: Diagnosis not present

## 2022-11-15 DIAGNOSIS — D224 Melanocytic nevi of scalp and neck: Secondary | ICD-10-CM | POA: Diagnosis not present

## 2022-11-15 DIAGNOSIS — L821 Other seborrheic keratosis: Secondary | ICD-10-CM | POA: Diagnosis not present

## 2022-11-15 DIAGNOSIS — L570 Actinic keratosis: Secondary | ICD-10-CM | POA: Diagnosis not present

## 2022-11-15 DIAGNOSIS — D1801 Hemangioma of skin and subcutaneous tissue: Secondary | ICD-10-CM | POA: Diagnosis not present

## 2022-11-15 DIAGNOSIS — Z85828 Personal history of other malignant neoplasm of skin: Secondary | ICD-10-CM | POA: Diagnosis not present

## 2022-11-26 ENCOUNTER — Other Ambulatory Visit: Payer: Self-pay | Admitting: Family Medicine

## 2022-11-26 ENCOUNTER — Encounter: Payer: Self-pay | Admitting: Family Medicine

## 2022-11-26 DIAGNOSIS — Z1231 Encounter for screening mammogram for malignant neoplasm of breast: Secondary | ICD-10-CM

## 2023-01-06 ENCOUNTER — Ambulatory Visit (INDEPENDENT_AMBULATORY_CARE_PROVIDER_SITE_OTHER): Payer: Medicare Other | Admitting: Family Medicine

## 2023-01-06 VITALS — BP 144/64 | HR 59 | Temp 98.0°F | Wt 113.0 lb

## 2023-01-06 DIAGNOSIS — J0110 Acute frontal sinusitis, unspecified: Secondary | ICD-10-CM | POA: Diagnosis not present

## 2023-01-06 MED ORDER — AZITHROMYCIN 250 MG PO TABS: ORAL_TABLET | ORAL | 0 refills | Status: AC

## 2023-01-06 NOTE — Progress Notes (Signed)
Established patient visit   Patient: Darlene Serrano   DOB: Dec 19, 1934   88 y.o. Female  MRN: [id number] Visit Date: 01/06/2023  Today's healthcare provider: Mila Merry, MD   Chief Complaint  Patient presents with   Sore Throat    Associated with ear aches and headaches   Subjective    Discussed the use of AI scribe software for clinical note transcription with the patient, who gave verbal consent to proceed.  History of Present Illness   The patient, a resident of [address], initially developed severe right earache three days ago. The pain subsequently subsided, and the patient reported feeling better. However, on Monday morning, the patient woke up feeling unwell again. The patient described the right ear as feeling "funny," as if it was filled with something, but the pain was not as severe as before.  In addition to the ear discomfort, the patient reported feeling stuffy overall, with a particular emphasis on the head and sinuses. The patient also reported waking up with a cough. Despite these symptoms, the patient tested negative for COVID-19 twice, once on Saturday and again on Monday.  The patient also reported a sore throat, particularly on right side, which was severe in the morning but improved over the course of the day. The patient denied any chest discomfort or difficulty breathing, and the lungs were reported to sound normal upon examination.  The patient has a known allergy to penicillin but has previously tolerated Zithromax (Z-Pak) well. The patient requested that the prescription be sent to [facility] for delivery, as they are unable to drive.       Medications: Outpatient Medications Prior to Visit  Medication Sig   apixaban (ELIQUIS) 2.5 MG TABS tablet Take by mouth 2 (two) times daily.   atorvastatin (LIPITOR) 10 MG tablet Take 1 tablet (10 mg total) by mouth every morning.   calcium carbonate (OS-CAL) 600 MG TABS tablet Take 600 mg by mouth  daily.    denosumab (PROLIA) 60 MG/ML SOSY injection Inject 60 mg into the skin every 6 (six) months.   Fexofenadine HCl (ALLEGRA PO) Take by mouth.   flecainide (TAMBOCOR) 50 MG tablet Take 50 mg by mouth 3 (three) times daily.   furosemide (LASIX) 20 MG tablet TAKE ONE TABLET EVERY OTHER DAY   Multiple Vitamins-Minerals (ICAPS PO) Take 1 tablet by mouth 2 (two) times daily.   No facility-administered medications prior to visit.      Objective    BP (!) 144/64   Pulse (!) 59   Temp 98 F (36.7 C) (Oral)   Wt 113 lb (51.3 kg)   BMI 18.80 kg/m   Physical Exam  General Appearance:    Well developed, well nourished female, alert, cooperative, in no acute distress  HENT:   bilateral TM normal without fluid or infection and neck has bilateral anterior cervical nodes enlarged. Tender frontal sinuses.   Eyes:    PERRL, conjunctiva/corneas clear, EOM's intact       Lungs:     Clear to auscultation bilaterally, respirations unlabored  Heart:    Bradycardic. Regular rhythm.  1/6 blowing, holosystolic murmur at apex   Neurologic:   Awake, alert, oriented x 3. No apparent focal neurological           defect.         Assessment & Plan     Assessment and Plan    Sinusitus Symptoms of earache, stuffiness, and sore throat. Physical  exam findings of stuffy nose and sore throat. No signs of lower respiratory involvement. Negative COVID-19 tests. -Prescribe Zithromax (Z-Pak) due to penicillin allergy. -Request delivery from [facility] today.     Call if symptoms change or if not rapidly improving.          Mila Merry, MD  [facility] [phone] (phone) [phone] (fax)  [facility]

## 2023-01-22 ENCOUNTER — Ambulatory Visit (INDEPENDENT_AMBULATORY_CARE_PROVIDER_SITE_OTHER): Payer: Medicare Other | Admitting: Physician Assistant

## 2023-01-22 ENCOUNTER — Encounter: Payer: Self-pay | Admitting: Physician Assistant

## 2023-01-22 VITALS — BP 145/55 | HR 61 | Ht 65.0 in | Wt 126.7 lb

## 2023-01-22 DIAGNOSIS — R0981 Nasal congestion: Secondary | ICD-10-CM | POA: Diagnosis not present

## 2023-01-22 DIAGNOSIS — J029 Acute pharyngitis, unspecified: Secondary | ICD-10-CM | POA: Diagnosis not present

## 2023-01-22 DIAGNOSIS — H6691 Otitis media, unspecified, right ear: Secondary | ICD-10-CM

## 2023-01-22 DIAGNOSIS — Z0000 Encounter for general adult medical examination without abnormal findings: Secondary | ICD-10-CM

## 2023-01-22 DIAGNOSIS — H9201 Otalgia, right ear: Secondary | ICD-10-CM

## 2023-01-22 MED ORDER — DOXYCYCLINE HYCLATE 100 MG PO TABS: 100.0000 mg | ORAL_TABLET | Freq: Two times a day (BID) | ORAL | 0 refills | Status: DC

## 2023-01-22 NOTE — Progress Notes (Unsigned)
Established patient visit  Patient: Darlene Serrano   DOB: 1934-06-10   88 y.o. Female  MRN: [id number] Visit Date: 01/22/2023  Today's healthcare provider: Debera Lat, PA-C   Chief Complaint  Patient presents with   Medical Management of Chronic Issues    Pain in right ear, Pain had become better but came back, pain radiates to the throat    Subjective     Discussed the use of AI scribe software for clinical note transcription with the patient, who gave verbal consent to proceed.  History of Present Illness   The patient, with a history of penicillin and codeine allergies, presents with ongoing ear and throat pain. They report a recent infection in the ear that was treated with a Z-Pak (azithromycin) prescribed by Dr. Chandra Batch. Despite completing the course of antibiotics, the patient continues to experience discomfort in the ear and throat. They describe the ear as 'stopped up' and the throat as 'sore.' The patient also reports nasal congestion and post-nasal drip. They have been managing a fever with ibuprofen. The patient expresses a desire for further antibiotic treatment to alleviate their symptoms.           11/11/2022    1:45 PM 06/19/2020    9:56 AM 07/31/2017   10:10 AM  Depression screen PHQ 2/9  Decreased Interest 0 0 0  Down, Depressed, Hopeless 0 0 0  PHQ - 2 Score 0 0 0  Altered sleeping  0 0  Tired, decreased energy  0 0  Change in appetite  0 0  Feeling bad or failure about yourself   0 0  Trouble concentrating  0   Moving slowly or fidgety/restless  0 0  Suicidal thoughts  0 0  PHQ-9 Score  0 0  Difficult doing work/chores  Not difficult at all Not difficult at all       No data to display          Medications: Outpatient Medications Prior to Visit  Medication Sig   apixaban (ELIQUIS) 2.5 MG TABS tablet Take by mouth 2 (two) times daily.   atorvastatin (LIPITOR) 10 MG tablet Take 1 tablet (10 mg total) by mouth every morning.   calcium carbonate  (OS-CAL) 600 MG TABS tablet Take 600 mg by mouth daily.    denosumab (PROLIA) 60 MG/ML SOSY injection Inject 60 mg into the skin every 6 (six) months.   Fexofenadine HCl (ALLEGRA PO) Take by mouth.   flecainide (TAMBOCOR) 50 MG tablet Take 50 mg by mouth 3 (three) times daily.   furosemide (LASIX) 20 MG tablet TAKE ONE TABLET EVERY OTHER DAY   Multiple Vitamins-Minerals (ICAPS PO) Take 1 tablet by mouth 2 (two) times daily.   No facility-administered medications prior to visit.    Review of Systems  All other systems reviewed and are negative.  Except see HPI   {Insert previous labs (optional):23779} {See past labs  Heme  Chem  Endocrine  Serology  Results Review (optional):1}   Objective    BP (!) 145/55 (BP Location: Left Arm, Patient Position: Sitting, Cuff Size: Normal)   Pulse 61   Ht 5\' 5"  (1.651 m)   Wt 126 lb 11.2 oz (57.5 kg)   SpO2 98%   BMI 21.08 kg/m  {Insert last BP/Wt (optional):23777}{See vitals history (optional):1}   Physical Exam   No results found for any visits on 01/22/23.  Assessment & Plan        Upper Respiratory Infection Patient presents with earache,  throat pain, and nasal congestion. Recent history of antibiotic use (Z-Pak) with no improvement, suggesting a possible viral etiology. However, patient insists on further antibiotic treatment. -Prescribe Doxycycline, sent to [facility]. -Advise warm salt gargle and nasal saline spray for symptomatic relief.  Patient-Provider Relationship Patient expressed dissatisfaction with care due to language barrier and provider's role (Physician Assistant vs Doctor). -Attempted to reassure patient of competency and ability to prescribe necessary medications. -Consider discussing this encounter with supervising physician for further guidance and potential follow-up with patient.     No follow-ups on file.  The patient was advised to call back or seek an in-person evaluation if the symptoms worsen or  if the condition fails to improve as anticipated.  I discussed the assessment and treatment plan with the patient. The patient was provided an opportunity to ask questions and all were answered. The patient agreed with the plan and demonstrated an understanding of the instructions.  I, Debera Lat, PA-C have reviewed all documentation for this visit. The documentation on 01/22/23  for the exam, diagnosis, procedures, and orders are all accurate and complete.  Debera Lat, [facility], MMS [facility] [phone] (phone) [phone] (fax)   [facility]

## 2023-01-23 DIAGNOSIS — I480 Paroxysmal atrial fibrillation: Secondary | ICD-10-CM | POA: Diagnosis not present

## 2023-01-23 DIAGNOSIS — E782 Mixed hyperlipidemia: Secondary | ICD-10-CM | POA: Diagnosis not present

## 2023-01-23 DIAGNOSIS — Z79899 Other long term (current) drug therapy: Secondary | ICD-10-CM | POA: Diagnosis not present

## 2023-01-23 DIAGNOSIS — Z5181 Encounter for therapeutic drug level monitoring: Secondary | ICD-10-CM | POA: Diagnosis not present

## 2023-01-23 DIAGNOSIS — I493 Ventricular premature depolarization: Secondary | ICD-10-CM | POA: Diagnosis not present

## 2023-01-23 DIAGNOSIS — I5032 Chronic diastolic (congestive) heart failure: Secondary | ICD-10-CM | POA: Diagnosis not present

## 2023-01-23 DIAGNOSIS — R001 Bradycardia, unspecified: Secondary | ICD-10-CM | POA: Diagnosis not present

## 2023-01-24 NOTE — Progress Notes (Incomplete)
Established patient visit  Patient: Darlene Serrano   DOB: 03-09-1935   88 y.o. Female  MRN: [id number] Visit Date: 01/22/2023  Today's healthcare provider: Debera Lat, PA-C   Chief Complaint  Patient presents with  . Medical Management of Chronic Issues    Pain in right ear, Pain had become better but came back, pain radiates to the throat    Subjective     Discussed the use of AI scribe software for clinical note transcription with the patient, who gave verbal consent to proceed.  History of Present Illness   The patient, with a history of penicillin and codeine allergies, presents with ongoing ear and throat pain. They report a recent infection in the ear that was treated with a Z-Pak (azithromycin) prescribed by Dr. Chandra Batch. Despite completing the course of antibiotics, the patient continues to experience discomfort in the ear and throat. They describe the ear as 'stopped up' and the throat as 'sore.' The patient also reports nasal congestion and post-nasal drip. They have been managing a fever with ibuprofen. The patient expresses a desire for further antibiotic treatment to alleviate their symptoms.           11/11/2022    1:45 PM 06/19/2020    9:56 AM 07/31/2017   10:10 AM  Depression screen PHQ 2/9  Decreased Interest 0 0 0  Down, Depressed, Hopeless 0 0 0  PHQ - 2 Score 0 0 0  Altered sleeping  0 0  Tired, decreased energy  0 0  Change in appetite  0 0  Feeling bad or failure about yourself   0 0  Trouble concentrating  0   Moving slowly or fidgety/restless  0 0  Suicidal thoughts  0 0  PHQ-9 Score  0 0  Difficult doing work/chores  Not difficult at all Not difficult at all       No data to display          Medications: Outpatient Medications Prior to Visit  Medication Sig  . apixaban (ELIQUIS) 2.5 MG TABS tablet Take by mouth 2 (two) times daily.  Marland Kitchen atorvastatin (LIPITOR) 10 MG tablet Take 1 tablet (10 mg total) by mouth every morning.  . calcium  carbonate (OS-CAL) 600 MG TABS tablet Take 600 mg by mouth daily.   Marland Kitchen denosumab (PROLIA) 60 MG/ML SOSY injection Inject 60 mg into the skin every 6 (six) months.  . Fexofenadine HCl (ALLEGRA PO) Take by mouth.  . flecainide (TAMBOCOR) 50 MG tablet Take 50 mg by mouth 3 (three) times daily.  . furosemide (LASIX) 20 MG tablet TAKE ONE TABLET EVERY OTHER DAY  . Multiple Vitamins-Minerals (ICAPS PO) Take 1 tablet by mouth 2 (two) times daily.   No facility-administered medications prior to visit.    Review of Systems  All other systems reviewed and are negative.  Except see HPI   {Insert previous labs (optional):23779} {See past labs  Heme  Chem  Endocrine  Serology  Results Review (optional):1}   Objective    BP (!) 145/55 (BP Location: Left Arm, Patient Position: Sitting, Cuff Size: Normal)   Pulse 61   Ht 5\' 5"  (1.651 m)   Wt 126 lb 11.2 oz (57.5 kg)   SpO2 98%   BMI 21.08 kg/m  {Insert last BP/Wt (optional):23777}{See vitals history (optional):1}   Physical Exam   No results found for any visits on 01/22/23.  Assessment & Plan        Upper Respiratory Infection Patient presents with earache,  throat pain, and nasal congestion. Recent history of antibiotic use (Z-Pak) with no improvement, suggesting a possible viral etiology. However, patient insists on further antibiotic treatment. -Prescribe Doxycycline, sent to [facility]. -Advise warm salt gargle and nasal saline spray for symptomatic relief.  Patient-Provider Relationship Patient expressed dissatisfaction with care due to language barrier and provider's role (Physician Assistant vs Doctor). -Attempted to reassure patient of competency and ability to prescribe necessary medications. -Consider discussing this encounter with supervising physician for further guidance and potential follow-up with patient.     No follow-ups on file.  The patient was advised to call back or seek an in-person evaluation if the  symptoms worsen or if the condition fails to improve as anticipated.  I discussed the assessment and treatment plan with the patient. The patient was provided an opportunity to ask questions and all were answered. The patient agreed with the plan and demonstrated an understanding of the instructions.  I, Debera Lat, PA-C have reviewed all documentation for this visit. The documentation on 01/22/23  for the exam, diagnosis, procedures, and orders are all accurate and complete.  Debera Lat, [facility], MMS [facility] [phone] (phone) [phone] (fax)   [facility]

## 2023-02-06 ENCOUNTER — Ambulatory Visit
Admission: RE | Admit: 2023-02-06 | Discharge: 2023-02-06 | Disposition: A | Discharge status: Home / Self Care | Payer: Medicare Other | Source: Ambulatory Visit | Attending: Family Medicine | Admitting: Family Medicine

## 2023-02-06 DIAGNOSIS — Z1231 Encounter for screening mammogram for malignant neoplasm of breast: Secondary | ICD-10-CM | POA: Diagnosis not present

## 2023-02-10 DIAGNOSIS — I493 Ventricular premature depolarization: Secondary | ICD-10-CM | POA: Diagnosis not present

## 2023-02-13 ENCOUNTER — Encounter: Payer: Self-pay | Admitting: Family Medicine

## 2023-02-19 DIAGNOSIS — I340 Nonrheumatic mitral (valve) insufficiency: Secondary | ICD-10-CM | POA: Diagnosis not present

## 2023-02-19 DIAGNOSIS — R002 Palpitations: Secondary | ICD-10-CM | POA: Diagnosis not present

## 2023-02-19 DIAGNOSIS — I480 Paroxysmal atrial fibrillation: Secondary | ICD-10-CM | POA: Diagnosis not present

## 2023-02-24 DIAGNOSIS — I480 Paroxysmal atrial fibrillation: Secondary | ICD-10-CM | POA: Diagnosis not present

## 2023-02-24 DIAGNOSIS — I4729 Other ventricular tachycardia: Secondary | ICD-10-CM | POA: Diagnosis not present

## 2023-02-24 DIAGNOSIS — E782 Mixed hyperlipidemia: Secondary | ICD-10-CM | POA: Diagnosis not present

## 2023-03-04 DIAGNOSIS — I480 Paroxysmal atrial fibrillation: Secondary | ICD-10-CM | POA: Diagnosis not present

## 2023-03-04 DIAGNOSIS — I4729 Other ventricular tachycardia: Secondary | ICD-10-CM | POA: Diagnosis not present

## 2023-03-24 DIAGNOSIS — E782 Mixed hyperlipidemia: Secondary | ICD-10-CM | POA: Diagnosis not present

## 2023-03-24 DIAGNOSIS — I340 Nonrheumatic mitral (valve) insufficiency: Secondary | ICD-10-CM | POA: Diagnosis not present

## 2023-03-24 DIAGNOSIS — I480 Paroxysmal atrial fibrillation: Secondary | ICD-10-CM | POA: Diagnosis not present

## 2023-03-24 DIAGNOSIS — I493 Ventricular premature depolarization: Secondary | ICD-10-CM | POA: Diagnosis not present

## 2023-04-07 DIAGNOSIS — H353211 Exudative age-related macular degeneration, right eye, with active choroidal neovascularization: Secondary | ICD-10-CM | POA: Diagnosis not present

## 2023-04-07 DIAGNOSIS — H26493 Other secondary cataract, bilateral: Secondary | ICD-10-CM | POA: Diagnosis not present

## 2023-07-18 ENCOUNTER — Emergency Department
Admission: EM | Admit: 2023-07-18 | Discharge: 2023-07-18 | Disposition: A | Discharge status: Home / Self Care | Payer: Medicare Other | Attending: Emergency Medicine | Admitting: Emergency Medicine

## 2023-07-18 ENCOUNTER — Emergency Department: Payer: Medicare Other

## 2023-07-18 ENCOUNTER — Other Ambulatory Visit: Payer: Self-pay

## 2023-07-18 DIAGNOSIS — R0789 Other chest pain: Secondary | ICD-10-CM | POA: Diagnosis not present

## 2023-07-18 DIAGNOSIS — R051 Acute cough: Secondary | ICD-10-CM | POA: Diagnosis not present

## 2023-07-18 DIAGNOSIS — R1110 Vomiting, unspecified: Secondary | ICD-10-CM | POA: Diagnosis not present

## 2023-07-18 DIAGNOSIS — I6782 Cerebral ischemia: Secondary | ICD-10-CM | POA: Diagnosis not present

## 2023-07-18 DIAGNOSIS — I509 Heart failure, unspecified: Secondary | ICD-10-CM | POA: Insufficient documentation

## 2023-07-18 DIAGNOSIS — M7989 Other specified soft tissue disorders: Secondary | ICD-10-CM | POA: Diagnosis not present

## 2023-07-18 DIAGNOSIS — Z7901 Long term (current) use of anticoagulants: Secondary | ICD-10-CM | POA: Diagnosis not present

## 2023-07-18 DIAGNOSIS — J45909 Unspecified asthma, uncomplicated: Secondary | ICD-10-CM | POA: Diagnosis not present

## 2023-07-18 DIAGNOSIS — R29818 Other symptoms and signs involving the nervous system: Secondary | ICD-10-CM | POA: Diagnosis not present

## 2023-07-18 DIAGNOSIS — R531 Weakness: Secondary | ICD-10-CM | POA: Insufficient documentation

## 2023-07-18 DIAGNOSIS — R2681 Unsteadiness on feet: Secondary | ICD-10-CM | POA: Diagnosis not present

## 2023-07-18 DIAGNOSIS — R112 Nausea with vomiting, unspecified: Secondary | ICD-10-CM | POA: Insufficient documentation

## 2023-07-18 DIAGNOSIS — Z03818 Encounter for observation for suspected exposure to other biological agents ruled out: Secondary | ICD-10-CM | POA: Diagnosis not present

## 2023-07-18 DIAGNOSIS — R5383 Other fatigue: Secondary | ICD-10-CM | POA: Diagnosis not present

## 2023-07-18 LAB — URINALYSIS, W/ REFLEX TO CULTURE (INFECTION SUSPECTED)
Bilirubin Urine: NEGATIVE
Glucose, UA: NEGATIVE mg/dL
Ketones, ur: NEGATIVE mg/dL
Nitrite: NEGATIVE
Protein, ur: NEGATIVE mg/dL
Specific Gravity, Urine: 1.021 (ref 1.005–1.030)
pH: 5 (ref 5.0–8.0)

## 2023-07-18 LAB — HEPATIC FUNCTION PANEL
ALT: 22 U/L (ref 0–44)
AST: 27 U/L (ref 15–41)
Albumin: 3.9 g/dL (ref 3.5–5.0)
Alkaline Phosphatase: 55 U/L (ref 38–126)
Bilirubin, Direct: 0.2 mg/dL (ref 0.0–0.2)
Indirect Bilirubin: 0.8 mg/dL (ref 0.3–0.9)
Total Bilirubin: 1 mg/dL (ref 0.0–1.2)
Total Protein: 6.5 g/dL (ref 6.5–8.1)

## 2023-07-18 LAB — CBC
HCT: 45.2 % (ref 36.0–46.0)
Hemoglobin: 14.8 g/dL (ref 12.0–15.0)
MCH: 31.8 pg (ref 26.0–34.0)
MCHC: 32.7 g/dL (ref 30.0–36.0)
MCV: 97.2 fL (ref 80.0–100.0)
Platelets: 206 10*3/uL (ref 150–400)
RBC: 4.65 MIL/uL (ref 3.87–5.11)
RDW: 14.4 % (ref 11.5–15.5)
WBC: 8.6 10*3/uL (ref 4.0–10.5)
nRBC: 0 % (ref 0.0–0.2)

## 2023-07-18 LAB — BRAIN NATRIURETIC PEPTIDE: B Natriuretic Peptide: 115.3 pg/mL — ABNORMAL HIGH (ref 0.0–100.0)

## 2023-07-18 LAB — BASIC METABOLIC PANEL
Anion gap: 11 (ref 5–15)
BUN: 26 mg/dL — ABNORMAL HIGH (ref 8–23)
CO2: 24 mmol/L (ref 22–32)
Calcium: 8.9 mg/dL (ref 8.9–10.3)
Chloride: 105 mmol/L (ref 98–111)
Creatinine, Ser: 0.83 mg/dL (ref 0.44–1.00)
GFR, Estimated: >60 mL/min (ref >60)
Glucose, Bld: 123 mg/dL — ABNORMAL HIGH (ref 70–99)
Potassium: 4.4 mmol/L (ref 3.5–5.1)
Sodium: 140 mmol/L (ref 135–145)

## 2023-07-18 LAB — TROPONIN I (HIGH SENSITIVITY): Troponin I (High Sensitivity): 6 ng/L (ref <18)

## 2023-07-18 LAB — APTT: aPTT: 26 s (ref 24–36)

## 2023-07-18 LAB — LIPASE, BLOOD: Lipase: 29 U/L (ref 11–51)

## 2023-07-18 LAB — LACTIC ACID, PLASMA: Lactic Acid, Venous: 1.3 mmol/L (ref 0.5–1.9)

## 2023-07-18 MED ORDER — FOSFOMYCIN TROMETHAMINE 3 G PO PACK
3.0000 g | PACK | Freq: Once | ORAL | Status: AC
  Administered 2023-07-18: 3 g via ORAL
  Filled 2023-07-18: qty 3

## 2023-07-18 MED ORDER — LACTATED RINGERS IV BOLUS
1000.0000 mL | Freq: Once | INTRAVENOUS | Status: AC
  Administered 2023-07-18: 1000 mL via INTRAVENOUS

## 2023-07-18 MED ORDER — ONDANSETRON HCL 4 MG/2ML IJ SOLN
4.0000 mg | Freq: Once | INTRAMUSCULAR | Status: DC
  Filled 2023-07-18: qty 2

## 2023-07-18 MED ORDER — ONDANSETRON 4 MG PO TBDP: 4.0000 mg | ORAL_TABLET | Freq: Three times a day (TID) | ORAL | 0 refills | Status: AC | PRN

## 2023-07-18 NOTE — ED Notes (Signed)
See triage note. Pt reports emesis last night. Denies pain at this time. Call bell within reach.

## 2023-07-18 NOTE — ED Triage Notes (Signed)
Pt here from [facility] with vomiting and weakness. Pt started vomiting this morning that lasted for 3-4 hours, she never left the bathroom floor. Pt has not tried to eat or drink anything yet or take any of her medications, worried that she may vomit. Denies diarrhea, no cold symptoms before this began. Pt is very tired looking and unsteady gait.States that her mouth is dry. Pt denies any abd pain. Pt states she just feels tired right now.

## 2023-07-18 NOTE — ED Triage Notes (Signed)
Pt here from [facility], per CMA pt has been weak, vomiting from 2a-7a, labs, EKG and CXR completed at [facility].

## 2023-07-18 NOTE — ED Provider Notes (Signed)
-----------------------------------------   6:08 PM on 07/18/2023 ----------------------------------------- Patient care assumed from prior provider.  Patient's urinalysis does show possibility of a mild urinary tract infection.  We will send a urine culture and dose a one-time dose of fosfomycin in the emergency department.  The rest of the patient's workup is reassuring with a normal CBC chemistry LFTs.  MRI of the brain is negative, CT scan head shows no acute finding.  Ultrasound of the leg is negative for DVT and chest x-ray is clear.  Patient and family member very reassured by today's workup and patient is anxious to go home.  Will discharge the patient home with outpatient.   Minna Antis, MD 07/18/23 [phone]

## 2023-07-18 NOTE — ED Provider Notes (Signed)
[facility] Provider Note    Event Date/Time   First MD Initiated Contact with Patient 07/18/23 1406     (approximate)   History   Weakness   HPI  Darlene Serrano is a 89 y.o. female with history of A-fib on Eliquis, CHF, macular degeneration, asthma, no prior history of CVA presenting with nausea vomiting.  Patient states that she woke up today vomiting.  No nausea.  No headaches.  Denies dizziness or lightheadedness.  No chest pain or shortness of breath, denies weakness or numbness.  No trauma or falls recently.  Has not been able to take her medications due to nausea vomiting.  She denies any fevers or cough, no urinary symptoms.  Was sent in from her primary care doctor's office.  Patient told the primary care doctor that she had been vomiting since 2 AM.  Had extreme fatigue.  Patient denies any dizziness or lightheadedness when she was standing to walk.  Independent history obtained from daughter, she also noted that patient was having some unsteady gait.  Daughter last saw her normal on Saturday.  Patient states that she went for her usual walk yesterday around 3 PM and that was normal.  On independent chart review, she had a respiratory viral panel done at the clinic that was negative.  Also had CBC, CMP that was done without electrolyte derangements, creatinine is normal, no leukocytosis.  Was sent to the emergency department for further management.   Physical Exam   Triage Vital Signs: ED Triage Vitals [07/18/23 1131]  Encounter Vitals Group     BP (!) 135/50     Systolic BP Percentile      Diastolic BP Percentile      Pulse Rate 73     Resp 17     Temp 97.6 F (36.4 C)     Temp Source Oral     SpO2 98 %     Weight 126 lb 12.2 oz (57.5 kg)     Height 5\' 5"  (1.651 m)     Head Circumference      Peak Flow      Pain Score 0     Pain Loc      Pain Education      Exclude from Growth Chart     Most recent vital signs: Vitals:   07/18/23  1430 07/18/23 1500  BP: (!) 122/91 (!) 136/57  Pulse: 78 73  Resp:    Temp:    SpO2: 97% 97%     General: Awake, no distress.  CV:  Good peripheral perfusion.  Resp:  Normal effort.  Clear Abd:  No distention.  Abdomen soft nontender Other:  Mild swelling on the left compared to the right.  This is new per daughter.  Pupils equal and reactive, extraocular movements are intact, no dysmetria, renal nerves are intact, no focal weakness or numbness.  Was able to get her up to stand but she has unsteady gait and would veer to the right.   ED Results / Procedures / Treatments   Labs (all labs ordered are listed, but only abnormal results are displayed) Labs Reviewed  BASIC METABOLIC PANEL - Abnormal; Notable for the following components:      Result Value   Glucose, Bld 123 (*)    BUN 26 (*)    All other components within normal limits  BRAIN NATRIURETIC PEPTIDE - Abnormal; Notable for the following components:   B Natriuretic Peptide 115.3 (*)  All other components within normal limits  CBC  APTT  LIPASE, BLOOD  LACTIC ACID, PLASMA  HEPATIC FUNCTION PANEL  URINALYSIS, W/ REFLEX TO CULTURE (INFECTION SUSPECTED)  CBG MONITORING, ED  TROPONIN I (HIGH SENSITIVITY)  TROPONIN I (HIGH SENSITIVITY)     EKG  Sinus rhythm, rate 78, normal QRS, normal QTc, T wave flattening to inferior lateral leads, no ischemic ST elevation, no prior to compare.   RADIOLOGY Chest x-ray on my interpretation without focal consolidation.   PROCEDURES:  Critical Care performed: No  Procedures   MEDICATIONS ORDERED IN ED: Medications  ondansetron (ZOFRAN) injection 4 mg (4 mg Intravenous Patient Refused/Not Given 07/18/23 1446)  lactated ringers bolus 1,000 mL (1,000 mLs Intravenous New Bag/Given 07/18/23 1424)     IMPRESSION / MDM / ASSESSMENT AND PLAN / ED COURSE  I reviewed the triage vital signs and the nursing notes.                              Differential diagnosis includes,  but is not limited to, CVA, TIA, electrolyte derangements, dehydration, gastroenteritis, UTI, ACS, pneumonia.  Patient is outside of tPA window given that daughter last saw her normal on Saturday and patient states that she does not know when the gait instability started.  Said that she was able to complete her normal walk at 3 PM yesterday.  Will get additional labs, BNP, LFTs, lactic, UA, troponin.  Will give her some fluids, give her some Zofran.  For the left lower extremity swelling, will get an ultrasound of her leg to rule out DVT.  Will also do a CT as well as an MRI.  Reassess.  Patient's presentation is most consistent with acute presentation with potential threat to life or bodily function.  Independent review of labs and imaging are below.  Patient is still pending her CT imaging, ultrasound, MRI.  Signed out pending all those labs, on reassessment if she is feeling a lot better, no stroke on CT, likely can DC home..  Clinical Course as of 07/18/23 1543  Fri Jul 18, 2023  1459 Independent review of labs, electrolyte severely deranged, creatinine is normal, no leukocytosis, lactate and troponin are negative, LFTs are not elevated, lipase is normal. [TT]  1459 UA still pending. [TT]  1537 Patient signed out to oncoming team pending CT imaging, ultrasound results as well as MR results.  UA still pending.  If negative for stroke and symptoms improved, likely able to be discharged home. [TT]    Clinical Course User Index [TT] Jodie Echevaria, Franchot Erichsen, MD     FINAL CLINICAL IMPRESSION(S) / ED DIAGNOSES   Final diagnoses:  Weakness  Nausea and vomiting, unspecified vomiting type  Unsteady gait     Rx / DC Orders   ED Discharge Orders     None        Note:  This document was prepared using Dragon voice recognition software and may include unintentional dictation errors.    Claybon Jabs, MD 07/18/23 [phone]

## 2023-07-20 LAB — URINE CULTURE: Culture: 20000 — AB

## 2023-07-21 DIAGNOSIS — I4729 Other ventricular tachycardia: Secondary | ICD-10-CM | POA: Diagnosis not present

## 2023-07-21 DIAGNOSIS — Z5181 Encounter for therapeutic drug level monitoring: Secondary | ICD-10-CM | POA: Diagnosis not present

## 2023-07-21 DIAGNOSIS — Z79899 Other long term (current) drug therapy: Secondary | ICD-10-CM | POA: Diagnosis not present

## 2023-07-21 DIAGNOSIS — I493 Ventricular premature depolarization: Secondary | ICD-10-CM | POA: Diagnosis not present

## 2023-07-21 DIAGNOSIS — I480 Paroxysmal atrial fibrillation: Secondary | ICD-10-CM | POA: Diagnosis not present

## 2023-07-21 DIAGNOSIS — E782 Mixed hyperlipidemia: Secondary | ICD-10-CM | POA: Diagnosis not present

## 2023-07-22 ENCOUNTER — Telehealth: Payer: Self-pay

## 2023-07-22 NOTE — Transitions of Care (Post Inpatient/ED Visit) (Signed)
   07/22/2023  Name: Darlene Serrano MRN: [id number] DOB: 11/17/1934  Today's TOC FU Call Status: Today's TOC FU Call Status:: Unsuccessful Call (1st Attempt) Unsuccessful Call (1st Attempt) Date: 07/18/23  Attempted to reach the patient regarding the most recent Inpatient/ED visit.  Follow Up Plan: Additional outreach attempts will be made to reach the patient to complete the Transitions of Care (Post Inpatient/ED visit) call.   Signature  Jadynn Epping, LPN [facility] AWV Team Direct Dial: [phone]

## 2023-08-01 DIAGNOSIS — S0031XA Abrasion of nose, initial encounter: Secondary | ICD-10-CM | POA: Diagnosis not present

## 2023-08-01 DIAGNOSIS — S01112A Laceration without foreign body of left eyelid and periocular area, initial encounter: Secondary | ICD-10-CM | POA: Diagnosis not present

## 2023-08-01 DIAGNOSIS — E785 Hyperlipidemia, unspecified: Secondary | ICD-10-CM | POA: Diagnosis not present

## 2023-08-01 DIAGNOSIS — I5032 Chronic diastolic (congestive) heart failure: Secondary | ICD-10-CM | POA: Diagnosis not present

## 2023-08-01 DIAGNOSIS — S0001XA Abrasion of scalp, initial encounter: Secondary | ICD-10-CM | POA: Diagnosis not present

## 2023-08-01 DIAGNOSIS — Z23 Encounter for immunization: Secondary | ICD-10-CM | POA: Diagnosis not present

## 2023-08-01 DIAGNOSIS — S0081XA Abrasion of other part of head, initial encounter: Secondary | ICD-10-CM | POA: Diagnosis not present

## 2023-08-01 DIAGNOSIS — S0181XA Laceration without foreign body of other part of head, initial encounter: Secondary | ICD-10-CM | POA: Diagnosis not present

## 2023-08-01 DIAGNOSIS — S0990XA Unspecified injury of head, initial encounter: Secondary | ICD-10-CM | POA: Diagnosis not present

## 2023-08-01 DIAGNOSIS — S00212A Abrasion of left eyelid and periocular area, initial encounter: Secondary | ICD-10-CM | POA: Diagnosis not present

## 2023-08-01 DIAGNOSIS — Z880 Allergy status to penicillin: Secondary | ICD-10-CM | POA: Diagnosis not present

## 2023-08-01 DIAGNOSIS — I340 Nonrheumatic mitral (valve) insufficiency: Secondary | ICD-10-CM | POA: Diagnosis not present

## 2023-08-01 DIAGNOSIS — I5030 Unspecified diastolic (congestive) heart failure: Secondary | ICD-10-CM | POA: Diagnosis not present

## 2023-08-01 DIAGNOSIS — Z043 Encounter for examination and observation following other accident: Secondary | ICD-10-CM | POA: Diagnosis not present

## 2023-08-02 DIAGNOSIS — R9431 Abnormal electrocardiogram [ECG] [EKG]: Secondary | ICD-10-CM | POA: Diagnosis not present

## 2023-08-04 ENCOUNTER — Telehealth: Payer: Self-pay

## 2023-08-04 NOTE — Transitions of Care (Post Inpatient/ED Visit) (Signed)
   08/04/2023  Name: Darlene Serrano MRN: [id number] DOB: 1935/02/19  Today's TOC FU Call Status: Today's TOC FU Call Status:: Successful TOC FU Call Completed TOC FU Call Complete Date: 08/04/23 Patient's Name and Date of Birth confirmed.  Transition Care Management Follow-up Telephone Call Date of Discharge: 08/01/23 Discharge Facility: Other [facility]) Name of Other (Non[facility]) Discharge Facility: [facility] Type of Discharge: Emergency Department Reason for ED Visit: Other: (head injury) How have you been since you were released from the hospital?: Better Any questions or concerns?: No  Items Reviewed: Did you receive and understand the discharge instructions provided?: Yes Medications obtained,verified, and reconciled?: Yes (Medications Reviewed) Any new allergies since your discharge?: No Dietary orders reviewed?: Yes Do you have support at home?: No  Medications Reviewed Today: Medications Reviewed Today     Reviewed by Karena Addison, LPN (Licensed Practical Nurse) on 08/04/23 at 1300  Med List Status: <None>   Medication Order Taking? Sig Documenting Provider Last Dose Status Informant  apixaban (ELIQUIS) 2.5 MG TABS tablet [id number] No Take by mouth 2 (two) times daily. [provider] Taking Active   atorvastatin (LIPITOR) 10 MG tablet [id number] No Take 1 tablet (10 mg total) by mouth every morning. Bosie Clos, MD Taking Active   calcium carbonate (OS-CAL) 600 MG TABS tablet [id number] No Take 600 mg by mouth daily.  [provider] Taking Active            Med Note Drema Balzarine, EMILY M   Wed Jan 25, 2015  4:20 PM) Received from: [employer]  denosumab (PROLIA) 60 MG/ML SOSY injection [id number] No Inject 60 mg into the skin every 6 (six) months. Simmons-Robinson, Makiera, MD Taking Active   doxycycline (VIBRA-TABS) 100 MG tablet [id number]  Take 1 tablet (100 mg total) by mouth 2 (two) times daily. Debera Lat,  PA-C  Active   Fexofenadine HCl (ALLEGRA PO) [id number] No Take by mouth. [provider] Taking Active   flecainide (TAMBOCOR) 50 MG tablet [id number] No Take 50 mg by mouth 3 (three) times daily. [provider] Taking Active   furosemide (LASIX) 20 MG tablet [id number] No TAKE ONE TABLET EVERY OTHER DAY [provider] Taking Active   Multiple Vitamins-Minerals (ICAPS PO) [id number] No Take 1 tablet by mouth 2 (two) times daily. [provider] Taking Active Self  ondansetron (ZOFRAN-ODT) 4 MG disintegrating tablet [id number]  Take 1 tablet (4 mg total) by mouth every 8 (eight) hours as needed for nausea or vomiting. Minna Antis, MD  Active             Home Care and Equipment/Supplies: Were Home Health Services Ordered?: NA Any new equipment or medical supplies ordered?: NA  Functional Questionnaire: Do you need assistance with bathing/showering or dressing?: No Do you need assistance with meal preparation?: No Do you need assistance with eating?: No Do you have difficulty maintaining continence: No Do you need assistance with getting out of bed/getting out of a chair/moving?: No Do you have difficulty managing or taking your medications?: No  Follow up appointments reviewed: PCP Follow-up appointment confirmed?: No (declined appt) MD Provider Line Number:[phone] Given: No Specialist Hospital Follow-up appointment confirmed?: NA Do you need transportation to your follow-up appointment?: No Do you understand care options if your condition(s) worsen?: Yes-patient verbalized understanding    SIGNATURE Karena Addison, LPN [facility] Nurse Health Advisor Direct Dial [phone]

## 2023-08-14 ENCOUNTER — Ambulatory Visit: Admitting: Family Medicine

## 2023-08-14 ENCOUNTER — Encounter: Payer: Self-pay | Admitting: Family Medicine

## 2023-08-14 VITALS — BP 127/44 | HR 61 | Ht 65.0 in | Wt 117.0 lb

## 2023-08-14 DIAGNOSIS — W19XXXD Unspecified fall, subsequent encounter: Secondary | ICD-10-CM

## 2023-08-14 DIAGNOSIS — Z9181 History of falling: Secondary | ICD-10-CM

## 2023-08-14 NOTE — Progress Notes (Unsigned)
   Established Patient Office Visit  Subjective   Patient ID: Darlene Serrano, female    DOB: Oct 17, 1934  Age: 89 y.o. MRN: [id number]  Chief Complaint  Patient presents with  . Hospitalization Follow-up    HPI  {History (Optional):23778}    08/14/2023    1:33 PM 11/11/2022    1:45 PM 06/19/2020    9:56 AM  Depression screen PHQ 2/9  Decreased Interest 0 0 0  Down, Depressed, Hopeless 0 0 0  PHQ - 2 Score 0 0 0  Altered sleeping 0  0  Tired, decreased energy 0  0  Change in appetite 0  0  Feeling bad or failure about yourself  0  0  Trouble concentrating 0  0  Moving slowly or fidgety/restless 0  0  Suicidal thoughts 0  0  PHQ-9 Score 0  0  Difficult doing work/chores Not difficult at all  Not difficult at all       08/14/2023    1:33 PM  GAD 7 : Generalized Anxiety Score  Nervous, Anxious, on Edge 0  Control/stop worrying 1  Worry too much - different things 1  Trouble relaxing 0  Restless 0  Easily annoyed or irritable 0  Afraid - awful might happen 0  Total GAD 7 Score 2  Anxiety Difficulty Not difficult at all     ROS  Negative unless indicated in HPI   Objective:     BP (!) 127/44   Pulse 61   Ht 5\' 5"  (1.651 m)   Wt 117 lb (53.1 kg)   SpO2 100%   BMI 19.47 kg/m  {Vitals History (Optional):23777}  Physical Exam   No results found for any visits on 08/14/23.  {Labs (Optional):23779}  The ASCVD Risk score (Arnett DK, et al., 2019) failed to calculate for the following reasons:   The 2019 ASCVD risk score is only valid for ages 45 to 63    Assessment & Plan:  There are no diagnoses linked to this encounter.    No follow-ups on file.    Sallee Provencal, FNP

## 2023-08-15 ENCOUNTER — Encounter: Payer: Self-pay | Admitting: Family Medicine

## 2023-09-19 DIAGNOSIS — I5032 Chronic diastolic (congestive) heart failure: Secondary | ICD-10-CM | POA: Diagnosis not present

## 2023-09-19 DIAGNOSIS — R0781 Pleurodynia: Secondary | ICD-10-CM | POA: Diagnosis not present

## 2023-09-19 DIAGNOSIS — S20212A Contusion of left front wall of thorax, initial encounter: Secondary | ICD-10-CM | POA: Diagnosis not present

## 2023-10-15 DIAGNOSIS — H04123 Dry eye syndrome of bilateral lacrimal glands: Secondary | ICD-10-CM | POA: Diagnosis not present

## 2023-10-15 DIAGNOSIS — H353211 Exudative age-related macular degeneration, right eye, with active choroidal neovascularization: Secondary | ICD-10-CM | POA: Diagnosis not present

## 2023-11-19 DIAGNOSIS — Z85828 Personal history of other malignant neoplasm of skin: Secondary | ICD-10-CM | POA: Diagnosis not present

## 2023-11-19 DIAGNOSIS — D1801 Hemangioma of skin and subcutaneous tissue: Secondary | ICD-10-CM | POA: Diagnosis not present

## 2023-11-19 DIAGNOSIS — L570 Actinic keratosis: Secondary | ICD-10-CM | POA: Diagnosis not present

## 2023-11-19 DIAGNOSIS — L821 Other seborrheic keratosis: Secondary | ICD-10-CM | POA: Diagnosis not present

## 2023-11-19 DIAGNOSIS — L814 Other melanin hyperpigmentation: Secondary | ICD-10-CM | POA: Diagnosis not present

## 2023-12-09 ENCOUNTER — Ambulatory Visit
Admission: EM | Admit: 2023-12-09 | Discharge: 2023-12-09 | Disposition: A | Discharge status: Home / Self Care | Attending: Emergency Medicine | Admitting: Emergency Medicine

## 2023-12-09 ENCOUNTER — Encounter: Payer: Self-pay | Admitting: Emergency Medicine

## 2023-12-09 DIAGNOSIS — R350 Frequency of micturition: Secondary | ICD-10-CM | POA: Insufficient documentation

## 2023-12-09 LAB — POCT URINALYSIS DIP (MANUAL ENTRY)
Bilirubin, UA: NEGATIVE
Glucose, UA: NEGATIVE mg/dL
Ketones, POC UA: NEGATIVE mg/dL
Nitrite, UA: NEGATIVE
Protein Ur, POC: NEGATIVE mg/dL
Spec Grav, UA: 1.01 (ref 1.010–1.025)
Urobilinogen, UA: 0.2 U/dL
pH, UA: 6 (ref 5.0–8.0)

## 2023-12-09 MED ORDER — CIPROFLOXACIN HCL 500 MG PO TABS: 500.0000 mg | ORAL_TABLET | Freq: Two times a day (BID) | ORAL | 0 refills | Status: AC

## 2023-12-09 NOTE — ED Provider Notes (Signed)
 [facility]    CSN: [id number] Arrival date & time: 12/09/23  1552      History   Chief Complaint Chief Complaint  Patient presents with   Urinary Frequency    HPI Darlene Serrano is a 89 y.o. female.   Presents for evaluation of urinary frequency, lower abdominal pressure and intermittent confusion beginning 3 days ago.  Been experiencing subjective fever and general malaise.  Home test kit was completed by a staff member at her nursing facility and it showed signs of infection, daughter unsure of specifics.  Denies back pain, hematuria or vaginal symptoms.  Has not attempted treatment.   Past Medical History:  Diagnosis Date   Acid reflux 04/13/2009   Arthritis    RIGHT HIP AND HANDS;  DDD   Asthma 08/30/2010   Cancer (HCC)    LOCAL EXCISION SKIN CA FROM NOSE  06/11/13   History of palpitations    YEARS AGO   Impaired hearing    Macular degeneration of both eyes    HX OF INJECTIONS INTO LEFT EYE ( ? AVASTIN  ) FROM OCT 2012 TO OCT 2014.   Osteoporosis    Pure hypercholesterolemia    Rapid palpitations 09/30/2012   Unspecified vitamin D deficiency     Patient Active Problem List   Diagnosis Date Noted   Allergic rhinitis with postnasal drip 06/13/2022   Congestion of upper airway 06/13/2022   Hearing impairment 05/24/2022   Laryngitis 05/15/2022   Elevated blood pressure reading 05/15/2022   Congestion of right ear 05/10/2022   Age-related osteoporosis with current pathological fracture 03/15/2022   Chronic diastolic CHF (congestive heart failure), NYHA class 2 (HCC) 12/24/2021   Bilateral leg edema 12/24/2021   Bradycardia 06/06/2021   Paroxysmal atrial fibrillation (HCC) 09/25/2017   History of total hip replacement, right 08/29/2017   MR (mitral regurgitation) 02/13/2016   Cough 06/12/2015   Constipation 01/25/2015   Sinusitis, acute 01/25/2015   Malaise and fatigue 10/29/2013   Allergic rhinitis 07/08/2013   Status post right hip replacement  07/06/2013   Postoperative anemia due to acute blood loss 07/01/2013   OA (osteoarthritis) of hip 06/29/2013   Osteoarthritis (arthritis due to wear and tear of joints) 04/13/2013   Hypercholesterolemia 08/30/2010   Asthma 08/30/2010   Macular degeneration 08/30/2010   Auditory vertigo 04/13/2009   Arthritis, degenerative 04/13/2009    Past Surgical History:  Procedure Laterality Date   1987 TMJ SURGERY     1996 LEFT KNEE ARTHROSCOPY     ABDOMINAL HYSTERECTOMY  1971   APPENDECTOMY  1964   BACK SURGERY  1993   LUMBAR DISCECTOMY   MOHS SURGERY FOREHEAD FOR BASAL CELL CA  2014   TOTAL HIP ARTHROPLASTY Right 06/30/2013   Procedure: RIGHT TOTAL HIP ARTHROPLASTY ANTERIOR APPROACH;  Surgeon: Dempsey LULLA Moan, MD;  Location: [facility];  Service: Orthopedics;  Laterality: Right;    OB History     Gravida  3   Para  3   Term  1   Preterm  2   AB      Living  4      SAB      IAB      Ectopic      Multiple  1   Live Births               Home Medications    Prior to Admission medications   Medication Sig Start Date End Date Taking? Authorizing Provider  ciprofloxacin  (CIPRO ) 500  MG tablet Take 1 tablet (500 mg total) by mouth 2 (two) times daily for 3 days. 12/09/23 12/12/23 Yes Cylis Ayars, Shelba SAUNDERS, NP  amiodarone (PACERONE) 200 MG tablet Take 200 mg by mouth daily.    [provider]  apixaban (ELIQUIS) 2.5 MG TABS tablet Take by mouth 2 (two) times daily.    [provider]  atorvastatin  (LIPITOR) 10 MG tablet Take 1 tablet (10 mg total) by mouth every morning. 01/11/16   Bertrum Charlie CROME, MD  calcium  carbonate (OS-CAL) 600 MG TABS tablet Take 600 mg by mouth daily.  04/13/09   [provider]  denosumab  (PROLIA ) 60 MG/ML SOSY injection Inject 60 mg into the skin every 6 (six) months. Patient not taking: Reported on 08/14/2023 03/15/22   Simmons-Robinson, Rockie, MD  doxycycline  (VIBRA -TABS) 100 MG tablet Take 1 tablet (100 mg total) by mouth  2 (two) times daily. Patient not taking: Reported on 08/14/2023 01/22/23   Ostwalt, Janna, PA-C  Fexofenadine HCl (ALLEGRA PO) Take by mouth.    [provider]  flecainide (TAMBOCOR) 50 MG tablet Take 50 mg by mouth 3 (three) times daily. Patient not taking: Reported on 08/14/2023    [provider]  furosemide (LASIX) 20 MG tablet TAKE ONE TABLET EVERY OTHER DAY Patient not taking: Reported on 08/14/2023 12/16/18   [provider]  metoprolol tartrate (LOPRESSOR) 25 MG tablet Take 25 mg by mouth 2 (two) times daily. 03/17/23   [provider]  Multiple Vitamins-Minerals (ICAPS PO) Take 1 tablet by mouth 2 (two) times daily.    [provider]  ondansetron  (ZOFRAN -ODT) 4 MG disintegrating tablet Take 1 tablet (4 mg total) by mouth every 8 (eight) hours as needed for nausea or vomiting. Patient not taking: Reported on 08/14/2023 07/18/23   Dorothyann Drivers, MD    Family History Family History  Problem Relation Age of Onset   Congestive Heart Failure Mother    AAA (abdominal aortic aneurysm) Father    Cancer Father    Breast cancer Daughter     Social History Social History   Tobacco Use   Smoking status: Never   Smokeless tobacco: Never  Vaping Use   Vaping status: Never Used  Substance Use Topics   Alcohol use: No   Drug use: No     Allergies   Penicillins, Codeine, and Fluticasone propionate   Review of Systems Review of Systems  Genitourinary:  Positive for frequency.     Physical Exam Triage Vital Signs ED Triage Vitals  Encounter Vitals Group     BP 12/09/23 1619 120/76     Girls Systolic BP Percentile --      Girls Diastolic BP Percentile --      Boys Systolic BP Percentile --      Boys Diastolic BP Percentile --      Pulse Rate 12/09/23 1619 60     Resp 12/09/23 1619 18     Temp 12/09/23 1619 98.1 F (36.7 C)     Temp Source 12/09/23 1619 Oral     SpO2 12/09/23 1619 97 %     Weight --      Height --       Head Circumference --      Peak Flow --      Pain Score 12/09/23 1617 0     Pain Loc --      Pain Education --      Exclude from Growth Chart --    No data found.  Updated Vital Signs BP 120/76 (BP Location: Right Arm)   Pulse 60   Temp 98.1 F (36.7 C) (Oral)   Resp 18   SpO2 97%   Visual Acuity Right Eye Distance:   Left Eye Distance:   Bilateral Distance:    Right Eye Near:   Left Eye Near:    Bilateral Near:     Physical Exam Constitutional:      Appearance: Normal appearance.  Eyes:     Extraocular Movements: Extraocular movements intact.  Pulmonary:     Effort: Pulmonary effort is normal.  Abdominal:     Tenderness: There is abdominal tenderness in the suprapubic area. There is no right CVA tenderness or left CVA tenderness.  Genitourinary:    Comments: deferred Neurological:     Mental Status: She is alert and oriented to person, place, and time. Mental status is at baseline.      UC Treatments / Results  Labs (all labs ordered are listed, but only abnormal results are displayed) Labs Reviewed  POCT URINALYSIS DIP (MANUAL ENTRY) - Abnormal; Notable for the following components:      Result Value   Clarity, UA cloudy (*)    Blood, UA small (*)    Leukocytes, UA Small (1+) (*)    All other components within normal limits  URINE CULTURE    EKG   Radiology No results found.  Procedures Procedures (including critical care time)  Medications Ordered in UC Medications - No data to display  Initial Impression / Assessment and Plan / UC Course  I have reviewed the triage vital signs and the nursing notes.  Pertinent labs & imaging results that were available during my care of the patient were reviewed by me and considered in my medical decision making (see chart for details).  Urinary frequency  Urinalysis shows Larcenia Holaday blood cells negative for nitrates, prescribed ciprofloxacin  empirically she is symptomatic, recommended over-the-counter  medications and nonpharmacological supportive care with follow-up as needed Final Clinical Impressions(s) / UC Diagnoses   Final diagnoses:  Urinary frequency     Discharge Instructions      Your urinalysis shows Jerrett Baldinger blood cells, at this time does not show bacteria, your urine will be sent to the lab to determine exactly which bacteria is present, if any changes need to be made to your medications you will be notified  Begin use of Ciprofloxacin  twice daily for 3 days   You may use over-the-counter Azo to help minimize your symptoms until antibiotic removes bacteria, this medication will turn your urine orange  Increase your fluid intake through use of water  As always practice good hygiene, wiping front to back and avoidance of scented vaginal products to prevent further irritation  If symptoms continue to persist after use of medication or recur please follow-up with urgent care or your primary doctor as needed    ED Prescriptions     Medication Sig Dispense Auth. Provider   ciprofloxacin  (CIPRO ) 500 MG tablet Take 1 tablet (500 mg total) by mouth 2 (two) times daily for 3 days. 6 tablet Loyola Santino R, NP      PDMP not reviewed this encounter.   Teresa Shelba SAUNDERS, NP 12/09/23 1649

## 2023-12-09 NOTE — Discharge Instructions (Signed)
 Your urinalysis shows Darlene Serrano blood cells, at this time does not show bacteria, your urine will be sent to the lab to determine exactly which bacteria is present, if any changes need to be made to your medications you will be notified  Begin use of Ciprofloxacin  twice daily for 3 days   You may use over-the-counter Azo to help minimize your symptoms until antibiotic removes bacteria, this medication will turn your urine orange  Increase your fluid intake through use of water  As always practice good hygiene, wiping front to back and avoidance of scented vaginal products to prevent further irritation  If symptoms continue to persist after use of medication or recur please follow-up with urgent care or your primary doctor as needed

## 2023-12-09 NOTE — ED Triage Notes (Signed)
 Patients daughter reports patient has urinary frequency and brain fog x 4 days.

## 2023-12-11 LAB — URINE CULTURE: Culture: 20000 — AB

## 2023-12-12 ENCOUNTER — Ambulatory Visit (HOSPITAL_COMMUNITY): Payer: Self-pay

## 2023-12-12 MED ORDER — SULFAMETHOXAZOLE-TRIMETHOPRIM 800-160 MG PO TABS: 1.0000 | ORAL_TABLET | Freq: Two times a day (BID) | ORAL | 0 refills | Status: AC

## 2023-12-18 ENCOUNTER — Encounter: Payer: Self-pay | Admitting: Nurse Practitioner

## 2023-12-18 ENCOUNTER — Ambulatory Visit: Admitting: Nurse Practitioner

## 2023-12-18 VITALS — BP 138/76 | HR 59 | Temp 97.6°F | Ht 65.0 in | Wt 110.4 lb

## 2023-12-18 DIAGNOSIS — N3000 Acute cystitis without hematuria: Secondary | ICD-10-CM

## 2023-12-18 NOTE — Progress Notes (Signed)
 Careteam: Patient Care Team: Sharma Coyer, MD as PCP - General (Family Medicine) PLACE OF SERVICE:  [facility]   Advanced Directive information    Allergies  Allergen Reactions   Penicillins Swelling   Codeine Itching   Fluticasone Propionate Other (See Comments)    Made the patient's throat raw     Chief Complaint  Patient presents with   Follow-up    Urgent Care Follow up     HPI: Patient is a 89 y.o. female seen in today for follow up urgent care Discussed the use of AI scribe software for clinical note transcription with the patient, who gave verbal consent to proceed.  History of Present Illness Darlene Serrano is an 89 year old female who presents for follow-up after treatment for a urinary tract infection.  She was seen at urgent care on December 09, 2023, where she was diagnosed with a urinary tract infection. She was treated with antibiotics and reports feeling much better after the treatment.  Prior to treatment, she experienced symptoms of confusion, frequency, and brain fog. The initial antibiotic was changed after a culture indicated it was not appropriate for the bacteria present. She completed the last dose of the new antibiotic yesterday.  Currently, she denies any symptoms such as burning or frequency when urinating. She also reports no issues with constipation or diarrhea.   Review of Systems:  Review of Systems  Constitutional:  Negative for chills, fever and weight loss.  HENT:  Negative for tinnitus.   Respiratory:  Negative for cough, sputum production and shortness of breath.   Cardiovascular:  Negative for chest pain, palpitations and leg swelling.  Gastrointestinal:  Negative for abdominal pain, constipation, diarrhea and heartburn.  Genitourinary:  Negative for dysuria, frequency and urgency.  Skin: Negative.   Psychiatric/Behavioral:  Positive for memory loss.     Past Medical History:  Diagnosis Date   Acid reflux  04/13/2009   Arthritis    RIGHT HIP AND HANDS;  DDD   Asthma 08/30/2010   Cancer (HCC)    LOCAL EXCISION SKIN CA FROM NOSE  06/11/13   History of palpitations    YEARS AGO   Impaired hearing    Macular degeneration of both eyes    HX OF INJECTIONS INTO LEFT EYE ( ? AVASTIN  ) FROM OCT 2012 TO OCT 2014.   Osteoporosis    Pure hypercholesterolemia    Rapid palpitations 09/30/2012   Unspecified vitamin D deficiency    Past Surgical History:  Procedure Laterality Date   1987 TMJ SURGERY     1996 LEFT KNEE ARTHROSCOPY     ABDOMINAL HYSTERECTOMY  1971   APPENDECTOMY  1964   BACK SURGERY  1993   LUMBAR DISCECTOMY   MOHS SURGERY FOREHEAD FOR BASAL CELL CA  2014   TOTAL HIP ARTHROPLASTY Right 06/30/2013   Procedure: RIGHT TOTAL HIP ARTHROPLASTY ANTERIOR APPROACH;  Surgeon: Dempsey LULLA Moan, MD;  Location: [facility];  Service: Orthopedics;  Laterality: Right;   Social History:   reports that she has never smoked. She has never used smokeless tobacco. She reports that she does not drink alcohol and does not use drugs.  Family History  Problem Relation Age of Onset   Congestive Heart Failure Mother    AAA (abdominal aortic aneurysm) Father    Cancer Father    Breast cancer Daughter     Medications: Patient's Medications  New Prescriptions   No medications on file  Previous Medications  AMIODARONE (PACERONE) 200 MG TABLET    Take 200 mg by mouth daily.   APIXABAN (ELIQUIS) 2.5 MG TABS TABLET    Take by mouth 2 (two) times daily.   ATORVASTATIN  (LIPITOR) 10 MG TABLET    Take 1 tablet (10 mg total) by mouth every morning.   CALCIUM  CARBONATE (OS-CAL) 600 MG TABS TABLET    Take 600 mg by mouth daily.    DENOSUMAB  (PROLIA ) 60 MG/ML SOSY INJECTION    Inject 60 mg into the skin every 6 (six) months.   DOXYCYCLINE  (VIBRA -TABS) 100 MG TABLET    Take 1 tablet (100 mg total) by mouth 2 (two) times daily.   FEXOFENADINE HCL (ALLEGRA PO)    Take by mouth.   FLECAINIDE (TAMBOCOR) 50 MG TABLET     Take 50 mg by mouth 3 (three) times daily.   FUROSEMIDE (LASIX) 20 MG TABLET    TAKE ONE TABLET EVERY OTHER DAY   METOPROLOL TARTRATE (LOPRESSOR) 25 MG TABLET    Take 25 mg by mouth 2 (two) times daily.   MULTIPLE VITAMINS-MINERALS (ICAPS PO)    Take 1 tablet by mouth 2 (two) times daily.   ONDANSETRON  (ZOFRAN -ODT) 4 MG DISINTEGRATING TABLET    Take 1 tablet (4 mg total) by mouth every 8 (eight) hours as needed for nausea or vomiting.  Modified Medications   No medications on file  Discontinued Medications   No medications on file    Physical Exam:  Vitals:   12/18/23 1011  BP: 138/76  Pulse: (!) 59  Temp: 97.6 F (36.4 C)  SpO2: 98%  Weight: 110 lb 6.4 oz (50.1 kg)  Height: 5' 5 (1.651 m)   Body mass index is 18.37 kg/m. Wt Readings from Last 3 Encounters:  12/18/23 110 lb 6.4 oz (50.1 kg)  08/14/23 117 lb (53.1 kg)  07/18/23 126 lb 12.2 oz (57.5 kg)    Physical Exam Constitutional:      Appearance: Normal appearance.  Cardiovascular:     Rate and Rhythm: Normal rate.  Pulmonary:     Effort: Pulmonary effort is normal.     Breath sounds: Normal breath sounds.  Neurological:     Mental Status: She is alert. Mental status is at baseline.  Psychiatric:        Mood and Affect: Mood normal.     Labs reviewed: Basic Metabolic Panel: Recent Labs    07/18/23 1133  NA 140  K 4.4  CL 105  CO2 24  GLUCOSE 123*  BUN 26*  CREATININE 0.83  CALCIUM  8.9   Liver Function Tests: Recent Labs    07/18/23 1425  AST 27  ALT 22  ALKPHOS 55  BILITOT 1.0  PROT 6.5  ALBUMIN 3.9   Recent Labs    07/18/23 1425  LIPASE 29   No results for input(s): AMMONIA in the last 8760 hours. CBC: Recent Labs    07/18/23 1133  WBC 8.6  HGB 14.8  HCT 45.2  MCV 97.2  PLT 206   Lipid Panel: No results for input(s): CHOL, HDL, LDLCALC, TRIG, CHOLHDL, LDLDIRECT in the last 8760 hours. TSH: No results for input(s): TSH in the last 8760 hours. A1C: No  results found for: HGBA1C   Assessment/Plan Assessment and Plan Assessment & Plan Urinary Tract Infection (UTI) Urine culture showed colony count below infection threshold. Symptoms improved with antibiotics. Discussed risks of unnecessary antibiotic use and advised monitoring symptoms. - Monitor for dysuria, frequency, or pain. - Avoid retesting unless symptoms  recur.  Myrene Bougher K. Caro BODILY  [facility] [phone]

## 2024-01-15 DIAGNOSIS — H26493 Other secondary cataract, bilateral: Secondary | ICD-10-CM | POA: Diagnosis not present

## 2024-01-15 DIAGNOSIS — Z961 Presence of intraocular lens: Secondary | ICD-10-CM | POA: Diagnosis not present

## 2024-01-15 DIAGNOSIS — H353211 Exudative age-related macular degeneration, right eye, with active choroidal neovascularization: Secondary | ICD-10-CM | POA: Diagnosis not present

## 2024-01-19 DIAGNOSIS — I4729 Other ventricular tachycardia: Secondary | ICD-10-CM | POA: Diagnosis not present

## 2024-01-19 DIAGNOSIS — Z7689 Persons encountering health services in other specified circumstances: Secondary | ICD-10-CM | POA: Diagnosis not present

## 2024-01-19 DIAGNOSIS — I480 Paroxysmal atrial fibrillation: Secondary | ICD-10-CM | POA: Diagnosis not present

## 2024-01-19 DIAGNOSIS — E782 Mixed hyperlipidemia: Secondary | ICD-10-CM | POA: Diagnosis not present

## 2024-01-19 DIAGNOSIS — I493 Ventricular premature depolarization: Secondary | ICD-10-CM | POA: Diagnosis not present

## 2024-03-09 DIAGNOSIS — H903 Sensorineural hearing loss, bilateral: Secondary | ICD-10-CM | POA: Diagnosis not present

## 2024-03-09 DIAGNOSIS — H6123 Impacted cerumen, bilateral: Secondary | ICD-10-CM | POA: Diagnosis not present

## 2024-03-22 ENCOUNTER — Other Ambulatory Visit: Payer: Self-pay

## 2024-03-22 ENCOUNTER — Emergency Department
Admission: EM | Admit: 2024-03-22 | Discharge: 2024-03-23 | Disposition: A | Discharge status: Home / Self Care | Attending: Emergency Medicine | Admitting: Emergency Medicine

## 2024-03-22 DIAGNOSIS — S79911A Unspecified injury of right hip, initial encounter: Secondary | ICD-10-CM | POA: Diagnosis present

## 2024-03-22 DIAGNOSIS — Z7901 Long term (current) use of anticoagulants: Secondary | ICD-10-CM | POA: Diagnosis not present

## 2024-03-22 DIAGNOSIS — W19XXXA Unspecified fall, initial encounter: Secondary | ICD-10-CM

## 2024-03-22 DIAGNOSIS — W07XXXA Fall from chair, initial encounter: Secondary | ICD-10-CM | POA: Diagnosis not present

## 2024-03-22 DIAGNOSIS — I10 Essential (primary) hypertension: Secondary | ICD-10-CM | POA: Diagnosis not present

## 2024-03-22 NOTE — ED Triage Notes (Signed)
 Pt arrived from [facility] independent living via [facility] d/t hip pain that has gotten progressively worse from a fall 2-3days ago. EMS reports pt was ambulatory when they arrived. Pt reports that they slipped out of a chair. Pt reports pain on right hip.

## 2024-03-23 ENCOUNTER — Emergency Department

## 2024-03-23 MED ORDER — ACETAMINOPHEN 325 MG PO TABS
650.0000 mg | ORAL_TABLET | Freq: Once | ORAL | Status: AC
  Administered 2024-03-23: 650 mg via ORAL
  Filled 2024-03-23: qty 2

## 2024-03-23 MED ORDER — LIDOCAINE 5 % EX PTCH: 1.0000 | MEDICATED_PATCH | CUTANEOUS | 0 refills | Status: AC

## 2024-03-23 MED ORDER — IBUPROFEN 600 MG PO TABS
600.0000 mg | ORAL_TABLET | Freq: Once | ORAL | Status: AC
  Administered 2024-03-23: 600 mg via ORAL
  Filled 2024-03-23: qty 1

## 2024-03-23 MED ORDER — LIDOCAINE 5 % EX PTCH
1.0000 | MEDICATED_PATCH | CUTANEOUS | Status: DC
  Administered 2024-03-23: 1 via TRANSDERMAL
  Filled 2024-03-23: qty 1

## 2024-03-23 NOTE — ED Provider Notes (Addendum)
 [facility] -  Provider Note    Event Date/Time   First MD Initiated Contact with Patient 03/22/24 2352     (approximate)   History   Fall   HPI  Darlene Serrano is a 89 y.o. female   Past medical history of hyperlipidemia, hypertension, right total hip in 2015, on Eliquis who presents emergency department with right hip pain after she slipped out of her chair and fell onto her buttocks a couple days ago.  She is 100% certain she did not hit her head.  She has pain in the right hip only.  She reports no other acute medical complaints.  Has been ambulating despite the pain.  External Medical Documents Reviewed: Prior hospital notes      Physical Exam   Triage Vital Signs: ED Triage Vitals  Encounter Vitals Group     BP 03/22/24 2344 (!) 163/64     Girls Systolic BP Percentile --      Girls Diastolic BP Percentile --      Boys Systolic BP Percentile --      Boys Diastolic BP Percentile --      Pulse Rate 03/22/24 2344 (!) 58     Resp 03/22/24 2344 12     Temp 03/22/24 2344 97.7 F (36.5 C)     Temp Source 03/22/24 2344 Oral     SpO2 03/22/24 2344 98 %     Weight 03/22/24 2346 118 lb 9.6 oz (53.8 kg)     Height 03/22/24 2346 5' 5 (1.651 m)     Head Circumference --      Peak Flow --      Pain Score 03/22/24 2346 5     Pain Loc --      Pain Education --      Exclude from Growth Chart --     Most recent vital signs: Vitals:   03/22/24 2344  BP: (!) 163/64  Pulse: (!) 58  Resp: 12  Temp: 97.7 F (36.5 C)  SpO2: 98%    General: Awake, no distress.  CV:  Good peripheral perfusion.  Resp:  Normal effort.  Abd:  No distention.  Other:  Hard of hearing but answering questions appropriately.  Neck supple full range of motion no midline tenderness to CT or L-spine.  No evidence of head trauma.  Moves all extremities except for the right hip due to pain.  Palpation of the right knee ankle and other bony prominences elicit no pain.  Pain  with passive motion of the right hip, no noted swelling erythema or warmth.  Palpation of the lumbar spine elicits no midline tenderness or deformity.  She does have mild tenderness to palpation along the right sacroiliac joint.  Neurovascular intact bilateral lower extremities.  Soft benign abdominal exam.   ED Results / Procedures / Treatments   Labs (all labs ordered are listed, but only abnormal results are displayed) Labs Reviewed - No data to display   RADIOLOGY I independently reviewed and interpreted x-ray of the hip and see intact hardware I also reviewed radiologist's formal read.   PROCEDURES:  Critical Care performed: No  Procedures   MEDICATIONS ORDERED IN ED: Medications  lidocaine  (LIDODERM ) 5 % 1 patch (1 patch Transdermal Patch Applied 03/23/24 0240)  acetaminophen  (TYLENOL ) tablet 650 mg (650 mg Oral Given 03/23/24 0238)  ibuprofen (ADVIL) tablet 600 mg (600 mg Oral Given 03/23/24 0238)    IMPRESSION / MDM / ASSESSMENT AND PLAN / ED COURSE  I reviewed the triage vital signs and the nursing notes.                                Patient's presentation is most consistent with acute presentation with potential threat to life or bodily function.  Differential diagnosis includes, but is not limited to, hip fracture dislocation, musculoskeletal injury, considered but less likely head injury or medical cause of fall like dysrhythmia infection syncope   MDM:   Right hip pain after fall on buttocks couple days ago, no other acute traumatic injuries reported by patient nor my exam, will focus on the right hip only with an x-ray to start.  Can do CT if negative for occult fractures.  Considered septic joint but I think less likely given this was posttraumatic injury and there is no warmth or swelling or erythema to the hip.  She has been ambulating despite the pain and was seen here ambulating in room by nurse without assistance.  CT scan unremarkable as well.   Patient in stable condition.  Since she is still ambulatory despite the pain, will give anticipatory guidance and have her follow-up with her original surgeon or our orthopedic surgeon if she has lost touch in the 10-year since her surgery, return precautions given.  Discharge.         FINAL CLINICAL IMPRESSION(S) / ED DIAGNOSES   Final diagnoses:  Fall, initial encounter  Hip injury, right, initial encounter     Rx / DC Orders   ED Discharge Orders          Ordered    lidocaine  (LIDODERM ) 5 %  Every 24 hours        03/23/24 0234             Note:  This document was prepared using Dragon voice recognition software and may include unintentional dictation errors.    Cyrena Mylar, MD 03/23/24 9792    Cyrena Mylar, MD 03/23/24 [id number]

## 2024-03-23 NOTE — ED Notes (Signed)
 Pt assisted to bathroom by this tech. Fall risk bundle in place.

## 2024-03-23 NOTE — ED Notes (Signed)
 Pt returned from X-ray.

## 2024-03-23 NOTE — ED Notes (Signed)
 Per pt request, this RN attempted to call pt's daughter, Richerd, but call went straight to voicemail. No voicemail message left.

## 2024-03-23 NOTE — ED Notes (Signed)
 Pt ambulated to toilet in room and back to stretcher with minimal assistance.

## 2024-03-23 NOTE — Discharge Instructions (Addendum)
 Fortunately your evaluation in the emergency department did not show any broken bones or dislocations related to your pelvic/hip injury from a couple days ago.  Take acetaminophen  650 mg and ibuprofen 400 mg every 6 hours for pain.  Take with food.   Dr. Dempsey Moan was the one who did your hip replacement years ago.  Call him for an appointment for reassessment of your injury.  If you are unable to reconnect with him, you may call Dr. Norleen Maltos who is also an orthopedist who can help further evaluate and manage your injury.  Thank you for choosing us  for your health care today!  Please see your primary doctor this week for a follow up appointment.   If you have any new, worsening, or unexpected symptoms call your doctor right away or come back to the emergency department for reevaluation.  It was my pleasure to care for you today.   Ginnie EDISON Cyrena, MD

## 2024-03-23 NOTE — ED Notes (Signed)
 Security at [facility] contacted for safe return to pt home at [address]/independent living.

## 2024-03-25 ENCOUNTER — Emergency Department

## 2024-03-25 ENCOUNTER — Other Ambulatory Visit: Payer: Self-pay

## 2024-03-25 ENCOUNTER — Emergency Department
Admission: EM | Admit: 2024-03-25 | Discharge: 2024-03-26 | Disposition: A | Discharge status: Home / Self Care | Source: Skilled Nursing Facility | Attending: Emergency Medicine | Admitting: Emergency Medicine

## 2024-03-25 DIAGNOSIS — M545 Low back pain, unspecified: Secondary | ICD-10-CM | POA: Diagnosis not present

## 2024-03-25 DIAGNOSIS — M25551 Pain in right hip: Secondary | ICD-10-CM | POA: Insufficient documentation

## 2024-03-25 DIAGNOSIS — I482 Chronic atrial fibrillation, unspecified: Secondary | ICD-10-CM | POA: Insufficient documentation

## 2024-03-25 DIAGNOSIS — Z96641 Presence of right artificial hip joint: Secondary | ICD-10-CM | POA: Diagnosis not present

## 2024-03-25 DIAGNOSIS — Z7901 Long term (current) use of anticoagulants: Secondary | ICD-10-CM | POA: Diagnosis not present

## 2024-03-25 DIAGNOSIS — J45909 Unspecified asthma, uncomplicated: Secondary | ICD-10-CM | POA: Diagnosis not present

## 2024-03-25 DIAGNOSIS — Y92129 Unspecified place in nursing home as the place of occurrence of the external cause: Secondary | ICD-10-CM | POA: Diagnosis not present

## 2024-03-25 DIAGNOSIS — W19XXXA Unspecified fall, initial encounter: Secondary | ICD-10-CM | POA: Insufficient documentation

## 2024-03-25 DIAGNOSIS — R799 Abnormal finding of blood chemistry, unspecified: Secondary | ICD-10-CM | POA: Insufficient documentation

## 2024-03-25 DIAGNOSIS — Z85828 Personal history of other malignant neoplasm of skin: Secondary | ICD-10-CM | POA: Insufficient documentation

## 2024-03-25 DIAGNOSIS — S0990XA Unspecified injury of head, initial encounter: Secondary | ICD-10-CM | POA: Insufficient documentation

## 2024-03-25 LAB — COMPREHENSIVE METABOLIC PANEL WITH GFR
ALT: 29 U/L (ref 0–44)
AST: 33 U/L (ref 15–41)
Albumin: 3.6 g/dL (ref 3.5–5.0)
Alkaline Phosphatase: 71 U/L (ref 38–126)
Anion gap: 12 (ref 5–15)
BUN: 18 mg/dL (ref 8–23)
CO2: 25 mmol/L (ref 22–32)
Calcium: 8.6 mg/dL — ABNORMAL LOW (ref 8.9–10.3)
Chloride: 99 mmol/L (ref 98–111)
Creatinine, Ser: 0.95 mg/dL (ref 0.44–1.00)
GFR, Estimated: 58 mL/min — ABNORMAL LOW (ref >60)
Glucose, Bld: 101 mg/dL — ABNORMAL HIGH (ref 70–99)
Potassium: 4 mmol/L (ref 3.5–5.1)
Sodium: 136 mmol/L (ref 135–145)
Total Bilirubin: 1.6 mg/dL — ABNORMAL HIGH (ref 0.0–1.2)
Total Protein: 6.9 g/dL (ref 6.5–8.1)

## 2024-03-25 LAB — CBC
HCT: 34.3 % — ABNORMAL LOW (ref 36.0–46.0)
Hemoglobin: 11 g/dL — ABNORMAL LOW (ref 12.0–15.0)
MCH: 31.5 pg (ref 26.0–34.0)
MCHC: 32.1 g/dL (ref 30.0–36.0)
MCV: 98.3 fL (ref 80.0–100.0)
Platelets: 178 K/uL (ref 150–400)
RBC: 3.49 MIL/uL — ABNORMAL LOW (ref 3.87–5.11)
RDW: 13.8 % (ref 11.5–15.5)
WBC: 4.6 K/uL (ref 4.0–10.5)
nRBC: 0 % (ref 0.0–0.2)

## 2024-03-25 LAB — URINALYSIS, ROUTINE W REFLEX MICROSCOPIC
Bacteria, UA: NONE SEEN
Bilirubin Urine: NEGATIVE
Glucose, UA: NEGATIVE mg/dL
Ketones, ur: NEGATIVE mg/dL
Nitrite: NEGATIVE
Protein, ur: NEGATIVE mg/dL
Specific Gravity, Urine: 1.006 (ref 1.005–1.030)
Squamous Epithelial / HPF: 0 /HPF (ref 0–5)
pH: 6 (ref 5.0–8.0)

## 2024-03-25 MED ORDER — APIXABAN 2.5 MG PO TABS
2.5000 mg | ORAL_TABLET | Freq: Two times a day (BID) | ORAL | Status: DC
  Administered 2024-03-25 – 2024-03-26 (×2): 2.5 mg via ORAL
  Filled 2024-03-25 (×2): qty 1

## 2024-03-25 MED ORDER — LORAZEPAM 0.5 MG PO TABS: 0.5000 mg | ORAL_TABLET | Freq: Once | ORAL | Status: DC | PRN

## 2024-03-25 MED ORDER — ACETAMINOPHEN 325 MG PO TABS: 650.0000 mg | ORAL_TABLET | ORAL | Status: DC | PRN

## 2024-03-25 MED ORDER — METOPROLOL TARTRATE 25 MG PO TABS
25.0000 mg | ORAL_TABLET | Freq: Two times a day (BID) | ORAL | Status: DC
  Administered 2024-03-25 – 2024-03-26 (×2): 25 mg via ORAL
  Filled 2024-03-25 (×2): qty 1

## 2024-03-25 MED ORDER — AMIODARONE HCL 200 MG PO TABS: 200.0000 mg | ORAL_TABLET | Freq: Every day | ORAL | Status: DC

## 2024-03-25 MED ORDER — ATORVASTATIN CALCIUM 20 MG PO TABS
10.0000 mg | ORAL_TABLET | Freq: Every morning | ORAL | Status: DC
  Administered 2024-03-26: 10 mg via ORAL
  Filled 2024-03-25: qty 1

## 2024-03-25 NOTE — ED Notes (Signed)
 Family at bedside.

## 2024-03-25 NOTE — ED Notes (Signed)
 Patient's daughter states the EMS report states that the patient loss consciousness this morning while on the toilet and pulled off the towel bar. Patient and daughter states she has walked since the fall today. Patient is alert and oriented x2.

## 2024-03-25 NOTE — ED Triage Notes (Signed)
 Patient to ED from [facility]; reports she had another fall last night getting off of toilet with possible LOC. Patient continuing to have pain from fall 4-5 days ago.

## 2024-03-25 NOTE — ED Notes (Signed)
 Josette Daring PT was contacted via text and wrote back that patient will be a priority tomorrow. Dr. Viviann is aware.

## 2024-03-25 NOTE — ED Provider Notes (Signed)
-----------------------------------------   9:59 PM on 03/25/2024 -----------------------------------------  MRI is negative for acute findings.  The patient is an ED boarder awaiting TOC evaluation for disposition.  IMPRESSION:  1. No evidence of left hip fracture or avascular necrosis.  2. Moderate degenerative changes in the left hip.  3. Mild left-sided peritrochanteric tendinosis without bursitis.  4. Findings compatible with proximal rectus femoris strain or partial tear,  with the tendon intact.  5. Artifact related to a total right hip arthroplasty, without complicating  features identified.     Jacolyn Pae, MD 03/25/24 2159

## 2024-03-25 NOTE — ED Notes (Signed)
 Patient's dinner tray is at bedside. Patient's monitoring leads and blood pressure cuff and pulse ox were taken off per MRI. Patient's daughter is taking off her bra and has her watch.

## 2024-03-25 NOTE — ED Notes (Signed)
 Patient was assisted with two-person assist to the hallway bathroom. Patient was able to weight bear, but c/o pain.

## 2024-03-25 NOTE — ED Provider Notes (Addendum)
 [facility] Provider Note    Event Date/Time   First MD Initiated Contact with Patient 03/25/24 1141     (approximate)   History   Chief Complaint: Fall   HPI  Darlene Serrano is a 89 y.o. female with a history of arthritis, asthma atrial fibrillation on amiodarone and Eliquis who comes to the ED due to fall last night.  Reports that she has been in her usual state of health, feeling fine, got up in the night to go use the bathroom.  On try to walk back to her bed she fell, remembers falling, does not think she got lightheaded or passed out.  Denies any headache or neck pain, does not think she hit her head but not sure.  Denies palpitations chest pain or shortness of breath.  Does complain of pain in the right lower back which has been ongoing since a previous fall 5 days ago.  Denies dysuria        Past Medical History:  Diagnosis Date   Acid reflux 04/13/2009   Arthritis    RIGHT HIP AND HANDS;  DDD   Asthma 08/30/2010   Cancer (HCC)    LOCAL EXCISION SKIN CA FROM NOSE  06/11/13   History of palpitations    YEARS AGO   Impaired hearing    Macular degeneration of both eyes    HX OF INJECTIONS INTO LEFT EYE ( ? AVASTIN  ) FROM OCT 2012 TO OCT 2014.   Osteoporosis    Pure hypercholesterolemia    Rapid palpitations 09/30/2012   Unspecified vitamin D deficiency     Current Outpatient Rx   Order #: [id number] Class: Historical Med   Order #: [id number] Class: Historical Med   Order #: [id number] Class: Normal   Order #: [id number] Class: Historical Med   Order #: [id number] Class: Normal   Order #: [id number] Class: Normal   Order #: [id number] Class: Historical Med   Order #: [id number] Class: Historical Med   Order #: [id number] Class: Historical Med   Order #: [id number] Class: Normal   Order #: [id number] Class: Historical Med   Order #: [id number] Class: Historical Med   Order #: [id number] Class: Normal    Past Surgical History:  Procedure Laterality Date    1987 TMJ SURGERY     1996 LEFT KNEE ARTHROSCOPY     ABDOMINAL HYSTERECTOMY  1971   APPENDECTOMY  1964   BACK SURGERY  1993   LUMBAR DISCECTOMY   MOHS SURGERY FOREHEAD FOR BASAL CELL CA  2014   TOTAL HIP ARTHROPLASTY Right 06/30/2013   Procedure: RIGHT TOTAL HIP ARTHROPLASTY ANTERIOR APPROACH;  Surgeon: Dempsey LULLA Moan, MD;  Location: [facility];  Service: Orthopedics;  Laterality: Right;    Physical Exam   Triage Vital Signs: ED Triage Vitals [03/25/24 1108]  Encounter Vitals Group     BP (!) 155/110     Girls Systolic BP Percentile      Girls Diastolic BP Percentile      Boys Systolic BP Percentile      Boys Diastolic BP Percentile      Pulse Rate 62     Resp 18     Temp (!) 97.2 F (36.2 C)     Temp Source Oral     SpO2 100 %     Weight      Height      Head Circumference      Peak Flow      Pain Score 10     Pain Loc  Pain Education      Exclude from Growth Chart     Most recent vital signs: Vitals:   03/25/24 1630 03/25/24 1645  BP:    Pulse: 63 66  Resp:    Temp:    SpO2: 100% 98%    General: Awake, no distress.  CV:  Good peripheral perfusion.  Regular rate rhythm Resp:  Normal effort.  Clear lungs Abd:  No distention.  Soft nontender Other:  Head atraumatic.  No midline spinal tenderness.  There is tenderness in the right lower back musculature over the SI joint and posterior right iliac wing.  Head atraumatic.   ED Results / Procedures / Treatments   Labs (all labs ordered are listed, but only abnormal results are displayed) Labs Reviewed  COMPREHENSIVE METABOLIC PANEL WITH GFR - Abnormal; Notable for the following components:      Result Value   Glucose, Bld 101 (*)    Calcium  8.6 (*)    Total Bilirubin 1.6 (*)    GFR, Estimated 58 (*)    All other components within normal limits  CBC - Abnormal; Notable for the following components:   RBC 3.49 (*)    Hemoglobin 11.0 (*)    HCT 34.3 (*)    All other components within normal limits   URINALYSIS, ROUTINE W REFLEX MICROSCOPIC - Abnormal; Notable for the following components:   Color, Urine YELLOW (*)    APPearance CLEAR (*)    Hgb urine dipstick SMALL (*)    Leukocytes,Ua TRACE (*)    All other components within normal limits  CBG MONITORING, ED     EKG Interpreted by me Sinus rhythm rate of 62.  Normal axis intervals QRS ST segments T waves   RADIOLOGY CT head interpreted by me, unremarkable.  Radiology report reviewed.   PROCEDURES:  Procedures   MEDICATIONS ORDERED IN ED: Medications  acetaminophen  (TYLENOL ) tablet 650 mg (has no administration in time range)  atorvastatin  (LIPITOR) tablet 10 mg (has no administration in time range)  apixaban (ELIQUIS) tablet 2.5 mg (has no administration in time range)  metoprolol tartrate (LOPRESSOR) tablet 25 mg (has no administration in time range)  amiodarone (PACERONE) tablet 200 mg (has no administration in time range)  acetaminophen  (TYLENOL ) tablet 650 mg (has no administration in time range)  LORazepam (ATIVAN) tablet 0.5 mg (has no administration in time range)     IMPRESSION / MDM / ASSESSMENT AND PLAN / ED COURSE  I reviewed the triage vital signs and the nursing notes.  DDx: Intracranial hemorrhage, C-spine fracture, pelvis fracture, anemia, electrolyte derangement  Patient's presentation is most consistent with acute presentation with potential threat to life or bodily function.  Patient presents with fall, likely mechanical.  No worrisome prodromal symptoms or other acute complaints.  She does have some pain and tenderness in her right low back, will obtain CT imaging of the pelvis.  Lumbar spine is nontender.  Will need CT head and cervical spine as well.   Clinical Course as of 03/25/24 1832  Thu Mar 25, 2024  1354 CTs are negative.  Per TOC, patient will need PT evaluation here at the ED for them to provide updated [place] 2 as requested by [facility]. [PS]    Clinical Course User Index [PS]  Viviann Pastor, MD    ----------------------------------------- 6:30 PM on 03/25/2024 ----------------------------------------- PT not able to evaluate patient today, plan to see patient first thing tomorrow.  Patient and daughter informed, due to lack of safe discharge  plan, she will stay in the ED tonight.  Home meds ordered.  Will MRI pelvis to rule out occult fracture.   FINAL CLINICAL IMPRESSION(S) / ED DIAGNOSES   Final diagnoses:  Fall in home, initial encounter  Right hip pain  Chronic atrial fibrillation (HCC)     Rx / DC Orders   ED Discharge Orders     None        Note:  This document was prepared using Dragon voice recognition software and may include unintentional dictation errors.   Viviann Pastor, MD 03/25/24 1830    Viviann Pastor, MD 03/25/24 [phone]

## 2024-03-26 MED ORDER — OLANZAPINE 5 MG PO TABS
5.0000 mg | ORAL_TABLET | Freq: Once | ORAL | Status: DC
  Filled 2024-03-26: qty 1

## 2024-03-26 NOTE — ED Notes (Signed)
 Report given to [facility], family to transport pt

## 2024-03-26 NOTE — ED Notes (Signed)
 Assisted pt to toilet via w/c

## 2024-03-26 NOTE — ED Notes (Addendum)
 Pt assisted to bathroom in wheelchair with RN x2. Pt stated I think you all kidnapped me, I don't trust that I am where you say I am. RN orientated pt to time, situation and place. Pt then asked for pocket book and glasses. Pt informed that her daughter Victoria took them home with her when she left earlier this evening. Pt became tearful and stated I want my daughter to take me home, we manage just fine I don't need any help. RN informed pt that she can look at her watch for the time. Pt stated I do not trust that you all didn't change the time. Pt given eye mask to cover her eyes so that she can get some rest while in the hallway.

## 2024-03-26 NOTE — ED Notes (Signed)
 Pt c/o right hip pain 5/10. States that she does not want any pain medication at this time.

## 2024-03-26 NOTE — ED Provider Notes (Signed)
-----------------------------------------   12:25 PM on 03/26/2024 ----------------------------------------- Social work has been working with the patient they were able to arrange care at [facility].  We will discharge from the emergency department to her new skilled facility.   Dorothyann Drivers, MD 03/26/24 1225

## 2024-03-26 NOTE — Evaluation (Signed)
 Occupational Therapy Evaluation Patient Details Name: Darlene Serrano MRN: [id number] DOB: 1934/12/08 Today's Date: 03/26/2024   History of Present Illness   Patient is a 89 year old female with fall, no evidence of hip fracture. Findings compatible with proximal rectus femoris strain or partial tear,  with the tendon intact. PMH: arthritis, asthma, atrial fibrillation     Clinical Impressions Pt was seen for OT evaluation and co-tx with PT this date. Prior to hospital admission, pt was generally independent, living in [facility]. No family present to help verify home set up and PLOF. Pt exiting bathroom with RN in transport chair upon OT/PT arrival. Pt endorses being irritated becoming more agitated with questions despite active listening and reassurance and education provided in role of acute therapy and reason for visiting the pt. Formal MMT and ADL assessment limited as pt declines, requesting to return to bed to eat her breakfast. Pt required CGA and unsteady when standing and taking a couple steps to the stretcher, MIN A for return to bed. Despite education in upright positioning for safety with eating/drinking, pt declined to sit up more. Pt presents with deficits in activity tolerance, R hip pain after fall, balance, safety, awareness of deficits, and cognition, affecting safe and optimal ADL completion. Anticipate pt will require increased assist for ADL and mobility to maximize safety. Pt would benefit from skilled OT services to address noted impairments and functional limitations (see below for any additional details) in order to maximize safety and independence while minimizing future risk of falls, injury, and readmission. Anticipate the need for follow up OT services upon acute hospital DC.     If plan is discharge home, recommend the following:   A little help with walking and/or transfers;A little help with bathing/dressing/bathroom;Direct supervision/assist for medications  management;Supervision due to cognitive status;Direct supervision/assist for financial management;Assist for transportation;Assistance with cooking/housework;Help with stairs or ramp for entrance     Functional Status Assessment   Patient has had a recent decline in their functional status and demonstrates the ability to make significant improvements in function in a reasonable and predictable amount of time.     Equipment Recommendations   None recommended by OT;Other (comment) (defer, unclear what pt has at baseline, may benefit from shower chair and RW if she doesn't already have them)     Recommendations for Other Services         Precautions/Restrictions   Precautions Precautions: Fall Recall of Precautions/Restrictions: Impaired Restrictions Weight Bearing Restrictions Per Provider Order: No     Mobility Bed Mobility Overal bed mobility: Needs Assistance Bed Mobility: Sit to Supine       Sit to supine: Min assist   General bed mobility comments: assistance for RLE support to get back on the stretcher    Transfers Overall transfer level: Needs assistance Equipment used: None Transfers: Sit to/from Stand Sit to Stand: Contact guard assist                  Balance Overall balance assessment: Needs assistance Sitting-balance support: Feet supported, No upper extremity supported Sitting balance-Leahy Scale: Good     Standing balance support: No upper extremity supported Standing balance-Leahy Scale: Poor Standing balance comment: unsteady, reaches out to brace herself when standing                           ADL either performed or assessed with clinical judgement   ADL Overall ADL's : Needs assistance/impaired  Toilet Transfer Details (indicate cue type and reason): assist from nursing prior to therapy arrival         Functional mobility during ADLs: Contact guard assist       Vision          Perception         Praxis         Pertinent Vitals/Pain Pain Assessment Pain Assessment: No/denies pain (she reports no pain but then reports her pain on the right side can be excruciating. she declined need for pain medication) Pain Location: right side Pain Descriptors / Indicators: Guarding Pain Intervention(s): Limited activity within patient's tolerance, Monitored during session, Repositioned     Extremity/Trunk Assessment Upper Extremity Assessment Upper Extremity Assessment:  (unable to formally MMT due to patient cooperation, pt appeared to demo mild difficulty pushing up from the transport chair to stand)   Lower Extremity Assessment Lower Extremity Assessment: Defer to PT evaluation       Communication Communication Communication: Impaired Factors Affecting Communication: Hearing impaired   Cognition Arousal: Alert Behavior During Therapy: Agitated, Anxious Cognition: No family/caregiver present to determine baseline             OT - Cognition Comments: Pt appears irritated with questions, evades answering most questions, unable to obtain orientation questions                 Following commands: Intact       Cueing  General Comments   Cueing Techniques: Verbal cues      Exercises     Shoulder Instructions      Home Living Family/patient expects to be discharged to:: Private residence [facility] independent living) Living Arrangements: Alone                                      Prior Functioning/Environment Prior Level of Function : Independent/Modified Independent             Mobility Comments: patient reports she is active, running, and participation with group exercise at [facility]. No family present to confirm      OT Problem List: Decreased cognition;Decreased safety awareness;Impaired balance (sitting and/or standing);Decreased activity tolerance;Decreased strength;Pain   OT  Treatment/Interventions: Self-care/ADL training;Therapeutic exercise;Therapeutic activities;Cognitive remediation/compensation;Balance training;Patient/family education;DME and/or AE instruction;Energy conservation      OT Goals(Current goals can be found in the care plan section)   Acute Rehab OT Goals Patient Stated Goal: go home OT Goal Formulation: With patient Time For Goal Achievement: 04/09/24 Potential to Achieve Goals: Good ADL Goals Pt Will Perform Lower Body Dressing: with modified independence;sit to/from stand Pt Will Transfer to Toilet: with modified independence;ambulating (LRAD) Pt Will Perform Toileting - Clothing Manipulation and hygiene: with modified independence Additional ADL Goal #1: Pt will follow 2 step commands during ADL/mobility to maximize safety.   OT Frequency:  Min 2X/week    Co-evaluation PT/OT/SLP Co-Evaluation/Treatment: Yes Reason for Co-Treatment: For patient/therapist safety (limited activity tolerance, pain) PT goals addressed during session: Mobility/safety with mobility OT goals addressed during session: ADL's and self-care      AM-PAC OT 6 Clicks Daily Activity     Outcome Measure Help from another person eating meals?: None Help from another person taking care of personal grooming?: A Little Help from another person toileting, which includes using toliet, bedpan, or urinal?: A Little Help from another person bathing (including washing, rinsing, drying)?: A Little Help from another  person to put on and taking off regular upper body clothing?: A Little Help from another person to put on and taking off regular lower body clothing?: A Little 6 Click Score: 19   End of Session Nurse Communication: Mobility status;Other (comment) (recs to MD, RN)  Activity Tolerance: Treatment limited secondary to agitation Patient left: in bed;with call bell/phone within reach;with nursing/sitter in room  OT Visit Diagnosis: Unsteadiness on feet  (R26.81);History of falling (Z91.81);Pain Pain - Right/Left: Right Pain - part of body: Hip                Time: 9251-9196 OT Time Calculation (min): 15 min Charges:  OT General Charges $OT Visit: 1 Visit OT Evaluation $OT Eval Low Complexity: 1 Low  Warren SAUNDERS., MPH, MS, OTR/L ascom [phone] 03/26/24, 8:33 AM

## 2024-03-26 NOTE — ED Provider Notes (Signed)
 Emergency Medicine Observation Re-evaluation Note   BP (!) 133/59   Pulse 69   Temp 97.9 F (36.6 C) (Oral)   Resp 15   SpO2 98%    ED Course / MDM   No reported events during my shift at the time of this note.   Pt is awaiting dispo from consultants   Ginnie Shams MD    Shams Ginnie, MD 03/26/24 [phone]

## 2024-03-26 NOTE — ED Notes (Signed)
 Pt assisted to restroom via w/c.

## 2024-03-26 NOTE — ED Notes (Signed)
 Assisted RN Rachael, take pt to the bathroom and back in bed at this time.

## 2024-03-26 NOTE — Evaluation (Signed)
 Physical Therapy Evaluation Patient Details Name: Darlene Serrano MRN: [id number] DOB: 1934-06-10 Today's Date: 03/26/2024  History of Present Illness  Patient is a 89 year old female with fall, no evidence of hip fracture. Findings compatible with proximal rectus femoris strain or partial tear,  with the tendon intact. PMH: arthritis, asthma, atrial fibrillation  Clinical Impression  Patient seen for PT evaluation. At baseline she is independent with mobility, active, and goes to the gym. She lives in a villa in the [place] of [place]. Daughter confirms that patient will not have 24/7 support from family at discharge.  Today the patient reports no pain during assessment but that pain in the right side can be excruciating at times. She required assistance for bed mobility. She was unsteady with standing and may benefit from assistive device for standing and ambulation for safety. She is not at her baseline level of functional independence. Recommend rehabilitation < 3 hours/day at [facility] before returning to her [place]. PT will continue to follow while in the hospital.       If plan is discharge home, recommend the following: A little help with walking and/or transfers;A little help with bathing/dressing/bathroom;Assist for transportation;Help with stairs or ramp for entrance;Assistance with cooking/housework   Can travel by private vehicle   No    Equipment Recommendations None recommended by PT  Recommendations for Other Services       Functional Status Assessment Patient has had a recent decline in their functional status and demonstrates the ability to make significant improvements in function in a reasonable and predictable amount of time.     Precautions / Restrictions Precautions Precautions: Fall Recall of Precautions/Restrictions: Impaired Restrictions Weight Bearing Restrictions Per Provider Order: No      Mobility  Bed Mobility Overal bed mobility:  Needs Assistance Bed Mobility: Sit to Supine, Supine to Sit       Sit to supine: Min assist   General bed mobility comments: assistance for RLE support to get back on the stretcher    Transfers Overall transfer level: Needs assistance Equipment used: None Transfers: Sit to/from Stand Sit to Stand: Contact guard assist                Ambulation/Gait Ambulation/Gait assistance: Contact guard assist Gait Distance (Feet): 3 Feet Assistive device: None         General Gait Details: patient able to take a few steps from wheelchair to the stretcher. patient fearful of pain  Stairs            Wheelchair Mobility     Tilt Bed    Modified Rankin (Stroke Patients Only)       Balance Overall balance assessment: Needs assistance Sitting-balance support: Feet supported Sitting balance-Leahy Scale: Good     Standing balance support: No upper extremity supported Standing balance-Leahy Scale: Poor Standing balance comment: unsteady, could benefit from assistive device                             Pertinent Vitals/Pain Pain Assessment Pain Assessment: No/denies pain (she reports no pain but then reports her pain on the right side can be excruciating. she declined need for pain medication) Pain Location: right side Pain Descriptors / Indicators: Guarding Pain Intervention(s): Limited activity within patient's tolerance, Monitored during session, Repositioned    Home Living Family/patient expects to be discharged to:: Private residence [facility] independent living) Living Arrangements: Alone  Prior Function Prior Level of Function : Independent/Modified Independent             Mobility Comments: patient reports she is active, running, and participation with group exercise at [facility]. No family present to confirm       Extremity/Trunk Assessment   Upper Extremity Assessment Upper Extremity Assessment:   (unable to formally MMT due to patient cooperation, pt appeared to demo mild difficulty pushing up from the transport chair to stand)    Lower Extremity Assessment Lower Extremity Assessment: Defer to PT evaluation       Communication   Communication Communication: Impaired Factors Affecting Communication: Hearing impaired    Cognition Arousal: Alert Behavior During Therapy: Agitated, Anxious   PT - Cognitive impairments: No family/caregiver present to determine baseline, Safety/Judgement                       PT - Cognition Comments: decreased awareness of deficits and need for physical assistance. she is easily agitated with asking questions about prior level of function Following commands: Intact       Cueing Cueing Techniques: Verbal cues     General Comments General comments (skin integrity, edema, etc.): spoke with daugther at the bedside. she confirms that patient will not have 24/7 assist from family at the [facility] and is interested in SNF and [facility]. She is also agreeable to transport her to the facility    Exercises     Assessment/Plan    PT Assessment Patient needs continued PT services  PT Problem List Decreased strength;Decreased range of motion;Decreased activity tolerance;Decreased balance;Decreased mobility;Decreased safety awareness;Decreased knowledge of use of DME;Decreased cognition       PT Treatment Interventions DME instruction;Gait training;Stair training;Therapeutic activities;Functional mobility training;Therapeutic exercise;Balance training;Neuromuscular re-education;Cognitive remediation;Patient/family education    PT Goals (Current goals can be found in the Care Plan section)  Acute Rehab PT Goals Patient Stated Goal: patient wants to return home, daugther wants SNF at [facility] PT Goal Formulation: With patient/family Time For Goal Achievement: 04/09/24 Potential to Achieve Goals: Fair    Frequency Min 2X/week      Co-evaluation   Reason for Co-Treatment: For patient/therapist safety (limited activity tolerance, pain) PT goals addressed during session: Mobility/safety with mobility OT goals addressed during session: ADL's and self-care       AM-PAC PT 6 Clicks Mobility  Outcome Measure Help needed turning from your back to your side while in a flat bed without using bedrails?: None Help needed moving from lying on your back to sitting on the side of a flat bed without using bedrails?: A Little Help needed moving to and from a bed to a chair (including a wheelchair)?: A Little Help needed standing up from a chair using your arms (e.g., wheelchair or bedside chair)?: A Little Help needed to walk in hospital room?: A Little Help needed climbing 3-5 steps with a railing? : A Lot 6 Click Score: 18    End of Session   Activity Tolerance: Patient tolerated treatment well Patient left: in bed;with call bell/phone within reach;with bed alarm set Nurse Communication: Mobility status PT Visit Diagnosis: Unsteadiness on feet (R26.81);Muscle weakness (generalized) (M62.81)    Time: 9251-9186 PT Time Calculation (min) (ACUTE ONLY): 25 min   Charges:   PT Evaluation $PT Eval Low Complexity: 1 Low PT Treatments $Therapeutic Activity: 8-22 mins PT General Charges $$ ACUTE PT VISIT: 1 Visit         Randine Essex, PT, MPT  Randine LULLA Essex 03/26/2024, 8:33 AM

## 2024-03-26 NOTE — ED Notes (Signed)
Pt given cup of coffee.

## 2024-03-26 NOTE — ED Notes (Signed)
 Attempted to call report to [facility], states will have to call back.

## 2024-03-26 NOTE — NC FL2 (Signed)
 [place]  MEDICAID [id number] LEVEL OF CARE FORM     IDENTIFICATION  Patient Name: Darlene Serrano Birthdate: 17-Oct-1934 Sex: female Admission Date (Current Location): 03/25/2024  [facility] and [place] Number:  Chiropodist and Address:  [facility], [address] [place], [place] [address]      Provider Number: [id number]  Attending Physician Name and Address:  Dorothyann Drivers, MD  Relative Name and Phone Number:  Acie Richerd BROTHERS (Daughter)  [phone]    Current Level of Care:   Recommended Level of Care: Skilled Nursing Facility Prior Approval Number:    Date Approved/Denied:   PASRR Number: [id number]  Discharge Plan: SNF    Current Diagnoses: Patient Active Problem List   Diagnosis Date Noted   Allergic rhinitis with postnasal drip 06/13/2022   Congestion of upper airway 06/13/2022   Hearing impairment 05/24/2022   Laryngitis 05/15/2022   Elevated blood pressure reading 05/15/2022   Congestion of right ear 05/10/2022   Age-related osteoporosis with current pathological fracture 03/15/2022   Chronic diastolic CHF (congestive heart failure), NYHA class 2 (HCC) 12/24/2021   Bilateral leg edema 12/24/2021   Bradycardia 06/06/2021   Paroxysmal atrial fibrillation (HCC) 09/25/2017   History of total hip replacement, right 08/29/2017   MR (mitral regurgitation) 02/13/2016   Cough 06/12/2015   Constipation 01/25/2015   Sinusitis, acute 01/25/2015   Malaise and fatigue 10/29/2013   Allergic rhinitis 07/08/2013   Status post right hip replacement 07/06/2013   Postoperative anemia due to acute blood loss 07/01/2013   OA (osteoarthritis) of hip 06/29/2013   Osteoarthritis (arthritis due to wear and tear of joints) 04/13/2013   Hypercholesterolemia 08/30/2010   Asthma 08/30/2010   Macular degeneration 08/30/2010   Auditory vertigo 04/13/2009   Arthritis, degenerative 04/13/2009    Orientation RESPIRATION BLADDER Height &  Weight          Continent Weight:   Height:     BEHAVIORAL SYMPTOMS/MOOD NEUROLOGICAL BOWEL NUTRITION STATUS      Continent Diet (Diet regular Room service appropriate? Yes; Fluid consistency: Thin: General starting at 10/30 1839)  AMBULATORY STATUS COMMUNICATION OF NEEDS Skin   Limited Assist   Normal                       Personal Care Assistance Level of Assistance  Bathing, Feeding, Dressing Bathing Assistance: Limited assistance Feeding assistance: Independent Dressing Assistance: Limited assistance     Functional Limitations Info  Sight, Hearing, Speech          SPECIAL CARE FACTORS FREQUENCY  PT (By licensed PT), OT (By licensed OT)     PT Frequency: 2x OT Frequency: 2X            Contractures      Additional Factors Info  Allergies, Code Status Code Status Info: Prior Allergies Info: Penicillin, Codeine, Fluticason           Current Medications (03/26/2024):  This is the current hospital active medication list Current Facility-Administered Medications  Medication Dose Route Frequency Provider Last Rate Last Admin   acetaminophen  (TYLENOL ) tablet 650 mg  650 mg Oral Q4H PRN Viviann Pastor, MD       acetaminophen  (TYLENOL ) tablet 650 mg  650 mg Oral Q4H PRN Viviann Pastor, MD       amiodarone (PACERONE) tablet 200 mg  200 mg Oral Daily Viviann Pastor, MD       apixaban WINN) tablet 2.5 mg  2.5 mg Oral BID Viviann,  Manus, MD   2.5 mg at 03/25/24 2218   atorvastatin  (LIPITOR) tablet 10 mg  10 mg Oral q morning Viviann Manus, MD       metoprolol tartrate (LOPRESSOR) tablet 25 mg  25 mg Oral BID Viviann Manus, MD   25 mg at 03/25/24 2218   OLANZapine (ZYPREXA) tablet 5 mg  5 mg Oral Once Wong, Silas, MD       Current Outpatient Medications  Medication Sig Dispense Refill   amiodarone (PACERONE) 200 MG tablet Take 200 mg by mouth daily.     apixaban (ELIQUIS) 2.5 MG TABS tablet Take by mouth 2 (two) times daily.      atorvastatin  (LIPITOR) 10 MG tablet Take 1 tablet (10 mg total) by mouth every morning. 30 tablet 12   calcium  carbonate (OS-CAL) 600 MG TABS tablet Take 600 mg by mouth daily.      denosumab  (PROLIA ) 60 MG/ML SOSY injection Inject 60 mg into the skin every 6 (six) months. (Patient not taking: Reported on 12/18/2023) 180 mL 1   doxycycline  (VIBRA -TABS) 100 MG tablet Take 1 tablet (100 mg total) by mouth 2 (two) times daily. (Patient not taking: Reported on 12/18/2023) 14 tablet 0   Fexofenadine HCl (ALLEGRA PO) Take by mouth.     flecainide (TAMBOCOR) 50 MG tablet Take 50 mg by mouth 3 (three) times daily. (Patient not taking: Reported on 12/18/2023)     furosemide (LASIX) 20 MG tablet TAKE ONE TABLET EVERY OTHER DAY (Patient not taking: Reported on 12/18/2023)     lidocaine  (LIDODERM ) 5 % Place 1 patch onto the skin daily for 10 days. Remove & Discard patch within 12 hours or as directed by MD 10 patch 0   metoprolol tartrate (LOPRESSOR) 25 MG tablet Take 25 mg by mouth 2 (two) times daily.     Multiple Vitamins-Minerals (ICAPS PO) Take 1 tablet by mouth 2 (two) times daily.     ondansetron  (ZOFRAN -ODT) 4 MG disintegrating tablet Take 1 tablet (4 mg total) by mouth every 8 (eight) hours as needed for nausea or vomiting. (Patient not taking: Reported on 12/18/2023) 20 tablet 0     Discharge Medications: Please see discharge summary for a list of discharge medications.  Relevant Imaging Results:  Relevant Lab Results:   Additional Information [id number]  Dalores Weger L Carina Chaplin, [place]

## 2024-03-29 ENCOUNTER — Encounter: Payer: Self-pay | Admitting: Orthopedic Surgery

## 2024-03-29 ENCOUNTER — Non-Acute Institutional Stay: Payer: Self-pay | Admitting: Orthopedic Surgery

## 2024-03-29 DIAGNOSIS — I5032 Chronic diastolic (congestive) heart failure: Secondary | ICD-10-CM

## 2024-03-29 DIAGNOSIS — I480 Paroxysmal atrial fibrillation: Secondary | ICD-10-CM | POA: Diagnosis not present

## 2024-03-29 DIAGNOSIS — M25551 Pain in right hip: Secondary | ICD-10-CM

## 2024-03-29 DIAGNOSIS — G8929 Other chronic pain: Secondary | ICD-10-CM

## 2024-03-29 DIAGNOSIS — M8000XD Age-related osteoporosis with current pathological fracture, unspecified site, subsequent encounter for fracture with routine healing: Secondary | ICD-10-CM

## 2024-03-29 DIAGNOSIS — R296 Repeated falls: Secondary | ICD-10-CM

## 2024-03-29 DIAGNOSIS — R413 Other amnesia: Secondary | ICD-10-CM | POA: Diagnosis not present

## 2024-03-29 DIAGNOSIS — M545 Low back pain, unspecified: Secondary | ICD-10-CM

## 2024-03-29 DIAGNOSIS — E7800 Pure hypercholesterolemia, unspecified: Secondary | ICD-10-CM

## 2024-03-29 LAB — CBC AND DIFFERENTIAL
HCT: 33 — AB (ref 36–46)
Hemoglobin: 10.7 — AB (ref 12.0–16.0)
Neutrophils Absolute: 2812
Platelets: 245 K/uL (ref 150–400)
WBC: 4.4

## 2024-03-29 LAB — BASIC METABOLIC PANEL WITH GFR
BUN: 22 — AB (ref 4–21)
CO2: 29 — AB (ref 13–22)
Chloride: 105 (ref 99–108)
Creatinine: 1 (ref 0.5–1.1)
Glucose: 68
Potassium: 4.4 meq/L (ref 3.5–5.1)
Sodium: 140 (ref 137–147)

## 2024-03-29 LAB — COMPREHENSIVE METABOLIC PANEL WITH GFR
Calcium: 8.2 — AB (ref 8.7–10.7)
eGFR: 57

## 2024-03-29 LAB — CBC: RBC: 3.32 — AB (ref 3.87–5.11)

## 2024-03-29 NOTE — Addendum Note (Signed)
 Addended byBETHA GIL NO E on: 03/29/2024 12:05 PM   Modules accepted: Orders

## 2024-03-29 NOTE — Progress Notes (Addendum)
 Location:  Other [facility].  Nursing Home Room Number: [facility] Place of Service:  SNF (31) Provider:  Greig Cluster, NP  Pcp, No  Patient Care Team: Pcp, No as PCP - General  Extended Emergency Contact Information Primary Emergency Contact: [facility] Address: [address]          [place], [place] [address] [place] Mobile Phone: [phone] Relation: Daughter Secondary Emergency Contact: Harlee Masson Address: [address] [place] [address] [place] Mobile Phone: [phone] Relation: Daughter  Code Status:  Full Code.  Goals of care: Advanced Directive information    03/25/2024   11:09 AM  Advanced Directives  Does Patient Have a Medical Advance Directive? Yes  Type of Advance Directive Living will  Does patient want to make changes to medical advance directive? No - Patient declined  Would patient like information on creating a medical advance directive? No - Guardian declined     Chief Complaint  Patient presents with   Hospitalization Follow-up    Hospital Follow up    HPI:  Pt is a 89 y.o. female seen today for f/u s/p ED visit 10/27 & 10/30.   She currently resides on the rehab unit at [facility]. PMH: CHF, HLD, bradycardia, MR, PAF, asthma, OA, osteoporosis, macular degeneration and constipation.    10/27 she presented to the ED due to right hip pain post fall. She slipped out of chair a few days prior and landed on buttocks. H/o RATH 2015. Right hip/pelvic xray negative for acute fracture ot dislocation. CT right femur negative for abnormality but degenerative changes seen to pubic symphysis and sacroiliac joints. Lab work unremarkable. She was given lidocaine  patch. Advised to follow up with orthopedics.   10/30 she fell again while walking back to bed. Denies head injury. Denied dizziness. CT cervical spine negative for acute abnormality, degenerative retrolisthesis C5-C6 noted, mild chronic  ischemic white matter changes noted. MRI pelvis noted left sided peritrochanteric tendinosis without bursitis/ moderate degenerative changes to left hip, no left hip fracture or avascular necrosis, proximal rectus femoris strain or partial tear, with the tendon intact.   10/31 she was discharged to [facility] rehab unit for PT/OT/nursing services. She admits to low back and right hip pain with ambulation. She appears very frustrated to be in rehab. She is unable to name where she is, month, year or president today. Nursing also reports confusion and frustration since admission. Ambulating with FWW to bathroom with 1+ assist. Unable to rate pain today. Afebrile. Vitals stable.    Past Medical History:  Diagnosis Date   Acid reflux 04/13/2009   Arthritis    RIGHT HIP AND HANDS;  DDD   Asthma 08/30/2010   Cancer (HCC)    LOCAL EXCISION SKIN CA FROM NOSE  06/11/13   History of palpitations    YEARS AGO   Impaired hearing    Macular degeneration of both eyes    HX OF INJECTIONS INTO LEFT EYE ( ? AVASTIN  ) FROM OCT 2012 TO OCT 2014.   Osteoporosis    Pure hypercholesterolemia    Rapid palpitations 09/30/2012   Unspecified vitamin D deficiency    Past Surgical History:  Procedure Laterality Date   1987 TMJ SURGERY     1996 LEFT KNEE ARTHROSCOPY     ABDOMINAL HYSTERECTOMY  1971   APPENDECTOMY  1964   BACK SURGERY  1993   LUMBAR DISCECTOMY   MOHS  SURGERY FOREHEAD FOR BASAL CELL CA  2014   TOTAL HIP ARTHROPLASTY Right 06/30/2013   Procedure: RIGHT TOTAL HIP ARTHROPLASTY ANTERIOR APPROACH;  Surgeon: Dempsey LULLA Moan, MD;  Location: [facility];  Service: Orthopedics;  Laterality: Right;    Allergies  Allergen Reactions   Penicillins Swelling   Codeine Itching   Fluticasone Propionate Other (See Comments)    Made the patient's throat raw     Outpatient Encounter Medications as of 03/29/2024  Medication Sig   acetaminophen  (TYLENOL ) 325 MG tablet Take 650 mg by mouth every 4 (four) hours as needed.    amiodarone (PACERONE) 200 MG tablet Take 200 mg by mouth daily.   apixaban (ELIQUIS) 2.5 MG TABS tablet Take by mouth 2 (two) times daily.   atorvastatin  (LIPITOR) 10 MG tablet Take 1 tablet (10 mg total) by mouth every morning.   calcium  carbonate (OS-CAL) 600 MG TABS tablet Take 600 mg by mouth daily.    denosumab  (PROLIA ) 60 MG/ML SOSY injection Inject 60 mg into the skin every 6 (six) months.   Fexofenadine HCl (ALLEGRA PO) Take 1 tablet by mouth daily.   furosemide (LASIX) 20 MG tablet Take 20 mg by mouth daily as needed. For weight gain of 3lbs overnight and 5lbs in a week.   lidocaine  (LIDODERM ) 5 % Place 1 patch onto the skin daily for 10 days. Remove & Discard patch within 12 hours or as directed by MD   metoprolol tartrate (LOPRESSOR) 25 MG tablet Take 25 mg by mouth 2 (two) times daily.   Multiple Vitamins-Minerals (ICAPS PO) Take 1 tablet by mouth 2 (two) times daily.   ondansetron  (ZOFRAN -ODT) 4 MG disintegrating tablet Take 1 tablet (4 mg total) by mouth every 8 (eight) hours as needed for nausea or vomiting.   doxycycline  (VIBRA -TABS) 100 MG tablet Take 1 tablet (100 mg total) by mouth 2 (two) times daily. (Patient not taking: Reported on 03/29/2024)   flecainide (TAMBOCOR) 50 MG tablet Take 50 mg by mouth 3 (three) times daily. (Patient not taking: Reported on 03/29/2024)   No facility-administered encounter medications on file as of 03/29/2024.    Review of Systems  Constitutional: Negative.   HENT: Negative.    Respiratory: Negative.    Cardiovascular: Negative.   Gastrointestinal: Negative.   Genitourinary: Negative.   Musculoskeletal:  Positive for arthralgias, back pain and gait problem.  Skin:  Negative for wound.  Neurological:  Positive for weakness. Negative for dizziness and light-headedness.  Psychiatric/Behavioral:  Positive for agitation and confusion. Negative for dysphoric mood. The patient is not nervous/anxious.     Immunization History  Administered  Date(s) Administered   Fluad Quad(high Dose 65+) 02/23/2020, 02/07/2021, 02/07/2022   INFLUENZA, HIGH DOSE SEASONAL PF 03/02/2015, 03/09/2016, 02/03/2017, 01/28/2018, 02/09/2019   Pneumococcal Conjugate-13 04/15/2014   Pneumococcal Polysaccharide-23 02/03/2017   Tdap 02/09/2011, 08/01/2023   Unspecified SARS-COV-2 Vaccination 04/05/2022, 02/21/2023, 09/05/2023   Pertinent  Health Maintenance Due  Topic Date Due   Influenza Vaccine  12/26/2023   DEXA SCAN  Completed      06/19/2020    9:56 AM 01/02/2021   11:35 AM 04/22/2022    8:15 PM 05/10/2022    9:51 AM 11/11/2022    1:42 PM  Fall Risk  Falls in the past year? 1 1  0 0  Was there an injury with Fall? 1 1  0 0  Fall Risk Category Calculator 2 3  0 0  Fall Risk Category (Retired) Moderate  High   Low    (  RETIRED) Patient Fall Risk Level  High fall risk  Low fall risk  Low fall risk    Patient at Risk for Falls Due to  Impaired balance/gait  No Fall Risks No Fall Risks  Fall risk Follow up  Falls evaluation completed    Education provided;Falls prevention discussed     Data saved with a previous flowsheet row definition   Functional Status Survey:    Vitals:   03/29/24 0937 03/29/24 0945  BP: (!) 150/66 (!) 174/77  Pulse: 60   Resp: 16   Temp: (!) 97.4 F (36.3 C)   SpO2: 97%   Weight: 110 lb 9.6 oz (50.2 kg)   Height: 5' 5 (1.651 m)    Body mass index is 18.4 kg/m. Physical Exam Vitals reviewed.  Constitutional:      General: She is not in acute distress. HENT:     Head: Normocephalic and atraumatic.     Ears:     Comments: HOH Eyes:     General:        Right eye: No discharge.        Left eye: No discharge.  Cardiovascular:     Rate and Rhythm: Normal rate and regular rhythm.     Pulses: Normal pulses.     Heart sounds: Normal heart sounds.  Pulmonary:     Effort: Pulmonary effort is normal. No respiratory distress.     Breath sounds: Normal breath sounds. No wheezing or rales.  Abdominal:      General: Bowel sounds are normal.     Palpations: Abdomen is soft.  Musculoskeletal:     Cervical back: Neck supple.     Right lower leg: No edema.     Left lower leg: No edema.     Comments: Mild kyphosis  Skin:    General: Skin is warm.     Capillary Refill: Capillary refill takes less than 2 seconds.     Coloration: Skin is not pale.  Neurological:     General: No focal deficit present.     Mental Status: She is alert.     Gait: Gait abnormal.  Psychiatric:        Mood and Affect: Mood normal.     Labs reviewed: Recent Labs    07/18/23 1133 03/25/24 1112  NA 140 136  K 4.4 4.0  CL 105 99  CO2 24 25  GLUCOSE 123* 101*  BUN 26* 18  CREATININE 0.83 0.95  CALCIUM  8.9 8.6*   Recent Labs    07/18/23 1425 03/25/24 1112  AST 27 33  ALT 22 29  ALKPHOS 55 71  BILITOT 1.0 1.6*  PROT 6.5 6.9  ALBUMIN 3.9 3.6   Recent Labs    07/18/23 1133 03/25/24 1112  WBC 8.6 4.6  HGB 14.8 11.0*  HCT 45.2 34.3*  MCV 97.2 98.3  PLT 206 178   Lab Results  Component Value Date   TSH 1.980 07/27/2021   No results found for: HGBA1C Lab Results  Component Value Date   CHOL 165 07/27/2021   HDL 71 07/27/2021   LDLCALC 76 07/27/2021   TRIG 100 07/27/2021   CHOLHDL 2.0 08/04/2017    Significant Diagnostic Results in last 30 days:  MR PELVIS WO CONTRAST Result Date: 03/25/2024 EXAM: MRI OF THE PELVIS WITHOUT INTRAVENOUS CONTRAST 03/25/2024 07:51:19 PM TECHNIQUE: Multiplanar magnetic resonance images of the pelvis without intravenous contrast. COMPARISON: None available. CLINICAL HISTORY: Hip trauma, fracture suspected, xray done. FINDINGS: SOFT TISSUES: Mild left-sided  peritrochanteric tendinosis without bursitis. Mild inflammation/edema/fluid around the proximal rectus femoris tendon extending down inferiorly along the musculotendinous junction. The tendon is intact. No evidence of rupture or retraction. Probable musculotendinous sprain / partial tear. The hamstring tendons  are intact. No inguinal adenopathy or hernia. JOINTS: The left hip is normally located. Moderate age-related degenerative changes with joint space narrowing and osteophytic spurring. No joint effusion. No erosive findings. No hip fracture or avascular necrosis (AVN). Significant artifact related to a total right hip arthroplasty. No obvious complicating features are identified. The pubic symphysis and sacroiliac (SI) joints are intact. LIMITED INTRAPELVIC CONTENTS: No significant intrapelvic abnormalities are identified. No pelvic mass or free pelvic fluid collections. BONES: No pelvic fractures or bone lesions. No acute fracture or focal osseous lesion. IMPRESSION: 1. No evidence of left hip fracture or avascular necrosis. 2. Moderate degenerative changes in the left hip. 3. Mild left-sided peritrochanteric tendinosis without bursitis. 4. Findings compatible with proximal rectus femoris strain or partial tear, with the tendon intact. 5. Artifact related to a total right hip arthroplasty, without complicating features identified. Electronically signed by: Maude Stammer MD 03/25/2024 09:07 PM EDT RP Workstation: [id number]   CT PELVIS WO CONTRAST Result Date: 03/25/2024 EXAM: CT PELVIS, WITHOUT IV CONTRAST 03/25/2024 12:44:50 PM TECHNIQUE: Axial images were acquired through the pelvis without IV contrast. Reformatted images were reviewed. Automated exposure control, iterative reconstruction, and/or weight based adjustment of the mA/kV was utilized to reduce the radiation dose to as low as reasonably achievable. COMPARISON: 03/23/2024 CLINICAL HISTORY: Pelvic fracture. FINDINGS: BONES: Status post right total hip arthroplasty. No acute fracture or focal osseous lesion. JOINTS: No dislocation. The joint spaces are normal. SOFT TISSUES: The soft tissues are unremarkable. INTRAPELVIC CONTENTS: Sigmoid diverticulosis without inflammation. Limited images of the intrapelvic contents demonstrate no acute abnormality.  IMPRESSION: 1. No acute osseous abnormality. 2. Sigmoid diverticulosis without inflammation. Electronically signed by: Lynwood Seip MD 03/25/2024 01:06 PM EDT RP Workstation: [id number]   CT HEAD WO CONTRAST ( ) Result Date: 03/25/2024 EXAM: CT HEAD AND CERVICAL SPINE 03/25/2024 12:44:50 PM TECHNIQUE: CT of the head and cervical spine was performed without the administration of intravenous contrast. Multiplanar reformatted images are provided for review. Automated exposure control, iterative reconstruction, and/or weight based adjustment of the mA/kV was utilized to reduce the radiation dose to as low as reasonably achievable. COMPARISON: Head CT 07/18/2023. CLINICAL HISTORY: FINDINGS: CT HEAD BRAIN AND VENTRICLES: No acute intracranial hemorrhage. No mass effect or midline shift. No abnormal extra-axial fluid collection. No evidence of acute infarct. No hydrocephalus. Mild chronic ischemic white matter changes. ORBITS: No acute abnormality. SINUSES AND MASTOIDS: No acute abnormality. SOFT TISSUES AND SKULL: No acute skull fracture. No acute soft tissue abnormality. CT CERVICAL SPINE BONES AND ALIGNMENT: Straightening of normal cervical lordosis. No acute fracture or traumatic malalignment. DEGENERATIVE CHANGES: Degenerative grade 1 retrolisthesis at C5-C6. SOFT TISSUES: No prevertebral soft tissue swelling. IMPRESSION: 1. No acute intracranial abnormality. 2. Mild chronic ischemic white matter changes. 3. No acute fracture or traumatic malalignment of the cervical spine. 4. Straightening of normal cervical lordosis and grade 1 degenerative retrolisthesis at C5-C6. Electronically signed by: Franky Stanford MD 03/25/2024 12:49 PM EDT RP Workstation: [id number]   CT Cervical Spine Wo Contrast Result Date: 03/25/2024 EXAM: CT HEAD AND CERVICAL SPINE 03/25/2024 12:44:50 PM TECHNIQUE: CT of the head and cervical spine was performed without the administration of intravenous contrast. Multiplanar reformatted images  are provided for review. Automated exposure control, iterative reconstruction, and/or weight based adjustment of the  mA/kV was utilized to reduce the radiation dose to as low as reasonably achievable. COMPARISON: Head CT 07/18/2023. CLINICAL HISTORY: FINDINGS: CT HEAD BRAIN AND VENTRICLES: No acute intracranial hemorrhage. No mass effect or midline shift. No abnormal extra-axial fluid collection. No evidence of acute infarct. No hydrocephalus. Mild chronic ischemic white matter changes. ORBITS: No acute abnormality. SINUSES AND MASTOIDS: No acute abnormality. SOFT TISSUES AND SKULL: No acute skull fracture. No acute soft tissue abnormality. CT CERVICAL SPINE BONES AND ALIGNMENT: Straightening of normal cervical lordosis. No acute fracture or traumatic malalignment. DEGENERATIVE CHANGES: Degenerative grade 1 retrolisthesis at C5-C6. SOFT TISSUES: No prevertebral soft tissue swelling. IMPRESSION: 1. No acute intracranial abnormality. 2. Mild chronic ischemic white matter changes. 3. No acute fracture or traumatic malalignment of the cervical spine. 4. Straightening of normal cervical lordosis and grade 1 degenerative retrolisthesis at C5-C6. Electronically signed by: Franky Stanford MD 03/25/2024 12:49 PM EDT RP Workstation: [id number]   CT FEMUR RIGHT WO CONTRAST Result Date: 03/23/2024 EXAM: CT OF THE RIGHT FEMUR, WITHOUT IV CONTRAST 03/23/2024 02:10:32 AM TECHNIQUE: Axial images were acquired through the right femur without IV contrast. Reformatted images were reviewed. Automated exposure control, iterative reconstruction, and/or weight based adjustment of the mA/kV was utilized to reduce the radiation dose to as low as reasonably achievable. COMPARISON: None available. CLINICAL HISTORY: Fall right hip pain. Patient complains of hip pain that has gotten progressively worse from a fall 2-3 days ago. EMS reports patient was ambulatory when they arrived. Patient reports that they slipped out of a chair. FINDINGS:  BONES: Osseous structures are diffusely osteopenic. Right total hip arthroplasty has been performed. No acute fracture. No periprosthetic erosions. Degenerative changes are seen at the pubic symphysis and involving the sacroiliac joints bilaterally. JOINTS: No dislocation. The joint spaces are normal. SOFT TISSUES: The soft tissues are unremarkable. IMPRESSION: 1. No acute osseous abnormality. 2. Right total hip arthroplasty. Electronically signed by: Dorethia Molt MD 03/23/2024 02:39 AM EDT RP Workstation: [id number]   DG Hip Unilat W or Wo Pelvis 2-3 Views Right Result Date: 03/23/2024 EXAM: 2 or 3 VIEW(S) XRAY OF THE RIGHT HIP 03/23/2024 12:32:03 AM COMPARISON: None available. CLINICAL HISTORY: Fall 3 days ago with right hip pain. PER ER NOTE; Pt [facility] from [facility] independent living via [facility] d/t hip pain that has gotten progressively worse from a fall 2-3days ago. EMS reports pt was ambulatory when they arrived. Pt reports that they slipped out of a chair. Pt reports pain on right hip. FINDINGS: BONES AND JOINTS: Radiopaque arthroplasty appears uncomplicated. There is heterotopic ossification laterally. The bones are diffusely osteopenic. No acute fracture. The hip joint is maintained. There are moderate degenerative changes of the pubic symphysis. SOFT TISSUES: The soft tissues are unremarkable. IMPRESSION: 1. No acute fracture or dislocation. 2. Uncomplicated right hip arthroplasty with lateral heterotopic ossification. 3. Diffuse osteopenia. Electronically signed by: Greig Pique MD 03/23/2024 12:35 AM EDT RP Workstation: [id number]    Assessment/Plan 1. Right hip pain (Primary) - mechanical fall 10/27 & 10/30 - h/o RATH 2015 per Dr. Melodi - CT/MRI pelvis/hip/spine negative for fracture/dislocation - cont PT/OT services - will increase tylenol  to 1000 mg TID PRN - start lidocaine  4% patches for low back pain  - recommend scheduling with [facility]  2. Memory changes - noted since  admission/ also frustration/agitation - unable to name place, month, year, president during our encounter - recent UA unremarkable, sodium 136 - CT noted mild chronic ischemic white matter changes> MRI brain normal 06/2023 -  ST evaluation for cognitive testing  3. Paroxysmal atrial fibrillation (HCC) - followed by cardiology - HR< 100 with amiodarone and metoprolol - TSH 2.330 09/2023 - cont Eliquis for clot prevention   4. Hypercholesterolemia - cont atorvastatin   5. Frequent falls - see above - cont PT/OT  6. Age-related osteoporosis with current pathological fracture with routine healing, subsequent encounter - 02/2022 DEXA> t score -4.7 - cont Prolia   7. Chronic midline low back pain without sciatica - c/o low back pain, no radiation - CT spine noted degenerative changes to C5-6 - cont lidocaine  patches x 10 days  8. Chronic diastolic CHF (congestive heart failure), NYHA class 2 (HCC) - appears compensated - cont furosemide prn   Family/ staff Communication: plan discussed with patient and daughter  Labs/tests ordered:  ST evaluation

## 2024-03-30 ENCOUNTER — Encounter: Payer: Self-pay | Admitting: Internal Medicine

## 2024-03-30 ENCOUNTER — Non-Acute Institutional Stay (SKILLED_NURSING_FACILITY): Payer: Self-pay | Admitting: Internal Medicine

## 2024-03-30 DIAGNOSIS — E7800 Pure hypercholesterolemia, unspecified: Secondary | ICD-10-CM

## 2024-03-30 DIAGNOSIS — I480 Paroxysmal atrial fibrillation: Secondary | ICD-10-CM

## 2024-03-30 DIAGNOSIS — I5032 Chronic diastolic (congestive) heart failure: Secondary | ICD-10-CM

## 2024-03-30 DIAGNOSIS — S76819A Strain of other specified muscles, fascia and tendons at thigh level, unspecified thigh, initial encounter: Secondary | ICD-10-CM | POA: Diagnosis not present

## 2024-03-30 DIAGNOSIS — F03B0 Unspecified dementia, moderate, without behavioral disturbance, psychotic disturbance, mood disturbance, and anxiety: Secondary | ICD-10-CM | POA: Insufficient documentation

## 2024-03-30 DIAGNOSIS — I10 Essential (primary) hypertension: Secondary | ICD-10-CM

## 2024-03-30 DIAGNOSIS — R0989 Other specified symptoms and signs involving the circulatory and respiratory systems: Secondary | ICD-10-CM

## 2024-03-30 DIAGNOSIS — F03B18 Unspecified dementia, moderate, with other behavioral disturbance: Secondary | ICD-10-CM

## 2024-03-30 DIAGNOSIS — H9113 Presbycusis, bilateral: Secondary | ICD-10-CM | POA: Diagnosis not present

## 2024-03-30 DIAGNOSIS — F02B0 Dementia in other diseases classified elsewhere, moderate, without behavioral disturbance, psychotic disturbance, mood disturbance, and anxiety: Secondary | ICD-10-CM | POA: Insufficient documentation

## 2024-03-30 NOTE — Assessment & Plan Note (Addendum)
 Remains on amiodarone 200 mg daily, eliquis 2.5 mg bid for CVA prophylaxis, lopressor 25 mg bid

## 2024-03-30 NOTE — Assessment & Plan Note (Addendum)
 Stable. Euvolemic. Not exacerbated. On prn lasix 20 mg for weight gain. Echo 02-2023 NORMAL LEFT VENTRICULAR SYSTOLIC FUNCTION WITH NO LVH  ESTIMATED EF: 55%

## 2024-03-30 NOTE — Assessment & Plan Note (Addendum)
 MRI shows strain vs partial tear. Thankfully no fracture.  Continue with PT.  Increase lidoderm  patches to 2 patches to right buttocks qday.  Start scheduled tylenol  650 mg qid. Start massage gun to right buttock to help with piriformis muscle spasm

## 2024-03-30 NOTE — Assessment & Plan Note (Addendum)
 Continue with lopressor 25 mg bid. Prn lasix for weight gain.

## 2024-03-30 NOTE — Progress Notes (Signed)
 [facility] SNF Admission H&P  Provider: Camellia Door, DO Location:   [facility].   Place of Service:   [facility]   PCP: Pcp, No Patient Care Team: Pcp, No as PCP - General   Extended Emergency Contact Information Primary Emergency Contact: [facility] Address: [address]          [place], [place] [address] [place] Mobile Phone: [phone] Relation: Daughter Secondary Emergency Contact: Harlee Masson Address: [address] [place] [address] [place] Mobile Phone: [phone] Relation: Daughter   Goals of Care: Advanced Directive information    03/25/2024   11:09 AM  Advanced Directives  Does Patient Have a Medical Advance Directive? Yes  Type of Advance Directive Living will  Does patient want to make changes to medical advance directive? No - Patient declined  Would patient like information on creating a medical advance directive? No - Guardian declined    CODE STATUS: Full Code    Chief Complaint  Patient presents with   Admission    Admission     HPI: Patient is a 89 y.o. female seen today for admission to [facility].    Patient seen at bedside with her daughter Masson present.  Patient lives alone and in a villa here at [facility]  She is followed by [facility] for her PCP but also sees cardiology at [facility] clinic.  She has a history of atrial fibrillation.  She is on amiodarone and Eliquis.  Also on metoprolol.  She has fallen twice in the last 10 days and was seen in the ER on March 22, 2024.  She was discharged to home then fell again on March 25, 2024.  She did not require admission to the hospital.  She was unable to walk.  She had an MRI of the pelvis which demonstrated a proximal rectus femoris strain or partial tear with the tendon intact.  There is no bony fractures.  She was seen by physical therapy while in the ER and was felt that she needed acute rehab.  She  was transferred to [facility] for admission on Friday, March 26, 2024.  Daughter Masson states privately that patient has dementia and that she has been going downhill for several years.  Daughter does not think that pt is going to be able to return to independent living after her rehab stay.  Patient complains of some right buttock pain but the daughter states the patient has already been walking up and down the hallways very well.  Patient's pain is intermittent.  Daughter is more concerned about where the patient is going to live after she is discharged from acute rehab.  She has not yet explored independent living or memory care here at [facility].  Patient seems to recognize her daughter but gets easily confused on when she fell.  What happened to her in the hospital.  She does remember that she did not have any acute fractures.  This is a comprehensive admission note to this SNFperformed on this date less than 30 days from date of admission.  Included are preadmission medical/surgical history; reconciled medication list; family history; social history and comprehensive review of systems.   Corrections and additions to the records were documented. Comprehensive physical exam was also performed.   Additionally a clinical summary was entered for each active diagnosis pertinent to this admission in the Problem List  to enhance continuity of care.   Past Medical History:  Diagnosis Date   Acid reflux 04/13/2009   Arthritis    RIGHT HIP AND HANDS;  DDD   Asthma 08/30/2010   Cancer (HCC)    LOCAL EXCISION SKIN CA FROM NOSE  06/11/13   History of palpitations    YEARS AGO   Impaired hearing    Macular degeneration of both eyes    HX OF INJECTIONS INTO LEFT EYE ( ? AVASTIN  ) FROM OCT 2012 TO OCT 2014.   Osteoporosis    Pure hypercholesterolemia    Rapid palpitations 09/30/2012   Status post right hip replacement 07/06/2013   Unspecified vitamin D deficiency    Past Surgical History:   Procedure Laterality Date   1987 TMJ SURGERY     1996 LEFT KNEE ARTHROSCOPY     ABDOMINAL HYSTERECTOMY  1971   APPENDECTOMY  1964   BACK SURGERY  1993   LUMBAR DISCECTOMY   MOHS SURGERY FOREHEAD FOR BASAL CELL CA  2014   TOTAL HIP ARTHROPLASTY Right 06/30/2013   Procedure: RIGHT TOTAL HIP ARTHROPLASTY ANTERIOR APPROACH;  Surgeon: Dempsey LULLA Moan, MD;  Location: [facility]S;  Service: Orthopedics;  Laterality: Right;    reports that she has never smoked. She has never used smokeless tobacco. She reports that she does not drink alcohol and does not use drugs. Social History   Socioeconomic History   Marital status: Divorced    Spouse name: Not on file   Number of children: Not on file   Years of education: Not on file   Highest education level: Not on file  Occupational History   Not on file  Tobacco Use   Smoking status: Never   Smokeless tobacco: Never  Vaping Use   Vaping status: Never Used  Substance and Sexual Activity   Alcohol use: No   Drug use: No   Sexual activity: Not on file  Other Topics Concern   Not on file  Social History Narrative   Not on file   Social Drivers of Health   Financial Resource Strain: Low Risk  (11/11/2022)   Overall Financial Resource Strain (CARDIA)    Difficulty of Paying Living Expenses: Not hard at all  Food Insecurity: No Food Insecurity (11/11/2022)   Hunger Vital Sign    Worried About Running Out of Food in the Last Year: Never true    Ran Out of Food in the Last Year: Never true  Transportation Needs: No Transportation Needs (11/11/2022)   PRAPARE - Administrator, Civil Service (Medical): No    Lack of Transportation (Non-Medical): No  Physical Activity: Sufficiently Active (11/11/2022)   Exercise Vital Sign    Days of Exercise per Week: 6 days    Minutes of Exercise per Session: 60 min  Stress: No Stress Concern Present (11/11/2022)   [employer] of Occupational Health - Occupational Stress Questionnaire    Feeling  of Stress : Not at all  Social Connections: Moderately Integrated (11/11/2022)   Social Connection and Isolation Panel    Frequency of Communication with Friends and Family: More than three times a week    Frequency of Social Gatherings with Friends and Family: Twice a week    Attends Religious Services: More than 4 times per year    Active Member of [employer] or Organizations: Yes    Attends Banker Meetings: More than 4 times per year    Marital Status: Widowed  Intimate Partner Violence:  Not At Risk (11/11/2022)   Humiliation, Afraid, Rape, and Kick questionnaire    Fear of Current or Ex-Partner: No    Emotionally Abused: No    Physically Abused: No    Sexually Abused: No     Functional Status Survey:     Family History  Problem Relation Age of Onset   Congestive Heart Failure Mother    AAA (abdominal aortic aneurysm) Father    Cancer Father    Breast cancer Daughter      Health Maintenance  Topic Date Due   Zoster Vaccines- Shingrix (1 of 2) Never done   Medicare Annual Wellness (AWV)  11/11/2023   Influenza Vaccine  12/26/2023   COVID-19 Vaccine (4 - 2025-26 season) 01/26/2024   DTaP/Tdap/Td (3 - Td or Tdap) 07/31/2033   Pneumococcal Vaccine: 50+ Years  Completed   DEXA SCAN  Completed   Meningococcal B Vaccine  Aged Out     Allergies  Allergen Reactions   Penicillins Swelling   Codeine Itching   Fluticasone Propionate Other (See Comments)    Made the patient's throat raw      Outpatient Encounter Medications as of 03/30/2024  Medication Sig   acetaminophen  (TYLENOL ) 325 MG tablet Take 650 mg by mouth 4 (four) times daily.   amiodarone (PACERONE) 200 MG tablet Take 200 mg by mouth daily.   apixaban (ELIQUIS) 2.5 MG TABS tablet Take by mouth 2 (two) times daily.   atorvastatin  (LIPITOR) 10 MG tablet Take 1 tablet (10 mg total) by mouth every morning.   calcium  carbonate (OS-CAL) 600 MG TABS tablet Take 600 mg by mouth daily.    denosumab  (PROLIA ) 60  MG/ML SOSY injection Inject 60 mg into the skin every 6 (six) months.   Fexofenadine HCl (ALLEGRA PO) Take 1 tablet by mouth daily.   furosemide (LASIX) 20 MG tablet Take 20 mg by mouth daily as needed. For weight gain of 3lbs overnight and 5lbs in a week.   lidocaine  (LIDODERM ) 5 % Place 1 patch onto the skin daily for 10 days. Remove & Discard patch within 12 hours or as directed by MD   metoprolol tartrate (LOPRESSOR) 25 MG tablet Take 25 mg by mouth 2 (two) times daily.   Multiple Vitamins-Minerals (ICAPS PO) Take 1 tablet by mouth 2 (two) times daily.   ondansetron  (ZOFRAN -ODT) 4 MG disintegrating tablet Take 1 tablet (4 mg total) by mouth every 8 (eight) hours as needed for nausea or vomiting.   No facility-administered encounter medications on file as of 03/30/2024.     Review of Systems  Musculoskeletal:        Right buttock pain making turning over in bed difficult. Dtr Beula states pt is walking well with rollator already  All other systems reviewed and are negative.    Vitals:   03/30/24 1122  BP: (!) 114/46  Pulse: 66  Resp: 14  Temp: 97.6 F (36.4 C)  SpO2: 98%  Weight: 110 lb 9.6 oz (50.2 kg)  Height: 5' 5 (1.651 m)   Body mass index is 18.4 kg/m. Physical Exam Vitals and nursing note reviewed.  Constitutional:      General: She is not in acute distress.    Appearance: She is not toxic-appearing or diaphoretic.     Comments: Thin, frail appearing  HENT:     Head: Normocephalic and atraumatic.     Nose: Nose normal.  Eyes:     General: No scleral icterus. Cardiovascular:     Rate and Rhythm:  Normal rate and regular rhythm.  Pulmonary:     Effort: Pulmonary effort is normal. No respiratory distress.     Breath sounds: Normal breath sounds.  Abdominal:     General: Abdomen is flat. Bowel sounds are normal.     Tenderness: There is abdominal tenderness.   Musculoskeletal:       Legs:  Skin:    General: Skin is warm and dry.     Capillary Refill:  Capillary refill takes less than 2 seconds.     Findings: No bruising.  Neurological:     Mental Status: She is alert. She is disoriented.      Labs reviewed: Basic Metabolic Panel: Recent Labs    07/18/23 1133 03/25/24 1112 03/29/24 0000  NA 140 136 140  K 4.4 4.0 4.4  CL 105 99 105  CO2 24 25 29*  GLUCOSE 123* 101*  --   BUN 26* 18 22*  CREATININE 0.83 0.95 1.0  CALCIUM  8.9 8.6* 8.2*   Liver Function Tests: Recent Labs    07/18/23 1425 03/25/24 1112  AST 27 33  ALT 22 29  ALKPHOS 55 71  BILITOT 1.0 1.6*  PROT 6.5 6.9  ALBUMIN 3.9 3.6   Recent Labs    07/18/23 1425  LIPASE 29    CBC: Recent Labs    07/18/23 1133 03/25/24 1112 03/29/24 0000  WBC 8.6 4.6 4.4  NEUTROABS  --   --  2,812.00  HGB 14.8 11.0* 10.7*  HCT 45.2 34.3* 33*  MCV 97.2 98.3  --   PLT 206 178 245     Imaging and Procedures obtained prior to SNF admission: MR PELVIS WO CONTRAST Result Date: 03/25/2024 EXAM: MRI OF THE PELVIS WITHOUT INTRAVENOUS CONTRAST 03/25/2024 07:51:19 PM TECHNIQUE: Multiplanar magnetic resonance images of the pelvis without intravenous contrast. COMPARISON: None available. CLINICAL HISTORY: Hip trauma, fracture suspected, xray done. FINDINGS: SOFT TISSUES: Mild left-sided peritrochanteric tendinosis without bursitis. Mild inflammation/edema/fluid around the proximal rectus femoris tendon extending down inferiorly along the musculotendinous junction. The tendon is intact. No evidence of rupture or retraction. Probable musculotendinous sprain / partial tear. The hamstring tendons are intact. No inguinal adenopathy or hernia. JOINTS: The left hip is normally located. Moderate age-related degenerative changes with joint space narrowing and osteophytic spurring. No joint effusion. No erosive findings. No hip fracture or avascular necrosis (AVN). Significant artifact related to a total right hip arthroplasty. No obvious complicating features are identified. The pubic symphysis  and sacroiliac (SI) joints are intact. LIMITED INTRAPELVIC CONTENTS: No significant intrapelvic abnormalities are identified. No pelvic mass or free pelvic fluid collections. BONES: No pelvic fractures or bone lesions. No acute fracture or focal osseous lesion. IMPRESSION: 1. No evidence of left hip fracture or avascular necrosis. 2. Moderate degenerative changes in the left hip. 3. Mild left-sided peritrochanteric tendinosis without bursitis. 4. Findings compatible with proximal rectus femoris strain or partial tear, with the tendon intact. 5. Artifact related to a total right hip arthroplasty, without complicating features identified. Electronically signed by: Maude Stammer MD 03/25/2024 09:07 PM EDT RP Workstation: [id number]   CT PELVIS WO CONTRAST Result Date: 03/25/2024 EXAM: CT PELVIS, WITHOUT IV CONTRAST 03/25/2024 12:44:50 PM TECHNIQUE: Axial images were acquired through the pelvis without IV contrast. Reformatted images were reviewed. Automated exposure control, iterative reconstruction, and/or weight based adjustment of the mA/kV was utilized to reduce the radiation dose to as low as reasonably achievable. COMPARISON: 03/23/2024 CLINICAL HISTORY: Pelvic fracture. FINDINGS: BONES: Status post right total hip arthroplasty. No  acute fracture or focal osseous lesion. JOINTS: No dislocation. The joint spaces are normal. SOFT TISSUES: The soft tissues are unremarkable. INTRAPELVIC CONTENTS: Sigmoid diverticulosis without inflammation. Limited images of the intrapelvic contents demonstrate no acute abnormality. IMPRESSION: 1. No acute osseous abnormality. 2. Sigmoid diverticulosis without inflammation. Electronically signed by: Lynwood Seip MD 03/25/2024 01:06 PM EDT RP Workstation: [id number]   CT HEAD WO CONTRAST ( ) Result Date: 03/25/2024 EXAM: CT HEAD AND CERVICAL SPINE 03/25/2024 12:44:50 PM TECHNIQUE: CT of the head and cervical spine was performed without the administration of intravenous  contrast. Multiplanar reformatted images are provided for review. Automated exposure control, iterative reconstruction, and/or weight based adjustment of the mA/kV was utilized to reduce the radiation dose to as low as reasonably achievable. COMPARISON: Head CT 07/18/2023. CLINICAL HISTORY: FINDINGS: CT HEAD BRAIN AND VENTRICLES: No acute intracranial hemorrhage. No mass effect or midline shift. No abnormal extra-axial fluid collection. No evidence of acute infarct. No hydrocephalus. Mild chronic ischemic white matter changes. ORBITS: No acute abnormality. SINUSES AND MASTOIDS: No acute abnormality. SOFT TISSUES AND SKULL: No acute skull fracture. No acute soft tissue abnormality. CT CERVICAL SPINE BONES AND ALIGNMENT: Straightening of normal cervical lordosis. No acute fracture or traumatic malalignment. DEGENERATIVE CHANGES: Degenerative grade 1 retrolisthesis at C5-C6. SOFT TISSUES: No prevertebral soft tissue swelling. IMPRESSION: 1. No acute intracranial abnormality. 2. Mild chronic ischemic white matter changes. 3. No acute fracture or traumatic malalignment of the cervical spine. 4. Straightening of normal cervical lordosis and grade 1 degenerative retrolisthesis at C5-C6. Electronically signed by: Franky Stanford MD 03/25/2024 12:49 PM EDT RP Workstation: [id number]   CT Cervical Spine Wo Contrast Result Date: 03/25/2024 EXAM: CT HEAD AND CERVICAL SPINE 03/25/2024 12:44:50 PM TECHNIQUE: CT of the head and cervical spine was performed without the administration of intravenous contrast. Multiplanar reformatted images are provided for review. Automated exposure control, iterative reconstruction, and/or weight based adjustment of the mA/kV was utilized to reduce the radiation dose to as low as reasonably achievable. COMPARISON: Head CT 07/18/2023. CLINICAL HISTORY: FINDINGS: CT HEAD BRAIN AND VENTRICLES: No acute intracranial hemorrhage. No mass effect or midline shift. No abnormal extra-axial fluid collection.  No evidence of acute infarct. No hydrocephalus. Mild chronic ischemic white matter changes. ORBITS: No acute abnormality. SINUSES AND MASTOIDS: No acute abnormality. SOFT TISSUES AND SKULL: No acute skull fracture. No acute soft tissue abnormality. CT CERVICAL SPINE BONES AND ALIGNMENT: Straightening of normal cervical lordosis. No acute fracture or traumatic malalignment. DEGENERATIVE CHANGES: Degenerative grade 1 retrolisthesis at C5-C6. SOFT TISSUES: No prevertebral soft tissue swelling. IMPRESSION: 1. No acute intracranial abnormality. 2. Mild chronic ischemic white matter changes. 3. No acute fracture or traumatic malalignment of the cervical spine. 4. Straightening of normal cervical lordosis and grade 1 degenerative retrolisthesis at C5-C6. Electronically signed by: Franky Stanford MD 03/25/2024 12:49 PM EDT RP Workstation: [id number]     Assessment/Plan Assessment & Plan Strain of rectus femoris muscle MRI shows strain vs partial tear. Thankfully no fracture.  Continue with PT.  Increase lidoderm  patches to 2 patches to right buttocks qday.  Start scheduled tylenol  650 mg qid. Start massage gun to right buttock to help with piriformis muscle spasm    Palpable abdominal aorta Pt with palpable abdominal aorta. May need screening AAA U/S when she sees cardiology next time. She has appt with cards 07-19-2024. Defer to cards to order.     Paroxysmal atrial fibrillation (HCC) Remains on amiodarone 200 mg daily, eliquis 2.5 mg bid for CVA prophylaxis, lopressor 25 mg  bid     Moderate dementia with other behavioral disturbance, unspecified dementia type [facility]) Discussed with dtr Leesa that pt may not be safe to return to independent living. Pt may need alternative living arrangements. SW involved.     Chronic diastolic CHF (congestive heart failure), NYHA class 2 (HCC) Stable. Euvolemic. Not exacerbated. On prn lasix 20 mg for weight gain. Echo 02-2023 NORMAL LEFT VENTRICULAR SYSTOLIC FUNCTION  WITH NO LVH  ESTIMATED EF: 55%      Presbycusis of both ears Dtr states she will bring pt's hearing aids.     Essential hypertension Continue with lopressor 25 mg bid. Prn lasix for weight gain.     Hypercholesterolemia Remains on lipitor 10 mg daily.          Family/ staff Communication: discussed at bedside and outside pt's room privately with dtr Beula   Labs/tests ordered: BMP in 1 week    Camellia Door, DO  [facility] [phone]

## 2024-03-30 NOTE — Assessment & Plan Note (Addendum)
 Dtr states she will bring pt's hearing aids.

## 2024-03-30 NOTE — Assessment & Plan Note (Addendum)
 Pt with palpable abdominal aorta. May need screening AAA U/S when she sees cardiology next time. She has appt with cards 07-19-2024. Defer to cards to order.

## 2024-03-30 NOTE — Assessment & Plan Note (Addendum)
 Remains on lipitor 10 mg daily.

## 2024-03-30 NOTE — Assessment & Plan Note (Addendum)
 Discussed with dtr Leesa that pt may not be safe to return to independent living. Pt may need alternative living arrangements. SW involved.

## 2024-05-03 ENCOUNTER — Encounter: Payer: Self-pay | Admitting: Orthopedic Surgery

## 2024-05-03 ENCOUNTER — Non-Acute Institutional Stay (SKILLED_NURSING_FACILITY): Payer: Self-pay | Admitting: Orthopedic Surgery

## 2024-05-03 DIAGNOSIS — R634 Abnormal weight loss: Secondary | ICD-10-CM

## 2024-05-03 DIAGNOSIS — I5032 Chronic diastolic (congestive) heart failure: Secondary | ICD-10-CM

## 2024-05-03 DIAGNOSIS — I480 Paroxysmal atrial fibrillation: Secondary | ICD-10-CM

## 2024-05-03 DIAGNOSIS — M8000XD Age-related osteoporosis with current pathological fracture, unspecified site, subsequent encounter for fracture with routine healing: Secondary | ICD-10-CM

## 2024-05-03 DIAGNOSIS — F03B18 Unspecified dementia, moderate, with other behavioral disturbance: Secondary | ICD-10-CM

## 2024-05-03 DIAGNOSIS — E7800 Pure hypercholesterolemia, unspecified: Secondary | ICD-10-CM

## 2024-05-03 DIAGNOSIS — S76819A Strain of other specified muscles, fascia and tendons at thigh level, unspecified thigh, initial encounter: Secondary | ICD-10-CM

## 2024-05-03 NOTE — Progress Notes (Unsigned)
 Location:  Other Nursing Home Room Number: [facility] 106/A Place of Service:  SNF (31) Provider:  Greig FORBES Cluster, NP   Pcp, No  Patient Care Team: Pcp, No as PCP - General  Extended Emergency Contact Information Primary Emergency Contact: [facility] Address: [address]          [place], [place] [address] [place] Mobile Phone: [phone] Relation: Daughter Secondary Emergency Contact: Harlee Masson Address: [address] [place] [address] [place] Mobile Phone: [phone] Relation: Daughter  Code Status:  Full code Goals of care: Advanced Directive information    03/25/2024   11:09 AM  Advanced Directives  Does Patient Have a Medical Advance Directive? Yes  Type of Advance Directive Living will  Does patient want to make changes to medical advance directive? No - Patient declined  Would patient like information on creating a medical advance directive? No - Guardian declined     Chief Complaint  Patient presents with   Medical Management of Chronic Issues    HPI:  Pt is a 89 y.o. female seen today for medical management of chronic diseases.    She currently resides on the rehab unit at [facility]. PMH: CHF, HLD, bradycardia, MR, PAF, asthma, OA, osteoporosis, macular degeneration and constipation.  Past Medical History:  Diagnosis Date   Acid reflux 04/13/2009   Arthritis    RIGHT HIP AND HANDS;  DDD   Asthma 08/30/2010   Cancer (HCC)    LOCAL EXCISION SKIN CA FROM NOSE  06/11/13   History of palpitations    YEARS AGO   Impaired hearing    Macular degeneration of both eyes    HX OF INJECTIONS INTO LEFT EYE ( ? AVASTIN  ) FROM OCT 2012 TO OCT 2014.   Osteoporosis    Pure hypercholesterolemia    Rapid palpitations 09/30/2012   Status post right hip replacement 07/06/2013   Unspecified vitamin D deficiency    Past Surgical History:  Procedure Laterality Date   1987 TMJ SURGERY     1996 LEFT  KNEE ARTHROSCOPY     ABDOMINAL HYSTERECTOMY  1971   APPENDECTOMY  1964   BACK SURGERY  1993   LUMBAR DISCECTOMY   MOHS SURGERY FOREHEAD FOR BASAL CELL CA  2014   TOTAL HIP ARTHROPLASTY Right 06/30/2013   Procedure: RIGHT TOTAL HIP ARTHROPLASTY ANTERIOR APPROACH;  Surgeon: Dempsey LULLA Moan, MD;  Location: [facility] ORS;  Service: Orthopedics;  Laterality: Right;    Allergies  Allergen Reactions   Penicillins Swelling   Codeine Itching   Fluticasone Propionate Other (See Comments)    Made the patient's throat raw     Outpatient Encounter Medications as of 05/03/2024  Medication Sig   acetaminophen  (TYLENOL ) 325 MG tablet Take 650 mg by mouth 4 (four) times daily.   amiodarone  (PACERONE ) 200 MG tablet Take 200 mg by mouth daily.   apixaban  (ELIQUIS ) 2.5 MG TABS tablet Take by mouth 2 (two) times daily.   atorvastatin  (LIPITOR) 10 MG tablet Take 1 tablet (10 mg total) by mouth every morning.   calcium  carbonate (OS-CAL) 600 MG TABS tablet Take 600 mg by mouth daily.    denosumab  (PROLIA ) 60 MG/ML SOSY injection Inject 60 mg into the skin every 6 (six) months.   Fexofenadine HCl (ALLEGRA PO) Take 1 tablet by mouth daily.   furosemide (LASIX) 20 MG tablet Take 20 mg by mouth daily as needed. For  weight gain of 3lbs overnight and 5lbs in a week.   metoprolol  tartrate (LOPRESSOR ) 25 MG tablet Take 25 mg by mouth 2 (two) times daily.   Multiple Vitamins-Minerals (ICAPS PO) Take 1 tablet by mouth 2 (two) times daily.   ondansetron  (ZOFRAN -ODT) 4 MG disintegrating tablet Take 1 tablet (4 mg total) by mouth every 8 (eight) hours as needed for nausea or vomiting.   No facility-administered encounter medications on file as of 05/03/2024.    Review of Systems  Immunization History  Administered Date(s) Administered   Fluad Quad(high Dose 65+) 02/23/2020, 02/07/2021, 02/07/2022   INFLUENZA, HIGH DOSE SEASONAL PF 03/02/2015, 03/09/2016, 02/03/2017, 01/28/2018, 02/09/2019   Pneumococcal Conjugate-13  04/15/2014   Pneumococcal Polysaccharide-23 02/03/2017   Tdap 02/09/2011, 08/01/2023   Unspecified SARS-COV-2 Vaccination 04/05/2022, 02/21/2023, 09/05/2023   Pertinent  Health Maintenance Due  Topic Date Due   Influenza Vaccine  12/26/2023   Bone Density Scan  Completed      01/02/2021   11:35 AM 04/22/2022    8:15 PM 05/10/2022    9:51 AM 11/11/2022    1:42 PM 03/29/2024   11:37 AM  Fall Risk  Falls in the past year? 1  0 0 1  Was there an injury with Fall? 1   0  0  1   Fall Risk Category Calculator 3  0 0 3  Fall Risk Category (Retired) High   Low     (RETIRED) Patient Fall Risk Level High fall risk  Low fall risk  Low fall risk     Patient at Risk for Falls Due to Impaired balance/gait  No Fall Risks No Fall Risks History of fall(s);Impaired balance/gait  Fall risk Follow up Falls evaluation completed    Education provided;Falls prevention discussed Falls evaluation completed     Data saved with a previous flowsheet row definition   Functional Status Survey:    Vitals:   05/03/24 1805  BP: 126/68  Pulse: 67  Resp: 18  Temp: 97.6 F (36.4 C)  SpO2: 96%  Weight: 107 lb 6.4 oz (48.7 kg)   Body mass index is 17.87 kg/m. Physical Exam  Labs reviewed: Recent Labs    07/18/23 1133 03/25/24 1112 03/29/24 0000  NA 140 136 140  K 4.4 4.0 4.4  CL 105 99 105  CO2 24 25 29*  GLUCOSE 123* 101*  --   BUN 26* 18 22*  CREATININE 0.83 0.95 1.0  CALCIUM  8.9 8.6* 8.2*   Recent Labs    07/18/23 1425 03/25/24 1112  AST 27 33  ALT 22 29  ALKPHOS 55 71  BILITOT 1.0 1.6*  PROT 6.5 6.9  ALBUMIN 3.9 3.6   Recent Labs    07/18/23 1133 03/25/24 1112 03/29/24 0000  WBC 8.6 4.6 4.4  NEUTROABS  --   --  2,812.00  HGB 14.8 11.0* 10.7*  HCT 45.2 34.3* 33*  MCV 97.2 98.3  --   PLT 206 178 245   Lab Results  Component Value Date   TSH 1.980 07/27/2021   No results found for: HGBA1C Lab Results  Component Value Date   CHOL 165 07/27/2021   HDL 71 07/27/2021    LDLCALC 76 07/27/2021   TRIG 100 07/27/2021   CHOLHDL 2.0 08/04/2017    Significant Diagnostic Results in last 30 days:  No results found.  Assessment/Plan There are no diagnoses linked to this encounter.   Family/ staff Communication: ***  Labs/tests ordered:  ***

## 2024-05-13 LAB — CBC: RBC: 3.6 — AB (ref 3.87–5.11)

## 2024-05-13 LAB — HEPATIC FUNCTION PANEL
ALT: 28 U/L (ref 7–35)
AST: 24 (ref 13–35)
Alkaline Phosphatase: 96 (ref 25–125)
Bilirubin, Total: 0.6

## 2024-05-13 LAB — CBC AND DIFFERENTIAL
HCT: 36 (ref 36–46)
Hemoglobin: 11.1 — AB (ref 12.0–16.0)
Neutrophils Absolute: 2851
Platelets: 195 K/uL (ref 150–400)
WBC: 5.1

## 2024-05-13 LAB — BASIC METABOLIC PANEL WITH GFR
BUN: 15 (ref 4–21)
CO2: 29 — AB (ref 13–22)
Chloride: 107 (ref 99–108)
Creatinine: 0.9 (ref 0.5–1.1)
Glucose: 66
Potassium: 4.6 meq/L (ref 3.5–5.1)
Sodium: 142 (ref 137–147)

## 2024-05-13 LAB — COMPREHENSIVE METABOLIC PANEL WITH GFR
Albumin: 3.3 — AB (ref 3.5–5.0)
Calcium: 8.3 — AB (ref 8.7–10.7)
Globulin: 1.8
eGFR: 65

## 2024-05-31 LAB — COMPREHENSIVE METABOLIC PANEL WITH GFR
Albumin: 3.6 (ref 3.5–5.0)
Calcium: 8.4 — AB (ref 8.7–10.7)
Globulin: 1.8
eGFR: 70

## 2024-05-31 LAB — HEPATIC FUNCTION PANEL
ALT: 21 U/L (ref 7–35)
AST: 21 (ref 13–35)
Alkaline Phosphatase: 90 (ref 25–125)
Bilirubin, Total: 0.7

## 2024-05-31 LAB — BASIC METABOLIC PANEL WITH GFR
BUN: 18 (ref 4–21)
CO2: 28 — AB (ref 13–22)
Chloride: 106 (ref 99–108)
Creatinine: 0.8 (ref 0.5–1.1)
Glucose: 73
Potassium: 4.5 meq/L (ref 3.5–5.1)
Sodium: 140 (ref 137–147)

## 2024-05-31 LAB — LIPID PANEL
Cholesterol: 124 (ref 0–200)
HDL: 56 (ref 35–70)
LDL Cholesterol: 55
Triglycerides: 52 (ref 40–160)

## 2024-05-31 LAB — TSH: TSH: 2.39 (ref 0.41–5.90)

## 2024-06-02 ENCOUNTER — Encounter: Payer: Self-pay | Admitting: Orthopedic Surgery

## 2024-06-02 ENCOUNTER — Non-Acute Institutional Stay (SKILLED_NURSING_FACILITY): Payer: Self-pay | Admitting: Orthopedic Surgery

## 2024-06-02 DIAGNOSIS — F03B18 Unspecified dementia, moderate, with other behavioral disturbance: Secondary | ICD-10-CM

## 2024-06-02 DIAGNOSIS — M25551 Pain in right hip: Secondary | ICD-10-CM | POA: Diagnosis not present

## 2024-06-02 DIAGNOSIS — I480 Paroxysmal atrial fibrillation: Secondary | ICD-10-CM | POA: Diagnosis not present

## 2024-06-02 DIAGNOSIS — M810 Age-related osteoporosis without current pathological fracture: Secondary | ICD-10-CM

## 2024-06-02 DIAGNOSIS — I5032 Chronic diastolic (congestive) heart failure: Secondary | ICD-10-CM | POA: Diagnosis not present

## 2024-06-02 DIAGNOSIS — E7800 Pure hypercholesterolemia, unspecified: Secondary | ICD-10-CM | POA: Diagnosis not present

## 2024-06-02 NOTE — Progress Notes (Signed)
 " Location:  Other [facility].  Nursing Home Room Number: [facility] Place of Service:  SNF [phone]) Provider:  Greig Cluster, NP  PCP: Laurence Locus, DO  Patient Care Team: Laurence Locus, DO as PCP - General (Internal Medicine)  Extended Emergency Contact Information Primary Emergency Contact: [facility] Address: [address]          [place], [place] [address] [place] Mobile Phone: [phone] Relation: Daughter Secondary Emergency Contact: Harlee Masson Address: [address] [place] [address] [place] Mobile Phone: [phone] Relation: Daughter  Code Status:  Full Code.  Goals of care: Advanced Directive information    06/02/2024    9:38 AM  Advanced Directives  Does Patient Have a Medical Advance Directive? No  Does patient want to make changes to medical advance directive? No - Patient declined     Chief Complaint  Patient presents with   Medical Management of Chronic Issues    Medical Management of Chronic Issues.     HPI:  Pt is a 89 y.o. female seen today for medical management of chronic diseases.    She currently resides on the rehab unit at [facility]. PMH: CHF, HLD, bradycardia, MR, PAF, asthma, OA, osteoporosis, macular degeneration and constipation.    PAF- TSH 2.330 07/21/2023, HR controlled with amiodarone  and metoprolol , remains on Eliquis  for clot prevention CHF- LVEF 55% with mild mitral regurgitation 02/2023, remains on furosemide prn HLD- remains on atorvastatin  Dementia- 10/30 CT head noted mild chronic ischemic white matter changes, recent BIMS score 3/15 (11/03), ST consult made last encounter for cognitive testing/evaluation, improved mood, no agitation per staff, not exit seeking Osteoporosis- past use of Prolia > stopped due to cost, Fosamax  recently started, remains on calcium  supplement  Right hip pain/ rectus femoral muscle strain- h/o right hip replacement, 10/30 mechanical fall > MRI  pelvis noted left sided peritrochanteric tendinosis without bursitis, pain resolved, ambulating with wheelchair, no recent falls or injuries  Unable to transfer to AL or memory care. Family plans to apply for medicaid for assistance.   Recent weights:  01/01- 108 lbs  12/03- 107 lbs  11/02- 110.6 lbs   Past Medical History:  Diagnosis Date   Acid reflux 04/13/2009   Arthritis    RIGHT HIP AND HANDS;  DDD   Asthma 08/30/2010   Cancer (HCC)    LOCAL EXCISION SKIN CA FROM NOSE  06/11/13   History of palpitations    YEARS AGO   Impaired hearing    Macular degeneration of both eyes    HX OF INJECTIONS INTO LEFT EYE ( ? AVASTIN  ) FROM OCT 2012 TO OCT 2014.   Osteoporosis    Pure hypercholesterolemia    Rapid palpitations 09/30/2012   Status post right hip replacement 07/06/2013   Unspecified vitamin D deficiency    Past Surgical History:  Procedure Laterality Date   1987 TMJ SURGERY     1996 LEFT KNEE ARTHROSCOPY     ABDOMINAL HYSTERECTOMY  1971   APPENDECTOMY  1964   BACK SURGERY  1993   LUMBAR DISCECTOMY   MOHS SURGERY FOREHEAD FOR BASAL CELL CA  2014   TOTAL HIP ARTHROPLASTY Right 06/30/2013   Procedure: RIGHT TOTAL HIP ARTHROPLASTY ANTERIOR APPROACH;  Surgeon: Dempsey LULLA Moan, MD;  Location: [facility];  Service: Orthopedics;  Laterality: Right;    Allergies[1]  Outpatient Encounter Medications as of 06/02/2024  Medication Sig   alendronate  (FOSAMAX )  70 MG tablet Take 70 mg by mouth once a week. Take with a full glass of water on an empty stomach.   amiodarone  (PACERONE ) 200 MG tablet Take 200 mg by mouth daily.   apixaban  (ELIQUIS ) 2.5 MG TABS tablet Take by mouth 2 (two) times daily.   atorvastatin  (LIPITOR) 10 MG tablet Take 1 tablet (10 mg total) by mouth every morning.   calcium  carbonate (OS-CAL) 600 MG TABS tablet Take 600 mg by mouth 2 (two) times daily with a meal.   Fexofenadine HCl (ALLEGRA PO) Take 1 tablet by mouth daily.   furosemide (LASIX) 20 MG tablet Take 20  mg by mouth daily as needed. For weight gain of 3lbs overnight and 5lbs in a week.   metoprolol  tartrate (LOPRESSOR ) 25 MG tablet Take 25 mg by mouth 2 (two) times daily.   Multiple Vitamins-Minerals (ICAPS PO) Take 1 tablet by mouth 2 (two) times daily.   ondansetron  (ZOFRAN -ODT) 4 MG disintegrating tablet Take 1 tablet (4 mg total) by mouth every 8 (eight) hours as needed for nausea or vomiting.   acetaminophen  (TYLENOL ) 325 MG tablet Take 650 mg by mouth 4 (four) times daily. (Patient not taking: Reported on 06/02/2024)   denosumab  (PROLIA ) 60 MG/ML SOSY injection Inject 60 mg into the skin every 6 (six) months. (Patient not taking: Reported on 06/02/2024)   No facility-administered encounter medications on file as of 06/02/2024.    Review of Systems  Unable to perform ROS: Dementia    Immunization History  Administered Date(s) Administered   Fluad Quad(high Dose 65+) 02/23/2020, 02/07/2021, 02/07/2022   INFLUENZA, HIGH DOSE SEASONAL PF 03/02/2015, 03/09/2016, 02/03/2017, 01/28/2018, 02/09/2019   Pneumococcal Conjugate-13 04/15/2014   Pneumococcal Polysaccharide-23 02/03/2017   Tdap 02/09/2011, 08/01/2023   Unspecified SARS-COV-2 Vaccination 04/05/2022, 02/21/2023, 09/05/2023   Pertinent  Health Maintenance Due  Topic Date Due   Influenza Vaccine  12/26/2023   Bone Density Scan  Completed      04/22/2022    8:15 PM 05/10/2022    9:51 AM 11/11/2022    1:42 PM 03/29/2024   11:37 AM 05/04/2024   12:59 PM  Fall Risk  Falls in the past year?  0 0 1 1  Was there an injury with Fall?  0  0  1  1  Fall Risk Category Calculator  0 0 3 3  Fall Risk Category (Retired)  Low      (RETIRED) Patient Fall Risk Level Low fall risk  Low fall risk      Patient at Risk for Falls Due to  No Fall Risks No Fall Risks History of fall(s);Impaired balance/gait History of fall(s);Impaired balance/gait  Fall risk Follow up   Education provided;Falls prevention discussed Falls evaluation completed Falls  evaluation completed     Data saved with a previous flowsheet row definition   Functional Status Survey:    Vitals:   06/02/24 0922  BP: (!) 140/61  Pulse: (!) 56  Resp: 18  Temp: (!) 97.3 F (36.3 C)  SpO2: 97%  Weight: 108 lb 9.6 oz (49.3 kg)  Height: 5' 5 (1.651 m)   Body mass index is 18.07 kg/m. Physical Exam Vitals reviewed.  Constitutional:      General: She is not in acute distress. HENT:     Head: Normocephalic.     Right Ear: Tympanic membrane normal.     Left Ear: Tympanic membrane normal.     Nose: Nose normal.     Mouth/Throat:     Mouth:  Mucous membranes are moist.  Eyes:     General:        Right eye: No discharge.        Left eye: No discharge.  Cardiovascular:     Rate and Rhythm: Normal rate and regular rhythm.     Pulses: Normal pulses.     Heart sounds: Normal heart sounds.  Pulmonary:     Effort: Pulmonary effort is normal.     Breath sounds: Normal breath sounds.  Abdominal:     General: Bowel sounds are normal. There is no distension.     Palpations: Abdomen is soft.     Tenderness: There is no abdominal tenderness.  Musculoskeletal:     Cervical back: Neck supple.     Right lower leg: No edema.     Left lower leg: No edema.  Skin:    General: Skin is warm.     Capillary Refill: Capillary refill takes less than 2 seconds.  Neurological:     General: No focal deficit present.     Mental Status: She is alert. Mental status is at baseline.     Gait: Gait abnormal.  Psychiatric:        Mood and Affect: Mood normal.     Labs reviewed: Recent Labs    07/18/23 1133 03/25/24 1112 03/29/24 0000 05/13/24 0000 05/31/24 0000  NA 140 136 140 142 140  K 4.4 4.0 4.4 4.6 4.5  CL 105 99 105 107 106  CO2 24 25 29* 29* 28*  GLUCOSE 123* 101*  --   --   --   BUN 26* 18 22* 15 18  CREATININE 0.83 0.95 1.0 0.9 0.8  CALCIUM  8.9 8.6* 8.2* 8.3* 8.4*   Recent Labs    07/18/23 1425 03/25/24 1112 05/13/24 0000 05/31/24 0000  AST 27 33  24 21  ALT 22 29 28 21   ALKPHOS 55 71 96 90  BILITOT 1.0 1.6*  --   --   PROT 6.5 6.9  --   --   ALBUMIN 3.9 3.6 3.3* 3.6   Recent Labs    07/18/23 1133 03/25/24 1112 03/29/24 0000 05/13/24 0000  WBC 8.6 4.6 4.4 5.1  NEUTROABS  --   --  2,812.00 2,851.00  HGB 14.8 11.0* 10.7* 11.1*  HCT 45.2 34.3* 33* 36  MCV 97.2 98.3  --   --   PLT 206 178 245 195   Lab Results  Component Value Date   TSH 2.39 05/31/2024   No results found for: HGBA1C Lab Results  Component Value Date   CHOL 124 05/31/2024   HDL 56 05/31/2024   LDLCALC 55 05/31/2024   TRIG 52 05/31/2024   CHOLHDL 2.0 08/04/2017    Significant Diagnostic Results in last 30 days:  No results found.  Assessment/Plan 1. Paroxysmal atrial fibrillation (HCC) (Primary) - HR< 100 with amiodarone  and metoprolol  - cont Eliquis  for clot prevention   2. Chronic diastolic CHF (congestive heart failure), NYHA class 2 (HCC) - EF 55% - weight stable - lungs clear - no edema - cont furosemide prn  3. Hypercholesterolemia - cont atorvastatin   4. Moderate dementia with other behavioral disturbance, unspecified dementia type (HCC) - improved mood per nursing - followed by ST for cognition  - ambulates with wheelchair - dependent with ADLs except feeding> needs cueing to complete   5. Senile osteoporosis - Prolia  stopped due to cost - now on Fosamax  - cont calcium  supplement   6. Right hip pain - h/o  rectus femoral muscle strain 03/25/2024 - h/o right hip replacement  - denies pain  - cont tylenol  prn     Family/ staff Communication: plan discussed with patient and nurse  Labs/tests ordered:  none        [1]  Allergies Allergen Reactions   Penicillins Swelling   Codeine Itching   Fluticasone Propionate Other (See Comments)    Made the patient's throat raw    "

## 2024-07-01 ENCOUNTER — Encounter: Payer: Self-pay | Admitting: Internal Medicine

## 2024-07-01 ENCOUNTER — Non-Acute Institutional Stay (SKILLED_NURSING_FACILITY): Admitting: Internal Medicine

## 2024-07-01 DIAGNOSIS — G301 Alzheimer's disease with late onset: Secondary | ICD-10-CM

## 2024-07-01 DIAGNOSIS — E7800 Pure hypercholesterolemia, unspecified: Secondary | ICD-10-CM

## 2024-07-01 DIAGNOSIS — I480 Paroxysmal atrial fibrillation: Secondary | ICD-10-CM

## 2024-07-01 DIAGNOSIS — I5032 Chronic diastolic (congestive) heart failure: Secondary | ICD-10-CM

## 2024-07-01 DIAGNOSIS — M810 Age-related osteoporosis without current pathological fracture: Secondary | ICD-10-CM

## 2024-07-01 DIAGNOSIS — F02B0 Dementia in other diseases classified elsewhere, moderate, without behavioral disturbance, psychotic disturbance, mood disturbance, and anxiety: Secondary | ICD-10-CM

## 2024-07-01 DIAGNOSIS — I10 Essential (primary) hypertension: Secondary | ICD-10-CM

## 2024-07-01 NOTE — Assessment & Plan Note (Signed)
 Continue lipitor 10mg  daily

## 2024-07-01 NOTE — Assessment & Plan Note (Signed)
 On fosamax  70 mg qweekly

## 2024-07-01 NOTE — Assessment & Plan Note (Signed)
 On prn lasix for weight gain. On lopressor  25 mg bid.

## 2024-07-01 NOTE — Progress Notes (Signed)
 [facility] Routine Visit Progress Note    Location:  Other [facility]) Nursing Home Room Number: [facility] [address]A Place of Service:  SNF ([phone])   Laurence Locus, DO   Patient Care Team: Laurence Locus, DO as PCP - General (Internal Medicine)   Extended Emergency Contact Information Primary Emergency Contact: Darlene Serrano Address: [address]          [place], [place] [address] [place] Mobile Phone: [phone] Relation: Daughter Secondary Emergency Contact: Darlene Serrano Address: [address] [place] [address] [place] Mobile Phone: [phone] Relation: Daughter   Goals of care: Advanced Directive information    06/02/2024    9:38 AM  Advanced Directives  Does Patient Have a Medical Advance Directive? No  Does patient want to make changes to medical advance directive? No - Patient declined    CODE STATUS: Full Code   Chief Complaint  Patient presents with   Routine Visit     HPI: Pt is a 89 y.o. female seen today for medical management of chronic disease.   [name] is seen today for routine medical care.  She seems frustrated she cannot find the right channel on her TV to watch.  She was initially mid to [facility] back in October 2025 due to a fall she sustained at home.  She was not admitted to the [facility] but was seen only in the ER.  She was seen by physical therapy at the ER and was felt to need acute rehab.  She was admitted to [facility] on March 26, 2024.  She has since completed rehab.  She does have dementia.  Family felt that she was unable to go back to her independent living.  She is now a long-term care resident.  Nursing without any acute issues or concerns.   Past Medical History:  Diagnosis Date   Acid reflux 04/13/2009   Arthritis    RIGHT HIP AND HANDS;  DDD   Asthma 08/30/2010   Cancer (HCC)    LOCAL EXCISION SKIN CA FROM NOSE  06/11/13   History of palpitations    YEARS AGO    History of total hip replacement, right 08/29/2017   Impaired hearing    Macular degeneration of both eyes    HX OF INJECTIONS INTO LEFT EYE ( ? AVASTIN  ) FROM OCT 2012 TO OCT 2014.   Osteoporosis    Pure hypercholesterolemia    Rapid palpitations 09/30/2012   Status post right hip replacement 07/06/2013   Unspecified vitamin D deficiency    Past Surgical History:  Procedure Laterality Date   1987 TMJ SURGERY     1996 LEFT KNEE ARTHROSCOPY     ABDOMINAL HYSTERECTOMY  1971   APPENDECTOMY  1964   BACK SURGERY  1993   LUMBAR DISCECTOMY   MOHS SURGERY FOREHEAD FOR BASAL CELL CA  2014   TOTAL HIP ARTHROPLASTY Right 06/30/2013   Procedure: RIGHT TOTAL HIP ARTHROPLASTY ANTERIOR APPROACH;  Surgeon: Dempsey LULLA Moan, MD;  Location: [facility];  Service: Orthopedics;  Laterality: Right;     Allergies[1]   Outpatient Encounter Medications as of 07/01/2024  Medication Sig   alendronate  (FOSAMAX ) 70 MG tablet Take 70 mg by mouth once a week. Take with a full glass of water on an empty stomach.   amiodarone  (PACERONE ) 200 MG tablet Take 200 mg by mouth daily.   apixaban  (ELIQUIS ) 2.5 MG TABS tablet  Take by mouth 2 (two) times daily.   atorvastatin  (LIPITOR) 10 MG tablet Take 1 tablet (10 mg total) by mouth every morning.   calcium  carbonate (OS-CAL) 600 MG TABS tablet Take 600 mg by mouth 2 (two) times daily with a meal.   Cholecalciferol 50 MCG (2000 UT) TABS Take 1 tablet by mouth daily. Give 1 tablet by mouth one time a day for supplement per pharmacy recommendation   Fexofenadine HCl (ALLEGRA PO) Take 1 tablet by mouth daily.   furosemide (LASIX) 20 MG tablet Take 20 mg by mouth daily as needed. For weight gain of 3lbs overnight and 5lbs in a week.   metoprolol  tartrate (LOPRESSOR ) 25 MG tablet Take 25 mg by mouth 2 (two) times daily.   Multiple Vitamins-Minerals (ICAPS PO) Take 1 tablet by mouth 2 (two) times daily.   ondansetron  (ZOFRAN -ODT) 4 MG disintegrating tablet Take 1 tablet (4 mg total)  by mouth every 8 (eight) hours as needed for nausea or vomiting.   polyethylene glycol (MIRALAX  / GLYCOLAX ) 17 g packet Take 17 g by mouth every 12 (twelve) hours as needed. Give 17 gram by mouth every 12 hours as needed for Constipation   No facility-administered encounter medications on file as of 07/01/2024.     Review of Systems  Unable to perform ROS: Dementia      Immunization History  Administered Date(s) Administered   Fluad Quad(high Dose 65+) 02/23/2020, 02/07/2021, 02/07/2022   INFLUENZA, HIGH DOSE SEASONAL PF 03/02/2015, 03/09/2016, 02/03/2017, 01/28/2018, 02/09/2019   Pneumococcal Conjugate-13 04/15/2014   Pneumococcal Polysaccharide-23 02/03/2017   Tdap 02/09/2011, 08/01/2023   Unspecified SARS-COV-2 Vaccination 04/05/2022, 02/21/2023, 09/05/2023   Pertinent  Health Maintenance Due  Topic Date Due   Influenza Vaccine  12/26/2023   Bone Density Scan  Completed      05/10/2022    9:51 AM 11/11/2022    1:42 PM 03/29/2024   11:37 AM 05/04/2024   12:59 PM 06/02/2024    1:02 PM  Fall Risk  Falls in the past year? 0 0 1 1 1   Was there an injury with Fall? 0  0  1  1 1   Fall Risk Category Calculator 0 0 3 3 3   Fall Risk Category (Retired) Low       (RETIRED) Patient Fall Risk Level Low fall risk       Patient at Risk for Falls Due to No Fall Risks No Fall Risks History of fall(s);Impaired balance/gait History of fall(s);Impaired balance/gait History of fall(s);Impaired balance/gait  Fall risk Follow up  Education provided;Falls prevention discussed Falls evaluation completed Falls evaluation completed Falls evaluation completed     Data saved with a previous flowsheet row definition   Functional Status Survey:     Vitals:   07/01/24 1004 07/01/24 1011  BP: (!) 155/60 (!) 145/80  Pulse: 97 63  Resp:  18  Temp:  (!) 97.3 F (36.3 C)  SpO2:  98%  Weight:  108 lb (49 kg)  Height:  5' 5 (1.651 m)   Body mass index is 17.97 kg/m. Physical Exam Vitals and nursing  note reviewed.  Constitutional:      General: She is not in acute distress.    Appearance: She is normal weight. She is not toxic-appearing or diaphoretic.  HENT:     Head: Normocephalic and atraumatic.  Cardiovascular:     Rate and Rhythm: Normal rate and regular rhythm.  Pulmonary:     Effort: Pulmonary effort is normal.     Breath  sounds: Normal breath sounds.  Abdominal:     General: Abdomen is flat. Bowel sounds are normal.     Palpations: Abdomen is soft.  Musculoskeletal:     Right lower leg: No edema.     Left lower leg: No edema.  Skin:    General: Skin is warm and dry.     Capillary Refill: Capillary refill takes less than 2 seconds.  Neurological:     Mental Status: She is alert.     Comments: Not oriented to year.  She knows that she is in rehab.      Labs reviewed: Recent Labs    07/18/23 1133 03/25/24 1112 03/29/24 0000 05/13/24 0000 05/31/24 0000  NA 140 136 140 142 140  K 4.4 4.0 4.4 4.6 4.5  CL 105 99 105 107 106  CO2 24 25 29* 29* 28*  GLUCOSE 123* 101*  --   --   --   BUN 26* 18 22* 15 18  CREATININE 0.83 0.95 1.0 0.9 0.8  CALCIUM  8.9 8.6* 8.2* 8.3* 8.4*   Recent Labs    07/18/23 1425 03/25/24 1112 05/13/24 0000 05/31/24 0000  AST 27 33 24 21  ALT 22 29 28 21   ALKPHOS 55 71 96 90  BILITOT 1.0 1.6*  --   --   PROT 6.5 6.9  --   --   ALBUMIN 3.9 3.6 3.3* 3.6   Recent Labs    07/18/23 1133 03/25/24 1112 03/29/24 0000 05/13/24 0000  WBC 8.6 4.6 4.4 5.1  NEUTROABS  --   --  2,812.00 2,851.00  HGB 14.8 11.0* 10.7* 11.1*  HCT 45.2 34.3* 33* 36  MCV 97.2 98.3  --   --   PLT 206 178 245 195   Lab Results  Component Value Date   TSH 2.39 05/31/2024   No results found for: HGBA1C Lab Results  Component Value Date   CHOL 124 05/31/2024   HDL 56 05/31/2024   LDLCALC 55 05/31/2024   TRIG 52 05/31/2024   CHOLHDL 2.0 08/04/2017     Assessment/Plan Moderate late onset Alzheimer's dementia without behavioral disturbance, psychotic  disturbance, mood disturbance, or anxiety (HCC) She is now a long term care resident. She will not be returning to independent or assisted living. Not on any behavioral meds.  Hypercholesterolemia Continue lipitor 10 mg daily  Essential hypertension On lopressor  25 mg bid,   Paroxysmal atrial fibrillation (HCC) On eliquis  5 mg bid, lopressor  25 mg bid, amiodarone  200 mg daily  Chronic diastolic CHF (congestive heart failure), NYHA class 2 (HCC) On prn lasix for weight gain. On lopressor  25 mg bid.  Age related osteoporosis On fosamax  70 mg qweekly    Bristyn Kulesza, DO [facility] [phone]     [1]  Allergies Allergen Reactions   Penicillins Swelling   Codeine Itching   Fluticasone Propionate Other (See Comments)    Made the patient's throat raw

## 2024-07-01 NOTE — Assessment & Plan Note (Addendum)
 On lopressor  25 mg bid,

## 2024-07-01 NOTE — Assessment & Plan Note (Addendum)
 On eliquis  5 mg bid, lopressor  25 mg bid, amiodarone  200 mg daily

## 2024-07-01 NOTE — Assessment & Plan Note (Signed)
 She is now a long term care resident. She will not be returning to independent or assisted living. Not on any behavioral meds.
